# Patient Record
Sex: Female | Born: 1940
Health system: Southern US, Community
[De-identification: ages and names within clinical notes are randomized; demographics above are authoritative.]

## PROBLEM LIST (undated history)

## (undated) DIAGNOSIS — E039 Hypothyroidism, unspecified: Secondary | ICD-10-CM

## (undated) DIAGNOSIS — E785 Hyperlipidemia, unspecified: Secondary | ICD-10-CM

## (undated) DIAGNOSIS — K7581 Nonalcoholic steatohepatitis (NASH): Secondary | ICD-10-CM

## (undated) DIAGNOSIS — C50919 Malignant neoplasm of unspecified site of unspecified female breast: Secondary | ICD-10-CM

## (undated) DIAGNOSIS — I10 Essential (primary) hypertension: Secondary | ICD-10-CM

## (undated) DIAGNOSIS — Z973 Presence of spectacles and contact lenses: Secondary | ICD-10-CM

## (undated) DIAGNOSIS — Z862 Personal history of diseases of the blood and blood-forming organs and certain disorders involving the immune mechanism: Secondary | ICD-10-CM

## (undated) DIAGNOSIS — M8580 Other specified disorders of bone density and structure, unspecified site: Secondary | ICD-10-CM

## (undated) DIAGNOSIS — K219 Gastro-esophageal reflux disease without esophagitis: Secondary | ICD-10-CM

## (undated) DIAGNOSIS — Z9221 Personal history of antineoplastic chemotherapy: Secondary | ICD-10-CM

## (undated) DIAGNOSIS — M797 Fibromyalgia: Secondary | ICD-10-CM

## (undated) DIAGNOSIS — Z923 Personal history of irradiation: Secondary | ICD-10-CM

## (undated) DIAGNOSIS — Z8619 Personal history of other infectious and parasitic diseases: Secondary | ICD-10-CM

## (undated) DIAGNOSIS — K635 Polyp of colon: Secondary | ICD-10-CM

## (undated) DIAGNOSIS — K589 Irritable bowel syndrome without diarrhea: Secondary | ICD-10-CM

## (undated) DIAGNOSIS — M1990 Unspecified osteoarthritis, unspecified site: Secondary | ICD-10-CM

## (undated) DIAGNOSIS — F419 Anxiety disorder, unspecified: Secondary | ICD-10-CM

## (undated) HISTORY — DX: Unspecified osteoarthritis, unspecified site: M19.90

## (undated) HISTORY — DX: Polyp of colon: K63.5

## (undated) HISTORY — DX: Other specified disorders of bone density and structure, unspecified site: M85.80

## (undated) HISTORY — DX: Hyperlipidemia, unspecified: E78.5

## (undated) HISTORY — DX: Irritable bowel syndrome, unspecified: K58.9

## (undated) HISTORY — DX: Anxiety disorder, unspecified: F41.9

## (undated) HISTORY — DX: Malignant neoplasm of unspecified site of unspecified female breast: C50.919

## (undated) HISTORY — DX: Fibromyalgia: M79.7

## (undated) HISTORY — DX: Gastro-esophageal reflux disease without esophagitis: K21.9

## (undated) HISTORY — DX: Personal history of diseases of the blood and blood-forming organs and certain disorders involving the immune mechanism: Z86.2

## (undated) HISTORY — PX: APPENDECTOMY: SHX54

## (undated) HISTORY — PX: ABDOMINAL HYSTERECTOMY: SHX81

## (undated) HISTORY — DX: Personal history of other infectious and parasitic diseases: Z86.19

## (undated) HISTORY — DX: Nonalcoholic steatohepatitis (NASH): K75.81

## (undated) HISTORY — DX: Hypothyroidism, unspecified: E03.9

## (undated) HISTORY — PX: LAPAROSCOPIC CHOLECYSTECTOMY: SUR755

## (undated) HISTORY — PX: ROTATOR CUFF REPAIR: SHX139

## (undated) HISTORY — DX: Essential (primary) hypertension: I10

---

## 1976-09-29 HISTORY — PX: BILATERAL SALPINGOOPHORECTOMY: SHX1223

## 1998-03-30 ENCOUNTER — Ambulatory Visit (HOSPITAL_COMMUNITY): Admission: RE | Admit: 1998-03-30 | Discharge: 1998-03-30 | Payer: Self-pay | Admitting: Gastroenterology

## 1999-04-23 ENCOUNTER — Ambulatory Visit (HOSPITAL_COMMUNITY): Admission: RE | Admit: 1999-04-23 | Discharge: 1999-04-23 | Payer: Self-pay | Admitting: Gastroenterology

## 1999-05-01 ENCOUNTER — Encounter: Payer: Self-pay | Admitting: Gastroenterology

## 1999-05-01 ENCOUNTER — Ambulatory Visit (HOSPITAL_COMMUNITY): Admission: RE | Admit: 1999-05-01 | Discharge: 1999-05-01 | Payer: Self-pay | Admitting: Gastroenterology

## 1999-07-08 ENCOUNTER — Encounter: Admission: RE | Admit: 1999-07-08 | Discharge: 1999-10-06 | Payer: Self-pay | Admitting: Specialist

## 1999-08-16 ENCOUNTER — Encounter: Admission: RE | Admit: 1999-08-16 | Discharge: 1999-08-16 | Payer: Self-pay | Admitting: Obstetrics and Gynecology

## 1999-12-11 ENCOUNTER — Other Ambulatory Visit: Admission: RE | Admit: 1999-12-11 | Discharge: 1999-12-11 | Payer: Self-pay | Admitting: Obstetrics and Gynecology

## 2000-11-27 ENCOUNTER — Encounter: Admission: RE | Admit: 2000-11-27 | Discharge: 2000-11-27 | Payer: Self-pay | Admitting: Obstetrics and Gynecology

## 2000-11-27 ENCOUNTER — Encounter: Payer: Self-pay | Admitting: Obstetrics and Gynecology

## 2001-02-11 ENCOUNTER — Encounter: Payer: Self-pay | Admitting: Gastroenterology

## 2001-02-11 ENCOUNTER — Ambulatory Visit (HOSPITAL_COMMUNITY): Admission: RE | Admit: 2001-02-11 | Discharge: 2001-02-11 | Payer: Self-pay | Admitting: Gastroenterology

## 2001-02-24 ENCOUNTER — Encounter (INDEPENDENT_AMBULATORY_CARE_PROVIDER_SITE_OTHER): Payer: Self-pay | Admitting: Specialist

## 2001-02-24 ENCOUNTER — Encounter: Payer: Self-pay | Admitting: Internal Medicine

## 2001-02-24 ENCOUNTER — Ambulatory Visit (HOSPITAL_COMMUNITY): Admission: RE | Admit: 2001-02-24 | Discharge: 2001-02-24 | Payer: Self-pay | Admitting: Gastroenterology

## 2001-03-26 ENCOUNTER — Other Ambulatory Visit: Admission: RE | Admit: 2001-03-26 | Discharge: 2001-03-26 | Payer: Self-pay | Admitting: Obstetrics and Gynecology

## 2001-12-21 ENCOUNTER — Encounter: Payer: Self-pay | Admitting: Specialist

## 2001-12-24 ENCOUNTER — Observation Stay (HOSPITAL_COMMUNITY): Admission: RE | Admit: 2001-12-24 | Discharge: 2001-12-25 | Payer: Self-pay | Admitting: Specialist

## 2002-03-11 ENCOUNTER — Encounter: Payer: Self-pay | Admitting: Obstetrics and Gynecology

## 2002-03-11 ENCOUNTER — Encounter: Admission: RE | Admit: 2002-03-11 | Discharge: 2002-03-11 | Payer: Self-pay | Admitting: Obstetrics and Gynecology

## 2003-02-15 ENCOUNTER — Encounter: Payer: Self-pay | Admitting: Pulmonary Disease

## 2003-02-15 ENCOUNTER — Ambulatory Visit (HOSPITAL_COMMUNITY): Admission: RE | Admit: 2003-02-15 | Discharge: 2003-02-15 | Payer: Self-pay | Admitting: Pulmonary Disease

## 2003-02-17 ENCOUNTER — Ambulatory Visit (HOSPITAL_COMMUNITY): Admission: RE | Admit: 2003-02-17 | Discharge: 2003-02-17 | Payer: Self-pay | Admitting: Pulmonary Disease

## 2003-02-17 ENCOUNTER — Encounter: Payer: Self-pay | Admitting: Pulmonary Disease

## 2003-03-03 ENCOUNTER — Encounter: Admission: RE | Admit: 2003-03-03 | Discharge: 2003-03-03 | Payer: Self-pay | Admitting: Obstetrics and Gynecology

## 2003-03-03 ENCOUNTER — Encounter: Payer: Self-pay | Admitting: Obstetrics and Gynecology

## 2003-03-10 ENCOUNTER — Observation Stay (HOSPITAL_COMMUNITY): Admission: RE | Admit: 2003-03-10 | Discharge: 2003-03-11 | Payer: Self-pay | Admitting: General Surgery

## 2003-03-10 ENCOUNTER — Encounter: Payer: Self-pay | Admitting: General Surgery

## 2003-03-10 ENCOUNTER — Encounter (INDEPENDENT_AMBULATORY_CARE_PROVIDER_SITE_OTHER): Payer: Self-pay | Admitting: *Deleted

## 2003-09-30 DIAGNOSIS — Z9221 Personal history of antineoplastic chemotherapy: Secondary | ICD-10-CM

## 2003-09-30 DIAGNOSIS — Z923 Personal history of irradiation: Secondary | ICD-10-CM

## 2003-09-30 HISTORY — DX: Personal history of irradiation: Z92.3

## 2003-09-30 HISTORY — PX: BREAST LUMPECTOMY: SHX2

## 2003-09-30 HISTORY — DX: Personal history of antineoplastic chemotherapy: Z92.21

## 2003-11-30 ENCOUNTER — Encounter: Payer: Self-pay | Admitting: Internal Medicine

## 2004-03-15 ENCOUNTER — Encounter: Admission: RE | Admit: 2004-03-15 | Discharge: 2004-03-15 | Payer: Self-pay | Admitting: Obstetrics and Gynecology

## 2004-03-19 ENCOUNTER — Encounter (INDEPENDENT_AMBULATORY_CARE_PROVIDER_SITE_OTHER): Payer: Self-pay | Admitting: *Deleted

## 2004-03-19 ENCOUNTER — Encounter: Admission: RE | Admit: 2004-03-19 | Discharge: 2004-03-19 | Payer: Self-pay | Admitting: Obstetrics and Gynecology

## 2004-03-26 ENCOUNTER — Encounter (HOSPITAL_COMMUNITY): Admission: RE | Admit: 2004-03-26 | Discharge: 2004-06-24 | Payer: Self-pay | Admitting: General Surgery

## 2004-04-03 ENCOUNTER — Encounter: Admission: RE | Admit: 2004-04-03 | Discharge: 2004-04-03 | Payer: Self-pay | Admitting: General Surgery

## 2004-04-05 ENCOUNTER — Encounter (INDEPENDENT_AMBULATORY_CARE_PROVIDER_SITE_OTHER): Payer: Self-pay | Admitting: Specialist

## 2004-04-05 ENCOUNTER — Ambulatory Visit (HOSPITAL_BASED_OUTPATIENT_CLINIC_OR_DEPARTMENT_OTHER): Admission: RE | Admit: 2004-04-05 | Discharge: 2004-04-05 | Payer: Self-pay | Admitting: General Surgery

## 2004-04-05 ENCOUNTER — Ambulatory Visit (HOSPITAL_COMMUNITY): Admission: RE | Admit: 2004-04-05 | Discharge: 2004-04-05 | Payer: Self-pay | Admitting: General Surgery

## 2004-04-05 ENCOUNTER — Encounter: Admission: RE | Admit: 2004-04-05 | Discharge: 2004-04-05 | Payer: Self-pay | Admitting: General Surgery

## 2004-04-22 ENCOUNTER — Ambulatory Visit (HOSPITAL_COMMUNITY): Admission: RE | Admit: 2004-04-22 | Discharge: 2004-04-22 | Payer: Self-pay | Admitting: Oncology

## 2004-04-23 ENCOUNTER — Ambulatory Visit: Admission: RE | Admit: 2004-04-23 | Discharge: 2004-06-12 | Payer: Self-pay | Admitting: Radiation Oncology

## 2004-04-30 ENCOUNTER — Ambulatory Visit (HOSPITAL_COMMUNITY): Admission: RE | Admit: 2004-04-30 | Discharge: 2004-04-30 | Payer: Self-pay | Admitting: Cardiovascular Disease

## 2004-05-13 ENCOUNTER — Ambulatory Visit (HOSPITAL_BASED_OUTPATIENT_CLINIC_OR_DEPARTMENT_OTHER): Admission: RE | Admit: 2004-05-13 | Discharge: 2004-05-13 | Payer: Self-pay | Admitting: General Surgery

## 2004-05-13 ENCOUNTER — Ambulatory Visit (HOSPITAL_COMMUNITY): Admission: RE | Admit: 2004-05-13 | Discharge: 2004-05-13 | Payer: Self-pay | Admitting: General Surgery

## 2004-07-29 ENCOUNTER — Ambulatory Visit: Payer: Self-pay | Admitting: Oncology

## 2004-08-08 ENCOUNTER — Ambulatory Visit: Payer: Self-pay | Admitting: Cardiovascular Disease

## 2004-08-08 ENCOUNTER — Ambulatory Visit (HOSPITAL_COMMUNITY): Admission: RE | Admit: 2004-08-08 | Discharge: 2004-08-08 | Payer: Self-pay | Admitting: Cardiovascular Disease

## 2004-08-12 ENCOUNTER — Ambulatory Visit: Admission: RE | Admit: 2004-08-12 | Discharge: 2004-10-25 | Payer: Self-pay | Admitting: Radiation Oncology

## 2004-09-02 ENCOUNTER — Ambulatory Visit: Payer: Self-pay | Admitting: Pulmonary Disease

## 2004-09-09 ENCOUNTER — Ambulatory Visit: Payer: Self-pay | Admitting: Pulmonary Disease

## 2004-10-01 ENCOUNTER — Ambulatory Visit: Payer: Self-pay | Admitting: Oncology

## 2004-10-24 ENCOUNTER — Ambulatory Visit: Admission: RE | Admit: 2004-10-24 | Discharge: 2004-10-24 | Payer: Self-pay | Admitting: Oncology

## 2004-10-24 ENCOUNTER — Encounter (INDEPENDENT_AMBULATORY_CARE_PROVIDER_SITE_OTHER): Payer: Self-pay | Admitting: *Deleted

## 2004-11-01 ENCOUNTER — Ambulatory Visit: Payer: Self-pay | Admitting: Pulmonary Disease

## 2004-11-15 ENCOUNTER — Ambulatory Visit: Payer: Self-pay | Admitting: Internal Medicine

## 2004-11-18 ENCOUNTER — Ambulatory Visit: Payer: Self-pay | Admitting: Internal Medicine

## 2004-11-21 ENCOUNTER — Ambulatory Visit: Admission: RE | Admit: 2004-11-21 | Discharge: 2004-11-21 | Payer: Self-pay | Admitting: Radiation Oncology

## 2004-12-02 ENCOUNTER — Ambulatory Visit: Payer: Self-pay | Admitting: Oncology

## 2005-01-30 ENCOUNTER — Ambulatory Visit: Payer: Self-pay | Admitting: Oncology

## 2005-03-17 ENCOUNTER — Encounter: Admission: RE | Admit: 2005-03-17 | Discharge: 2005-03-17 | Payer: Self-pay | Admitting: General Surgery

## 2005-03-17 ENCOUNTER — Ambulatory Visit: Payer: Self-pay | Admitting: Oncology

## 2005-04-24 ENCOUNTER — Ambulatory Visit (HOSPITAL_BASED_OUTPATIENT_CLINIC_OR_DEPARTMENT_OTHER): Admission: RE | Admit: 2005-04-24 | Discharge: 2005-04-24 | Payer: Self-pay | Admitting: General Surgery

## 2005-05-26 ENCOUNTER — Ambulatory Visit: Payer: Self-pay | Admitting: Oncology

## 2005-05-28 ENCOUNTER — Ambulatory Visit: Admission: RE | Admit: 2005-05-28 | Discharge: 2005-05-28 | Payer: Self-pay | Admitting: Oncology

## 2005-05-28 ENCOUNTER — Ambulatory Visit: Payer: Self-pay | Admitting: Cardiovascular Disease

## 2005-05-28 ENCOUNTER — Encounter: Payer: Self-pay | Admitting: Cardiology

## 2005-07-11 ENCOUNTER — Ambulatory Visit: Payer: Self-pay | Admitting: Oncology

## 2005-10-08 ENCOUNTER — Ambulatory Visit: Payer: Self-pay | Admitting: Oncology

## 2005-11-24 ENCOUNTER — Ambulatory Visit: Payer: Self-pay | Admitting: Pulmonary Disease

## 2006-01-08 ENCOUNTER — Ambulatory Visit: Payer: Self-pay | Admitting: Oncology

## 2006-01-08 LAB — CBC WITH DIFFERENTIAL/PLATELET
Basophils Absolute: 0.1 10*3/uL (ref 0.0–0.1)
Eosinophils Absolute: 0.2 10*3/uL (ref 0.0–0.5)
HCT: 40 % (ref 34.8–46.6)
HGB: 13.6 g/dL (ref 11.6–15.9)
MCV: 84 fL (ref 81.0–101.0)
NEUT#: 7 10*3/uL — ABNORMAL HIGH (ref 1.5–6.5)
RDW: 14.3 % (ref 11.3–14.5)
lymph#: 2.6 10*3/uL (ref 0.9–3.3)

## 2006-03-19 ENCOUNTER — Encounter: Admission: RE | Admit: 2006-03-19 | Discharge: 2006-03-19 | Payer: Self-pay | Admitting: Oncology

## 2006-07-02 ENCOUNTER — Ambulatory Visit: Payer: Self-pay | Admitting: Oncology

## 2006-07-06 LAB — CBC WITH DIFFERENTIAL/PLATELET
Basophils Absolute: 0.1 10*3/uL (ref 0.0–0.1)
EOS%: 1.5 % (ref 0.0–7.0)
HCT: 37.5 % (ref 34.8–46.6)
HGB: 12.9 g/dL (ref 11.6–15.9)
LYMPH%: 21.3 % (ref 14.0–48.0)
MCH: 28.8 pg (ref 26.0–34.0)
MCHC: 34.3 g/dL (ref 32.0–36.0)
MCV: 84 fL (ref 81.0–101.0)
MONO%: 9 % (ref 0.0–13.0)
NEUT%: 67.3 % (ref 39.6–76.8)
Platelets: 321 10*3/uL (ref 145–400)
lymph#: 1.9 10*3/uL (ref 0.9–3.3)

## 2006-07-06 LAB — COMPREHENSIVE METABOLIC PANEL
AST: 130 U/L — ABNORMAL HIGH (ref 0–37)
BUN: 12 mg/dL (ref 6–23)
Calcium: 8.8 mg/dL (ref 8.4–10.5)
Chloride: 105 mEq/L (ref 96–112)
Creatinine, Ser: 0.73 mg/dL (ref 0.40–1.20)
Total Bilirubin: 0.5 mg/dL (ref 0.3–1.2)

## 2006-07-17 ENCOUNTER — Encounter: Admission: RE | Admit: 2006-07-17 | Discharge: 2006-07-17 | Payer: Self-pay | Admitting: Oncology

## 2006-10-13 ENCOUNTER — Ambulatory Visit: Payer: Self-pay | Admitting: Pulmonary Disease

## 2006-10-13 LAB — CONVERTED CEMR LAB
AST: 105 units/L — ABNORMAL HIGH (ref 0–37)
Albumin: 3.8 g/dL (ref 3.5–5.2)
Alkaline Phosphatase: 56 units/L (ref 39–117)
BUN: 14 mg/dL (ref 6–23)
Basophils Absolute: 0 10*3/uL (ref 0.0–0.1)
Basophils Relative: 0.4 % (ref 0.0–1.0)
CO2: 24 meq/L (ref 19–32)
Calcium: 9.5 mg/dL (ref 8.4–10.5)
Direct LDL: 141.5 mg/dL
Eosinophils Relative: 1 % (ref 0.0–5.0)
HCT: 39.2 % (ref 36.0–46.0)
Hemoglobin: 13.5 g/dL (ref 12.0–15.0)
Lymphocytes Relative: 18.1 % (ref 12.0–46.0)
Neutrophils Relative %: 75.9 % (ref 43.0–77.0)
Potassium: 3.5 meq/L (ref 3.5–5.1)
RDW: 13.2 % (ref 11.5–14.6)
TSH: 6.67 microintl units/mL — ABNORMAL HIGH (ref 0.35–5.50)
Total Bilirubin: 1.1 mg/dL (ref 0.3–1.2)
Total Protein: 7.6 g/dL (ref 6.0–8.3)
Triglycerides: 215 mg/dL (ref 0–149)
Vit D, 1,25-Dihydroxy: 47 (ref 20–57)
WBC: 10.7 10*3/uL — ABNORMAL HIGH (ref 4.5–10.5)

## 2006-10-23 ENCOUNTER — Ambulatory Visit: Payer: Self-pay | Admitting: Internal Medicine

## 2006-10-27 ENCOUNTER — Ambulatory Visit: Payer: Self-pay | Admitting: Gastroenterology

## 2006-11-11 ENCOUNTER — Encounter (INDEPENDENT_AMBULATORY_CARE_PROVIDER_SITE_OTHER): Payer: Self-pay | Admitting: Specialist

## 2006-11-11 ENCOUNTER — Ambulatory Visit (HOSPITAL_COMMUNITY): Admission: RE | Admit: 2006-11-11 | Discharge: 2006-11-11 | Payer: Self-pay | Admitting: Internal Medicine

## 2006-11-11 ENCOUNTER — Encounter: Payer: Self-pay | Admitting: Internal Medicine

## 2006-12-01 ENCOUNTER — Ambulatory Visit: Payer: Self-pay | Admitting: Internal Medicine

## 2006-12-01 LAB — CONVERTED CEMR LAB
Albumin: 3.5 g/dL (ref 3.5–5.2)
TSH: 3.93 microintl units/mL (ref 0.35–5.50)
Total Bilirubin: 0.5 mg/dL (ref 0.3–1.2)

## 2007-01-13 ENCOUNTER — Ambulatory Visit: Payer: Self-pay | Admitting: Internal Medicine

## 2007-01-13 LAB — CONVERTED CEMR LAB
AST: 105 units/L — ABNORMAL HIGH (ref 0–37)
Albumin: 3.4 g/dL — ABNORMAL LOW (ref 3.5–5.2)
Alkaline Phosphatase: 56 units/L (ref 39–117)

## 2007-02-25 ENCOUNTER — Ambulatory Visit: Payer: Self-pay | Admitting: Oncology

## 2007-03-01 LAB — COMPREHENSIVE METABOLIC PANEL
ALT: 99 U/L — ABNORMAL HIGH (ref 0–35)
AST: 119 U/L — ABNORMAL HIGH (ref 0–37)
Albumin: 4.2 g/dL (ref 3.5–5.2)
Alkaline Phosphatase: 55 U/L (ref 39–117)
Potassium: 4.2 mEq/L (ref 3.5–5.3)
Sodium: 140 mEq/L (ref 135–145)
Total Protein: 7.2 g/dL (ref 6.0–8.3)

## 2007-03-01 LAB — CBC WITH DIFFERENTIAL/PLATELET
Basophils Absolute: 0 10*3/uL (ref 0.0–0.1)
Eosinophils Absolute: 0.2 10*3/uL (ref 0.0–0.5)
HGB: 12.8 g/dL (ref 11.6–15.9)
LYMPH%: 24.1 % (ref 14.0–48.0)
MCV: 83 fL (ref 81.0–101.0)
MONO%: 9.4 % (ref 0.0–13.0)
NEUT#: 5.4 10*3/uL (ref 1.5–6.5)
NEUT%: 64 % (ref 39.6–76.8)
Platelets: 318 10*3/uL (ref 145–400)

## 2007-03-22 ENCOUNTER — Encounter: Admission: RE | Admit: 2007-03-22 | Discharge: 2007-03-22 | Payer: Self-pay | Admitting: Pulmonary Disease

## 2007-09-17 DIAGNOSIS — IMO0001 Reserved for inherently not codable concepts without codable children: Secondary | ICD-10-CM | POA: Insufficient documentation

## 2007-09-17 DIAGNOSIS — I10 Essential (primary) hypertension: Secondary | ICD-10-CM | POA: Insufficient documentation

## 2007-09-17 DIAGNOSIS — F411 Generalized anxiety disorder: Secondary | ICD-10-CM | POA: Insufficient documentation

## 2007-09-17 DIAGNOSIS — K219 Gastro-esophageal reflux disease without esophagitis: Secondary | ICD-10-CM | POA: Insufficient documentation

## 2007-09-17 DIAGNOSIS — E785 Hyperlipidemia, unspecified: Secondary | ICD-10-CM | POA: Insufficient documentation

## 2007-09-17 DIAGNOSIS — K589 Irritable bowel syndrome without diarrhea: Secondary | ICD-10-CM | POA: Insufficient documentation

## 2007-10-21 ENCOUNTER — Ambulatory Visit: Payer: Self-pay | Admitting: Pulmonary Disease

## 2007-10-21 DIAGNOSIS — M899 Disorder of bone, unspecified: Secondary | ICD-10-CM | POA: Insufficient documentation

## 2007-10-21 DIAGNOSIS — C50919 Malignant neoplasm of unspecified site of unspecified female breast: Secondary | ICD-10-CM | POA: Insufficient documentation

## 2007-10-21 DIAGNOSIS — M1990 Unspecified osteoarthritis, unspecified site: Secondary | ICD-10-CM | POA: Insufficient documentation

## 2007-10-21 DIAGNOSIS — D649 Anemia, unspecified: Secondary | ICD-10-CM | POA: Insufficient documentation

## 2007-10-21 DIAGNOSIS — Z853 Personal history of malignant neoplasm of breast: Secondary | ICD-10-CM | POA: Insufficient documentation

## 2007-10-21 DIAGNOSIS — M949 Disorder of cartilage, unspecified: Secondary | ICD-10-CM

## 2007-10-21 DIAGNOSIS — E039 Hypothyroidism, unspecified: Secondary | ICD-10-CM | POA: Insufficient documentation

## 2007-10-21 HISTORY — DX: Malignant neoplasm of unspecified site of unspecified female breast: C50.919

## 2007-10-29 LAB — CONVERTED CEMR LAB
ALT: 109 units/L — ABNORMAL HIGH (ref 0–35)
AST: 120 units/L — ABNORMAL HIGH (ref 0–37)
Alkaline Phosphatase: 57 units/L (ref 39–117)
BUN: 12 mg/dL (ref 6–23)
Basophils Relative: 0.2 % (ref 0.0–1.0)
CO2: 26 meq/L (ref 19–32)
Calcium: 9.7 mg/dL (ref 8.4–10.5)
Chloride: 104 meq/L (ref 96–112)
Cholesterol: 230 mg/dL (ref 0–200)
Creatinine, Ser: 0.7 mg/dL (ref 0.4–1.2)
Eosinophils Relative: 1.2 % (ref 0.0–5.0)
HDL: 46.1 mg/dL (ref >39.0)
Hemoglobin: 13.2 g/dL (ref 12.0–15.0)
Monocytes Relative: 7.7 % (ref 3.0–11.0)
Platelets: 308 10*3/uL (ref 150–400)
RDW: 13.3 % (ref 11.5–14.6)
Total Bilirubin: 0.8 mg/dL (ref 0.3–1.2)
Total CHOL/HDL Ratio: 5
Total Protein: 7.7 g/dL (ref 6.0–8.3)
VLDL: 41 mg/dL — ABNORMAL HIGH (ref 0–40)
WBC: 10.1 10*3/uL (ref 4.5–10.5)

## 2007-11-11 ENCOUNTER — Telehealth (INDEPENDENT_AMBULATORY_CARE_PROVIDER_SITE_OTHER): Payer: Self-pay | Admitting: *Deleted

## 2008-02-22 ENCOUNTER — Ambulatory Visit: Payer: Self-pay | Admitting: Internal Medicine

## 2008-02-22 ENCOUNTER — Telehealth (INDEPENDENT_AMBULATORY_CARE_PROVIDER_SITE_OTHER): Payer: Self-pay | Admitting: *Deleted

## 2008-03-22 ENCOUNTER — Encounter: Admission: RE | Admit: 2008-03-22 | Discharge: 2008-03-22 | Payer: Self-pay | Admitting: Oncology

## 2008-04-03 ENCOUNTER — Ambulatory Visit: Payer: Self-pay | Admitting: Oncology

## 2008-04-05 LAB — CBC WITH DIFFERENTIAL/PLATELET
Basophils Absolute: 0 10*3/uL (ref 0.0–0.1)
EOS%: 1.8 % (ref 0.0–7.0)
Eosinophils Absolute: 0.2 10*3/uL (ref 0.0–0.5)
HCT: 38.3 % (ref 34.8–46.6)
HGB: 13.2 g/dL (ref 11.6–15.9)
LYMPH%: 20.9 % (ref 14.0–48.0)
MCH: 28.4 pg (ref 26.0–34.0)
MCV: 82.3 fL (ref 81.0–101.0)
MONO%: 8.3 % (ref 0.0–13.0)
NEUT#: 5.8 10*3/uL (ref 1.5–6.5)
NEUT%: 68.6 % (ref 39.6–76.8)
Platelets: 285 10*3/uL (ref 145–400)

## 2008-04-06 LAB — COMPREHENSIVE METABOLIC PANEL
AST: 110 U/L — ABNORMAL HIGH (ref 0–37)
Albumin: 4.1 g/dL (ref 3.5–5.2)
Alkaline Phosphatase: 53 U/L (ref 39–117)
BUN: 10 mg/dL (ref 6–23)
Creatinine, Ser: 0.74 mg/dL (ref 0.40–1.20)
Glucose, Bld: 102 mg/dL — ABNORMAL HIGH (ref 70–99)

## 2008-04-12 ENCOUNTER — Encounter: Payer: Self-pay | Admitting: Pulmonary Disease

## 2008-07-18 ENCOUNTER — Encounter: Admission: RE | Admit: 2008-07-18 | Discharge: 2008-07-18 | Payer: Self-pay | Admitting: Oncology

## 2008-12-08 ENCOUNTER — Ambulatory Visit: Payer: Self-pay | Admitting: Pulmonary Disease

## 2008-12-10 DIAGNOSIS — B029 Zoster without complications: Secondary | ICD-10-CM | POA: Insufficient documentation

## 2008-12-11 ENCOUNTER — Telehealth (INDEPENDENT_AMBULATORY_CARE_PROVIDER_SITE_OTHER): Payer: Self-pay | Admitting: *Deleted

## 2008-12-11 LAB — CONVERTED CEMR LAB
ALT: 119 units/L — ABNORMAL HIGH (ref 0–35)
AST: 133 units/L — ABNORMAL HIGH (ref 0–37)
Basophils Absolute: 0.1 10*3/uL (ref 0.0–0.1)
Basophils Relative: 0.9 % (ref 0.0–3.0)
Bilirubin, Direct: 0.2 mg/dL (ref 0.0–0.3)
CO2: 25 meq/L (ref 19–32)
Chloride: 105 meq/L (ref 96–112)
Cholesterol: 188 mg/dL (ref 0–200)
Creatinine, Ser: 0.8 mg/dL (ref 0.4–1.2)
LDL Cholesterol: 101 mg/dL — ABNORMAL HIGH (ref 0–99)
Lymphocytes Relative: 20.6 % (ref 12.0–46.0)
MCHC: 34.3 g/dL (ref 30.0–36.0)
Neutrophils Relative %: 69.3 % (ref 43.0–77.0)
RBC: 4.46 M/uL (ref 3.87–5.11)
TSH: 3.48 microintl units/mL (ref 0.35–5.50)
Total Bilirubin: 0.8 mg/dL (ref 0.3–1.2)
Total CHOL/HDL Ratio: 3.7
VLDL: 36 mg/dL (ref 0–40)
WBC: 9 10*3/uL (ref 4.5–10.5)

## 2009-03-23 ENCOUNTER — Encounter: Admission: RE | Admit: 2009-03-23 | Discharge: 2009-03-23 | Payer: Self-pay | Admitting: Oncology

## 2009-04-10 ENCOUNTER — Ambulatory Visit: Payer: Self-pay | Admitting: Oncology

## 2009-04-12 LAB — CBC WITH DIFFERENTIAL/PLATELET
Eosinophils Absolute: 0.2 10*3/uL (ref 0.0–0.5)
HGB: 13.3 g/dL (ref 11.6–15.9)
LYMPH%: 23.7 % (ref 14.0–49.7)
MONO#: 0.9 10*3/uL (ref 0.1–0.9)
NEUT#: 5.8 10*3/uL (ref 1.5–6.5)
Platelets: 269 10*3/uL (ref 145–400)
RBC: 4.68 10*6/uL (ref 3.70–5.45)
RDW: 15.2 % — ABNORMAL HIGH (ref 11.2–14.5)
WBC: 9.1 10*3/uL (ref 3.9–10.3)

## 2009-04-12 LAB — COMPREHENSIVE METABOLIC PANEL
ALT: 129 U/L — ABNORMAL HIGH (ref 0–35)
AST: 138 U/L — ABNORMAL HIGH (ref 0–37)
Alkaline Phosphatase: 56 U/L (ref 39–117)
Calcium: 9.5 mg/dL (ref 8.4–10.5)
Chloride: 105 mEq/L (ref 96–112)
Creatinine, Ser: 0.78 mg/dL (ref 0.40–1.20)

## 2009-04-19 ENCOUNTER — Encounter: Payer: Self-pay | Admitting: Internal Medicine

## 2009-11-12 ENCOUNTER — Telehealth (INDEPENDENT_AMBULATORY_CARE_PROVIDER_SITE_OTHER): Payer: Self-pay

## 2009-11-27 ENCOUNTER — Ambulatory Visit: Payer: Self-pay | Admitting: Internal Medicine

## 2009-11-27 ENCOUNTER — Encounter (INDEPENDENT_AMBULATORY_CARE_PROVIDER_SITE_OTHER): Payer: Self-pay | Admitting: *Deleted

## 2009-11-27 DIAGNOSIS — Z860100 Personal history of colon polyps, unspecified: Secondary | ICD-10-CM | POA: Insufficient documentation

## 2009-11-27 DIAGNOSIS — K760 Fatty (change of) liver, not elsewhere classified: Secondary | ICD-10-CM | POA: Insufficient documentation

## 2009-11-30 ENCOUNTER — Encounter: Payer: Self-pay | Admitting: Internal Medicine

## 2009-11-30 LAB — CONVERTED CEMR LAB
AST: 144 units/L — ABNORMAL HIGH (ref 0–37)
Alkaline Phosphatase: 57 units/L (ref 39–117)
BUN: 9 mg/dL (ref 6–23)
Basophils Relative: 0.3 % (ref 0.0–3.0)
Creatinine, Ser: 0.7 mg/dL (ref 0.4–1.2)
Eosinophils Absolute: 0.2 10*3/uL (ref 0.0–0.7)
Hemoglobin: 12.9 g/dL (ref 12.0–15.0)
MCHC: 33.5 g/dL (ref 30.0–36.0)
MCV: 85.8 fL (ref 78.0–100.0)
Monocytes Absolute: 0.8 10*3/uL (ref 0.1–1.0)
Neutro Abs: 5.9 10*3/uL (ref 1.4–7.7)
RBC: 4.48 M/uL (ref 3.87–5.11)
Total Bilirubin: 0.7 mg/dL (ref 0.3–1.2)

## 2009-12-06 ENCOUNTER — Ambulatory Visit: Payer: Self-pay | Admitting: Internal Medicine

## 2010-01-10 ENCOUNTER — Ambulatory Visit: Payer: Self-pay | Admitting: Pulmonary Disease

## 2010-01-13 LAB — CONVERTED CEMR LAB
Basophils Absolute: 0.1 10*3/uL (ref 0.0–0.1)
Cholesterol: 201 mg/dL — ABNORMAL HIGH (ref 0–200)
HCT: 37.8 % (ref 36.0–46.0)
Lymphs Abs: 2.3 10*3/uL (ref 0.7–4.0)
Monocytes Relative: 5.7 % (ref 3.0–12.0)
Platelets: 265 10*3/uL (ref 150.0–400.0)
RDW: 15.1 % — ABNORMAL HIGH (ref 11.5–14.6)
TSH: 4.09 microintl units/mL (ref 0.35–5.50)
Total CHOL/HDL Ratio: 4
Triglycerides: 193 mg/dL — ABNORMAL HIGH (ref 0.0–149.0)
VLDL: 38.6 mg/dL (ref 0.0–40.0)

## 2010-03-25 ENCOUNTER — Encounter: Admission: RE | Admit: 2010-03-25 | Discharge: 2010-03-25 | Payer: Self-pay | Admitting: Oncology

## 2010-04-16 ENCOUNTER — Ambulatory Visit: Payer: Self-pay | Admitting: Oncology

## 2010-04-18 ENCOUNTER — Encounter: Payer: Self-pay | Admitting: Pulmonary Disease

## 2010-04-18 LAB — COMPREHENSIVE METABOLIC PANEL
ALT: 107 U/L — ABNORMAL HIGH (ref 0–35)
AST: 107 U/L — ABNORMAL HIGH (ref 0–37)
Albumin: 3.5 g/dL (ref 3.5–5.2)
Alkaline Phosphatase: 59 U/L (ref 39–117)
Potassium: 3.8 mEq/L (ref 3.5–5.3)
Sodium: 138 mEq/L (ref 135–145)
Total Protein: 7.1 g/dL (ref 6.0–8.3)

## 2010-04-18 LAB — CBC WITH DIFFERENTIAL/PLATELET
BASO%: 0.4 % (ref 0.0–2.0)
EOS%: 1.9 % (ref 0.0–7.0)
Eosinophils Absolute: 0.2 10*3/uL (ref 0.0–0.5)
MCH: 29.4 pg (ref 25.1–34.0)
MCHC: 34.6 g/dL (ref 31.5–36.0)
MCV: 85.2 fL (ref 79.5–101.0)
MONO%: 9.6 % (ref 0.0–14.0)
NEUT#: 5.8 10*3/uL (ref 1.5–6.5)
RBC: 4.49 10*6/uL (ref 3.70–5.45)
RDW: 14.7 % — ABNORMAL HIGH (ref 11.2–14.5)

## 2010-10-27 LAB — CONVERTED CEMR LAB
Alpha-2-Globulin: 11.3 % (ref 7.1–11.8)
Anti Nuclear Antibody(ANA): POSITIVE — AB
Beta Globulin: 8 % — ABNORMAL HIGH (ref 4.7–7.2)
Ferritin: 95.6 ng/mL (ref 10.0–291.0)
Gamma Globulin: 13.8 % (ref 11.1–18.8)
HCV Ab: NEGATIVE
Hepatitis B Surface Ag: NEGATIVE
Total Protein, Serum Electrophoresis: 7.8 g/dL (ref 6.0–8.3)

## 2010-10-29 NOTE — Letter (Signed)
[facility]   Imported By: Sherian Rein 05/13/2010 11:49:07  _____________________________________________________________________  External Attachment:    Type:   Image     Comment:   External Document

## 2010-10-29 NOTE — Miscellaneous (Signed)
Summary: endoscopies and path    EGD  Procedure date:  02/24/2001  Findings:      small hiatal hernia and normal duodenal bxs (no sprue)  Colonoscopy  Procedure date:  03/31/1996  Findings:      5 mm adenoma normal random biopsies and terminal ileum otherwise Dr. Marina Goodell  EGD  Procedure date:  03/31/1996  Findings:      normal duodenal bxs normal no sprue

## 2010-10-29 NOTE — Procedures (Signed)
Summary: Colonoscopy  Patient: [name] Note: All result statuses are Final unless otherwise noted.  Tests: (1) Colonoscopy (COL)   COL Colonoscopy           DONE     [facility]     [address]     [place], [place]  [address]           COLONOSCOPY PROCEDURE REPORT           PATIENT:  Kaitlyn, Keller  MR#:  [id number]     BIRTHDATE:  11/04/1940, 68 yrs. old  GENDER:  female           ENDOSCOPIST:  Iva Boop, MD, [facility]           PROCEDURE DATE:  12/06/2009     PROCEDURE:  Colonoscopy 65784     ASA CLASS:  Class II     INDICATIONS:  history of pre-cancerous (adenomatous) colon polyps,     surveillance and screening diminutive adenoma 1997     negative exams since           MEDICATIONS:   Fentanyl 75 mcg IV, Versed 9 mg           DESCRIPTION OF PROCEDURE:   After the risks benefits and     alternatives of the procedure were thoroughly explained, informed     consent was obtained.  Digital rectal exam was performed and     revealed no abnormalities.   The LB [id number] endoscope     was introduced through the anus and advanced to the cecum, which     was identified by both the appendix and ileocecal valve, without     limitations.  The quality of the prep was excellent, using     MoviPrep.  The instrument was then slowly withdrawn as the colon     was fully examined.     Insertion: 13:48 minutes Withdrawal: 12:28 minutes     <<PROCEDUREIMAGES>>           FINDINGS:  A normal appearing cecum, ileocecal valve, and     appendiceal orifice were identified. The ascending, hepatic     flexure, transverse, splenic flexure, descending, sigmoid colon,     and rectum appeared unremarkable as did right colon retroflexion     views.   Retroflexed views in the rectum revealed no     abnormalities.    The scope was then withdrawn from the patient     and the procedure completed.           COMPLICATIONS:  None           ENDOSCOPIC IMPRESSION:     1)  Normal colonoscopy, excwellent prep           2) history of diminutive adenoma removal 1997, no polyps since           RECOMMENDATIONS:     follow-up with Dr. Darnelle Catalan as planned and follow-up with me     after she stops Tamoxifen           REPEAT EXAM:  In 10 year(s) for routine screening colonocsopy.     can stop every 5 yrs as only had a diminutive adenoma in past           Iva Boop, MD, [facility]           CC:  Ruthann Cancer, MD     The Patient     Michele Mcalpine,  MD           n.     Rosalie DoctorIva Boop at 12/06/2009 10:01 AM           Welford Roche, [id number]  Note: An exclamation mark (!) indicates a result that was not dispersed into the flowsheet. Document Creation Date: 12/06/2009 10:01 AM _______________________________________________________________________  (1) Order result status: Final Collection or observation date-time: 12/06/2009 09:48 Requested date-time:  Receipt date-time:  Reported date-time:  Referring Physician:   Ordering Physician: Stan Head [phone]) Specimen Source:  Source: Launa Grill Order Number: [id number] Lab site:   Appended Document: Colonoscopy    Clinical Lists Changes  Observations: Added new observation of COLONNXTDUE: 11/2019 (12/06/2009 11:03)

## 2010-10-29 NOTE — Progress Notes (Signed)
Summary: schedule    ---- Converted from flag ---- ---- 11/11/2009 12:14 PM, Iva Boop MD, [facility] wrote: she has a colon recall due but would like to see her in office re: LFT's also please arrange and convert/create phone note and let Esmeraelda know ------------------------------  Phone Note Outgoing Call Call back at [facility] Phone [phone]   Call placed by: Darcey Nora RN, CGRN,  November 12, 2009 11:20 AM Call placed to: Patient Summary of Call: Left message for patient to call back to discuss scheduling an appointment   Follow-up for Phone Call        Left message for patient to call back Darcey Nora RN, [facility]  November 13, 2009 10:59 AM  patient scheduled for REV for 11-27-09 9:30 Darcey Nora RN, [facility]  November 14, 2009 9:22 AM

## 2010-10-29 NOTE — Assessment & Plan Note (Signed)
Summary: cpx//td--fasting   Primary Care Provider:  Alroy Dust, MD  CC:  Yearly ROV & review of mult medical problems....  History of Present Illness: 70 y/o WF here for a yearly follow up visit... she has numerous medical problems as listed below...    ~  2006-2008:  her LFT's were sigif elevated and we referred her to GI/ DrGessner... eval w/ liver biopsy favored Steatohepatitis and his notes are reviewed... she has Hyperlipidemia but was intol to Lipitor and Crestor in the past and has refused to try other meds, and has refused the lipid clinic- "I'm on diet alone"... her weight is stable in the 175-180# range, despite her diet + gym work outs.   ~  January 10, 2010:  Yearly ROV doing well- without new complaints or concerns... she notes BP controlled on Amlodipine;  she is content w/ level of control of Lipids on diet alone & refuses med Rx;  GI is stable w/ f/u colonoscopy 3/11 showing no polyps;  increased LFT's w/ steatohepatitis that DrGessner feels is from Tamoxifen- he has rec weight loss (pt unsuccessful), and due to finish 12yr tamoxifen later this yr (it will be interesting to see if LFT's improve off this med)... she refuses Flu vaccines.    Current Problems:   HYPERTENSION (ICD-401.9) - controlled on AMLODIPINE 5mg /d... BP=134/84 and similar at home... she denies HA, fatigue, visual changes, CP, palipit, dizziness, syncope, dyspnea, edema, etc...  ~  NuclearStressTest 1997 was normal- no ischemia, no infarct, EF= 73%...  HYPERLIPIDEMIA (ICD-272.4) - on diet + FISH OIL daily... hx intol to Lipitor & Crestor.  ~  FLP 1/08 showed TChol 216, TG 215, HDL 43, LDL 141... refused other statins or lipid clinic...  ~  FLP 1/09 (wt=180#) showed TChol 230, TG 207, HDL 46, LDL 147...  ~  FLP 3/10 (wt=177#) showed TChol 188, TG 181, HDL 51, LDL 101... improved on diet Rx.  ~  FLP 4/11 (wt=178#) showed TChol 201, TG 193, HDL 53, LDL 121  HYPOTHYROIDISM (ICD-244.9) - new Dx 1/08 w/  TSH=6.67... started on Synthroid 62mcg/d...  ~  labs 1/09 showed TSH= 2.97...  ~  labs 3/10 showed TSH= 3.48... continue same dose.  ~  labs 4/11 showed TSH= 4.09  GERD (ICD-530.81) - on PRILOSEC 20mg /d...  ~  last EGD 3/05 showed sm HH and tortuous esoph...  IRRITABLE BOWEL SYNDROME (ICD-564.1) COLONIC POLYPS (ICD-211.3) - first colonoscopy in 1997 w/ sm tubular adenoma removed...   ~  f/u colon done in 2000 and neg- no polyps seen...  ~  colonoscopy 2/06 by DrGessner showed hems only...  ~  colonoscopy 3/11by DrGessner was neg- no lesions seen... f/u planned 74yrs.  LIVER FUNCTION TESTS, ABNORMAL (ICD-794.8) - SEE ABOVE & eval by DrGessner... her LFT's were sigif elevated and eval w/ liver biopsy (2/08) favored Steatohepatitis and his notes are reviewed... she discussed it w/ DrMagrinat also... she is still on Tamoxifen...  ~  prev AbdSonar's back to 2000 showed echo-dense liver c/w fatty infiltration... CT Abd 2005 confirms.  ~  LFT's 1/09 showed SGOT= 120, SGPT= 109  ~  LFT's 3/10 showed SGOT= 133, SGPT= 119... referred back to GI & Tamoxifen targeted as poss culprit.  ~  LFTs 3/11 showed SGOT= 144, SGPT= 116  ADENOCARCINOMA, BREAST (ICD-174.9) - followed yearly by DrMagrinat... she had L breast lumpectomy 7/05 & LN biopsy, +XRT, & chemotherapy... now on tamoxifen & DrMagrinat's notes are reviewed...  DEGENERATIVE JOINT DISEASE (ICD-715.90)  FIBROMYALGIA (ICD-729.1)  OSTEOPENIA (ICD-733.90) -  BMD's per DrMagrinat and Hulda Humphrey... VitD level was 47 in 2008...  ANXIETY (ICD-300.00) - on ALPRAZOLAM 0.5mg Tid Prn...  Hx of SHINGLES (ICD-053.9) - she states she's had shingles twice- last 5/09 involving left T10 distrib... no signif post herpetic problems... we discussed the indication for the Shingles vaccine.  Hx of ANEMIA (ICD-285.9) - previous anemia resolved & Hg has been normal x yrs now.   Allergies: 1)  ! Codeine  Comments:  Nurse/Medical Assistant: The patient's  medications and allergies were reviewed with the patient and were updated in the Medication and Allergy Lists.  Past History:  Past Medical History:  HYPERTENSION (ICD-401.9) HYPERLIPIDEMIA (ICD-272.4) HYPOTHYROIDISM (ICD-244.9) GERD (ICD-530.81) IRRITABLE BOWEL SYNDROME (ICD-564.1) COLONIC POLYPS (ICD-211.3) 5 mm adenoma 1997 NASH (Steatohepatitis) ADENOCARCINOMA, BREAST (ICD-174.9) DEGENERATIVE JOINT DISEASE (ICD-715.90) FIBROMYALGIA (ICD-729.1) OSTEOPENIA (ICD-733.90) ANXIETY (ICD-300.00) Hx of SHINGLES (ICD-053.9) Hx of ANEMIA (ICD-285.9) Hemorrhoids  Past Surgical History: S/P hysterectomy & BSO 1978 S/P left rotator cuff surg 2003 S/P lap cholecystectomy 6/04 by Raynelle Bring S/P left breast lumpectomy & node dissection 7/05 by DrPYoung Appendectomy  Family History: Reviewed history from 11/27/2009 and no changes required. Father & brother had prostate cancer Mother had a stroke 1 Bro w/ HBP, 1 Bro w/ thor aneurysm... 1 Sis w/ emphysema, 1 Sis w/ Crohn's dis... Family History of Liver Disease/Cirrhosis: brother No FH of Colon Cancer:  Social History: Reviewed history from 11/27/2009 and no changes required. Married Children Ex-smoker Social alcohol retired Illicit Drug Use - no  Review of Systems      See HPI  The patient denies anorexia, fever, weight loss, weight gain, vision loss, decreased hearing, hoarseness, chest pain, syncope, dyspnea on exertion, peripheral edema, prolonged cough, headaches, hemoptysis, abdominal pain, melena, hematochezia, severe indigestion/heartburn, hematuria, incontinence, muscle weakness, suspicious skin lesions, transient blindness, difficulty walking, depression, unusual weight change, abnormal bleeding, enlarged lymph nodes, and angioedema.    Vital Signs:  Patient profile:   70 year old female Height:      65.75 inches Weight:      177.38 pounds BMI:     28.95 O2 Sat:      97 % on Room air Temp:     97.1 degrees F  oral Pulse rate:   94 / minute BP sitting:   134 / 84  (right arm) Cuff size:   regular  Vitals Entered By: Randell Loop CMA (January 10, 2010 9:28 AM)  O2 Sat at Rest %:  97 O2 Flow:  Room air CC: Yearly ROV & review of mult medical problems... Is Patient Diabetic? No Pain Assessment Patient in pain? no      Comments no changes in meds today   Physical Exam  Additional Exam:  WD, WN, 70 y/o WF in NAD... GENERAL:  Alert & oriented; pleasant & cooperative. HEENT:  [place]/AT, EOM-wnl, PERRLA, EACs-clear, TMs-wnl, NOSE-clear, THROAT-clear & wnl. NECK:  Supple w/ fairROM; no JVD; normal carotid impulses w/o bruits; no thyromegaly or nodules palpated; no lymphadenopathy. CHEST:  Clear to P & A; without wheezes/ rales/ or rhonchi. HEART:  Regular Rhythm; without murmurs/ rubs/ or gallops. ABDOMEN:  Soft & nontender; normal bowel sounds; no organomegaly or masses detected. EXT: without deformities , mild arthritic changes; no varicose veins/ venous insuffic/ or edema. NEURO:  CN's intact; motor testing normal; sensory testing normal; gait normal & balance OK. DERM:  No lesions noted; no rash etc...    MISC. Report  Procedure date:  01/10/2010  Findings:      DATA REVIEWED:    ~  DrMagrinat's last note 7/10...  ~  DrGessner's note 11/27/09 and Colonoscopy 12/06/09...  ~  Labs from 3/11 drGessner, and remainder of FASTING blood work done here 01/10/10...      CXR  Procedure date:  01/10/2010  Findings:      CHEST - 2 VIEW   Comparison: 12/08/2008   Findings: Trachea is midline.  Heart size normal.  Mild biapical pleural parenchymal scarring.  Lungs are clear.  No pleural fluid.   IMPRESSION: No acute findings.   Read By:  Reyes Ivan.,  M.D.   Impression & Recommendations:  Problem # 1:  HYPERTENSION (ICD-401.9) BP controlled-  same med. Her updated medication list for this problem includes:    Amlodipine Besylate 5 Mg Tabs (Amlodipine besylate) .Marland Kitchen... Take 1  tablet by mouth once a day  Orders: TLB-Lipid Panel (80061-LIPID) TLB-CBC Platelet - w/Differential (85025-CBCD) TLB-TSH (Thyroid Stimulating Hormone) (84443-TSH) T-2 View CXR (71020TC)  Problem # 2:  HYPERLIPIDEMIA (ICD-272.4) She is content w/ this level of lipid control from her diet alone-  refuses med Rx, and refuses Lipid Clinic referral...   Problem # 3:  HYPOTHYROIDISM (ICD-244.9) Stable-  same med. Her updated medication list for this problem includes:    Levothroid 75 Mcg Tabs (Levothyroxine sodium) .Marland Kitchen... Take 1 tablet by mouth once a day  Problem # 4:  GERD (ICD-530.81) Pt is encouraged to take the PPI Rx regularly... Her updated medication list for this problem includes:    Prilosec 20 Mg Cpdr (Omeprazole) .Marland Kitchen... Take 1 tablet by mouth once a day  Problem # 5:  FATTY LIVER DISEASE (ICD-571.8) Followed by Rodena Medin who favors Tamoxifen as culprit... due to stop 5 yr Tamoxifen Rx soon & will f/u LFT after that... in the meanwhile- get weight down!!!  Problem # 6:  ADENOCARCINOMA, BREAST (ICD-174.9) Follwed by DrMagrinat... Orders: T-2 View CXR (71020TC)  Problem # 7:  OSTEOPENIA (ICD-733.90) As noted- followed by DrMagrinat & Henly...  Problem # 8:  ANXIETY (ICD-300.00) Refill Alprazolam per request... Her updated medication list for this problem includes:    Alprazolam 0.5 Mg Tabs (Alprazolam) .Marland Kitchen... Take 1 tab by mouth up to three times a day as needed for nerves...  Complete Medication List: 1)  Adult Aspirin Ec Low Strength 81 Mg Tbec (Aspirin) .... Take 1 tablet by mouth once a day 2)  Amlodipine Besylate 5 Mg Tabs (Amlodipine besylate) .... Take 1 tablet by mouth once a day 3)  Fish Oil 1000 Mg Caps (Omega-3 fatty acids) .... Take two by mouth once daily 4)  Prilosec 20 Mg Cpdr (Omeprazole) .... Take 1 tablet by mouth once a day 5)  Levothroid 75 Mcg Tabs (Levothyroxine sodium) .... Take 1 tablet by mouth once a day 6)  Tamoxifen Citrate 20 Mg Tabs (Tamoxifen  citrate) .... Take 1 tablet by mouth once a day 7)  Alprazolam 0.5 Mg Tabs (Alprazolam) .... Take 1 tab by mouth up to three times a day as needed for nerves... 8)  Vitamin B-12 2500 Mcg Subl (Cyanocobalamin) .... Take one by mouth once daily 9)  One-a-day Womens Prenatal 28-0.8 & 440 Mg Misc (Prenatal vit-fe fum-fa-omega) .... Take one by mouth once daily 10)  Vitamin D3 5000 Unit Capsule (cholecalciferol)  .... Take one by mouth once daily  Other Orders: Prescription Created Electronically [id number])  Patient Instructions: 1)  Today we updated your med list- see below.... 2)  We refilled your meds for 2011... 3)  Today we did your other follow up CXR, &  blood tests to complete the full panel-  please call the "phone tree" in a few days for your lab results.Marland KitchenMarland Kitchen 4)  Continue diet efforts + exercise program... 5)  Call for any problems.Marland KitchenMarland Kitchen 6)  Please schedule a follow-up appointment in 1 year, sooner as needed. Prescriptions: ALPRAZOLAM 0.5 MG  TABS (ALPRAZOLAM) take 1 tab by mouth up to three times a day as needed for nerves...  #100 x 5   Entered and Authorized by:   Michele Mcalpine MD   Signed by:   Michele Mcalpine MD on 01/10/2010   Method used:   Print then Give to Patient   RxID:   [id number] LEVOTHROID 75 MCG  TABS (LEVOTHYROXINE SODIUM) Take 1 tablet by mouth once a day  #90 x prn   Entered and Authorized by:   Michele Mcalpine MD   Signed by:   Michele Mcalpine MD on 01/10/2010   Method used:   Print then Give to Patient   RxID:   [id number] PRILOSEC 20 MG  CPDR (OMEPRAZOLE) Take 1 tablet by mouth once a day  #90 x prn   Entered and Authorized by:   Michele Mcalpine MD   Signed by:   Michele Mcalpine MD on 01/10/2010   Method used:   Print then Give to Patient   RxID:   [id number] AMLODIPINE BESYLATE 5 MG  TABS (AMLODIPINE BESYLATE) Take 1 tablet by mouth once a day  #90 x prn   Entered and Authorized by:   Michele Mcalpine MD   Signed by:   Michele Mcalpine MD on 01/10/2010    Method used:   Print then Give to Patient   RxID:   [id number]

## 2010-10-29 NOTE — Procedures (Signed)
Summary: [facility]  [facility]   Imported By: Sherian Rein 11/30/2009 09:43:33  _____________________________________________________________________  External Attachment:    Type:   Image     Comment:   External Document

## 2010-10-29 NOTE — Assessment & Plan Note (Signed)
Summary: NASH, hx colon polyps    History of Present Illness Visit Type: Follow-up Visit Primary GI MD: Stan Head MD [facility] Primary Provider: Alroy Dust, MD Chief Complaint: fatty liver disease,  history of colon polyps History of Present Illness:   Patient here to discuss having a colonoscopy and her increased LFTS due to her Tamoxifen.  Still aches alot and cannot tolerate statsins. Myalgias are attributed to fibromyagia att his time and is not on a statin. She is due to finish with Tamoxifen in October of this year and knows her transaminases are elevated still.     GI Review of Systems      Denies abdominal pain, acid reflux, belching, bloating, chest pain, dysphagia with liquids, dysphagia with solids, heartburn, loss of appetite, nausea, vomiting, vomiting blood, weight loss, and  weight gain.        Denies anal fissure, black tarry stools, change in bowel habit, constipation, diarrhea, diverticulosis, fecal incontinence, heme positive stool, hemorrhoids, irritable bowel syndrome, jaundice, light color stool, liver problems, rectal bleeding, and  rectal pain. Preventive Screening-Counseling & Management      Drug Use:  no.      Current Medications (verified): 1)  Adult Aspirin Ec Low Strength 81 Mg  Tbec (Aspirin) .... Take 1 Tablet By Mouth Once A Day 2)  Amlodipine Besylate 5 Mg  Tabs (Amlodipine Besylate) .... Take 1 Tablet By Mouth Once A Day 3)  Fish Oil 1000 Mg Caps (Omega-3 Fatty Acids) .... Take Two By Mouth Once Daily 4)  Prilosec 20 Mg  Cpdr (Omeprazole) .... Take 1 Tablet By Mouth Once A Day 5)  Levothroid 75 Mcg  Tabs (Levothyroxine Sodium) .... Take 1 Tablet By Mouth Once A Day 6)  Tamoxifen Citrate 20 Mg  Tabs (Tamoxifen Citrate) .... Take 1 Tablet By Mouth Once A Day 7)  Alprazolam 0.5 Mg  Tabs (Alprazolam) .... Take One By Mouth Once Daily 8)  Vitamin B-12 2500 Mcg Subl (Cyanocobalamin) .... Take One By Mouth Once Daily 9)  One-A-Day Womens Prenatal 28-0.8  & 440 Mg Misc (Prenatal Vit-Fe Fum-Fa-Omega) .... Take One By Mouth Once Daily 10)  Vitamin D3 5000 Unit Capsule (Cholecalciferol) .... Take One By Mouth Once Daily  Allergies (verified): 1)  ! Codeine  Past History:  Past Medical History: HYPERTENSION (ICD-401.9) HYPERLIPIDEMIA (ICD-272.4) HYPOTHYROIDISM (ICD-244.9) GERD (ICD-530.81) IRRITABLE BOWEL SYNDROME (ICD-564.1) COLONIC POLYPS (ICD-211.3) 5 mm adenoma 1997 NASH (Steatohepatitis) ADENOCARCINOMA, BREAST (ICD-174.9) DEGENERATIVE JOINT DISEASE (ICD-715.90) FIBROMYALGIA (ICD-729.1) OSTEOPENIA (ICD-733.90) ANXIETY (ICD-300.00) Hx of SHINGLES (ICD-053.9) Hx of ANEMIA (ICD-285.9) Hemorrhoids  Past Surgical History: S/P hysterectomy & BSO 1978 S/P left rotator cuff surg 2003 S/P lap cholecystectomy 6/04 by Raynelle Bring S/P left breast lumpectomy & node dissection 7/05 by DrPYoung Appendectomy  Family History: Father & brother had prostate cancer Mother had a stroke 1 Bro w/ HBP, 1 Bro w/ thor aneurysm... 1 Sis w/ emphysema, 1 Sis w/ Crohn's dis... Family History of Liver Disease/Cirrhosis: brother No FH of Colon Cancer:  Social History: Married Children Ex-smoker Social alcohol retired Illicit Drug Use - no Drug Use:  no  Vital Signs:  Patient profile:   70 year old female Height:      65.75 inches Weight:      176.8 pounds BMI:     28.86 Pulse rate:   92 / minute Pulse rhythm:   regular BP sitting:   164 / 70  (right arm) Cuff size:   regular  Vitals Entered By: Harlow Mares CMA Duncan Dull) (November 27, 2009 9:43 AM)  Physical Exam  General:  obese.  NAD Eyes:  anicteric Lungs:  Clear throughout to auscultation. Heart:  Regular rate and rhythm; no murmurs, rubs,  or bruits. Abdomen:  obese soft and nontender without SM or mass Extremities:  no edema Skin:  no stigmata of chronic liver disease  no palmar erythema, no true spider angiomata (suspect sun changes on upper trunk) no proinent abd wall  vv Psych:  Alert and cooperative. Normal mood and affect.   Impression & Recommendations:  Problem # 1:  FATTY LIVER DISEASE (ICD-571.8) Assessment Unchanged liver biopsy 2008: moderate to marked hepatocyte steatosis with occasional degenerated hepatocytes, foci with Mallory hyaline. Minimal chronic inflammation, patchy in portal areas and perivenular and pericellular fibrosis No increasediron.  Interpretation:consistent with steatohepatitis. transaminases worse in 03/2009 to continue Tamoxifen until 10/11....if longer would need a liver bx to determne if that makes sese she is at signiificant risk of cirrhosis and we discussed [facility] eval discussed but if stopping later this yr would not  do that or bx as not clear it would change clinical course PLTs normal she realizes weight loss is neededed and best treatment I did not endorse liver health supplements she asked about  Orders: TLB-CBC Platelet - w/Differential (85025-CBCD) TLB-CMP (Comprehensive Metabolic Pnl) (80053-COMP)  Problem # 2:  COLONIC POLYPS, ADENOMATOUS, HX OF (ICD-V12.72) due for surveillance/screeng discussed potentail longer interval since diminutive adenoma in 1997 and 2000 and 2006 colonoscopies without polyps but did have breast cancer, too (? if it increases risk), she is obese with ? metabolic syndrome which does increase risk of polyps at least so will undergo colonoscopy  Risks, benefits,and indications of endoscopic procedure(s) were reviewed with the patient and all questions answered.   Orders: Colonoscopy (Colon)  Problem # 3:  FIBROMYALGIA (ICD-729.1) Assessment: Unchanged check CK to see if any myopathy underling myalgias  Orders: TLB-CK Total Only(Creatine Kinase/CPK) (82550-CK)  Patient Instructions: 1)  We will see you at your procedure on 12/06/09 2)  Please pick up your medications at your pharmacy. MOVIPREP 3)  You need to lose weight to help your fatty liver.Start by limiting portions,  amounts. Avoid eating when not hungry. Limit desserts.Look for high fructose corn syrup on food labels and if in first 3 ingredients, avoid that food. Also try to eat whole grains, avoid "white foods" (e.g. white rice, white bread).   4)  Your physician has requested that you have the following labwork done today:CBC and CMET 5)  Please go to the basement to have your lab tests drawn today. 6)  Copy sent to : Drs. Alroy Dust and Dr. Marikay Alar Magrinat 7)  The medication list was reviewed and reconciled.  All changed / newly prescribed medications were explained.  A complete medication list was provided to the patient / caregiver. Prescriptions: MOVIPREP 100 GM  SOLR (PEG-KCL-NACL-NASULF-NA ASC-C) As per prep instructions.  #1 x 0   Entered by:   Francee Piccolo CMA (AAMA)   Authorized by:   Iva Boop MD, [facility]   Signed by:   Francee Piccolo CMA (AAMA) on 11/27/2009   Method used:   [facility] to        [facility]  [address] (retail)       [address]       [place], [place]  [address]       Ph: [phone] or [phone]       Fax: [phone]   RxID:   [id number]   Appended  Document: NASH, hx colon polyps atient: Airianna Markie Note: All result statuses are Final unless otherwise noted.  Tests: (1) CBC Platelet w/Diff (CBCD)   White Cell Count          9.0 K/uL                    4.5-10.5   Red Cell Count            4.48 Mil/uL                 3.87-5.11   Hemoglobin                12.9 g/dL                   16.1-09.6   Hematocrit                38.5 %                      36.0-46.0   MCV                       85.8 fl                     78.0-100.0   MCHC                      33.5 g/dL                   04.5-40.9   RDW                       13.8 %                      11.5-14.6   Platelet Count            249.0 K/uL                  150.0-400.0   Neutrophil %              64.4 %                      43.0-77.0   Lymphocyte %              23.8 %                       12.0-46.0   Monocyte %                9.3 %                       3.0-12.0   Eosinophils%              2.2 %                       0.0-5.0   Basophils %               0.3 %                       0.0-3.0   Neutrophill Absolute      5.9 K/uL                    1.4-7.7   Lymphocyte Absolute  2.1 K/uL                    0.7-4.0   Monocyte Absolute         0.8 K/uL                    0.1-1.0  Eosinophils, Absolute                             0.2 K/uL                    0.0-0.7   Basophils Absolute        0.0 K/uL                    0.0-0.1  Tests: (2) CMP (COMP)   Sodium                    139 mEq/L                   135-145   Potassium                 4.4 mEq/L                   3.5-5.1   Chloride                  107 mEq/L                   96-112   Carbon Dioxide            28 mEq/L                    19-32   Glucose              [H]  110 mg/dL                   36-64   BUN                       9 mg/dL                     4-03   Creatinine                0.7 mg/dL                   4.7-4.2   Total Bilirubin           0.7 mg/dL                   5.9-5.6   Alkaline Phosphatase      57 U/L                      39-117   AST                  [H]  144 U/L                     0-37   ALT                  [H]  116 U/L                     0-35   Total Protein  7.6 g/dL                    0.4-5.4   Albumin                   3.6 g/dL                    0.9-8.1   Calcium                   9.7 mg/dL                   1.9-14.7   GFR                       87.97 mL/min                >60  Tests: (3) Creatine Kinase (CK)   Creatine Kinase           65 U/L                      7-177  Note: An exclamation mark (!) indicates a result that was not dispersed into the flowsheet. Document Creation Date: 11/27/2009 12:04 PM  Appended Document: NASH, hx colon polyps

## 2010-10-29 NOTE — Letter (Signed)
Summary: [facility] Instructions  [facility]  [address] [place], [place] [address]   Phone: [phone]  Fax: [phone]       [name]    [birth year]    MRN: [id number]        Procedure Day Dorna Bloom: Lenor Coffin, 12/06/09     Arrival Time: 8:00 AM      Procedure Time: 9:00 AM     Location of Procedure:                    _X_  [facility] (4th Floor)                        PREPARATION FOR COLONOSCOPY WITH MOVIPREP   Starting 5 days prior to your procedure 12/01/09 do not eat nuts, seeds, popcorn, corn, beans, peas,  salads, or any raw vegetables.  Do not take any fiber supplements (e.g. Metamucil, Citrucel, and Benefiber).  THE DAY BEFORE YOUR PROCEDURE         DATE: 12/05/09     DAY: WEDNESDAY  1.  Drink clear liquids the entire day-NO SOLID FOOD  2.  Do not drink anything colored red or purple.  Avoid juices with pulp.  No orange juice.  3.  Drink at least 64 oz. (8 glasses) of fluid/clear liquids during the day to prevent dehydration and help the prep work efficiently.  CLEAR LIQUIDS INCLUDE: Water Jello Ice Popsicles Tea (sugar ok, no milk/cream) Powdered fruit flavored drinks Coffee (sugar ok, no milk/cream) Gatorade Juice: apple, white grape, white cranberry  Lemonade Clear bullion, consomm, broth Carbonated beverages (any kind) Strained chicken noodle soup Hard Candy                             4.  In the morning, mix first dose of MoviPrep solution:    Empty 1 Pouch A and 1 Pouch B into the disposable container    Add lukewarm drinking water to the top line of the container. Mix to dissolve    Refrigerate (mixed solution should be used within 24 hrs)  5.  Begin drinking the prep at 5:00 p.m. The MoviPrep container is divided by 4 marks.   Every 15 minutes drink the solution down to the next mark (approximately 8 oz) until the full liter is complete.   6.  Follow completed prep with 16 oz of clear liquid of your choice  (Nothing red or purple).  Continue to drink clear liquids until bedtime.  7.  Before going to bed, mix second dose of MoviPrep solution:    Empty 1 Pouch A and 1 Pouch B into the disposable container    Add lukewarm drinking water to the top line of the container. Mix to dissolve    Refrigerate  THE DAY OF YOUR PROCEDURE      DATE: 12/06/09    DAY: THURSDAY  Beginning at 4:00 a.m. (5 hours before procedure):         1. Every 15 minutes, drink the solution down to the next mark (approx 8 oz) until the full liter is complete.  2. Follow completed prep with 16 oz. of clear liquid of your choice.    3. You may drink clear liquids until 7:00 AM (2 HOURS BEFORE PROCEDURE).   MEDICATION INSTRUCTIONS  Unless otherwise instructed, you should take regular prescription medications with a small sip of water   as  early as possible the morning of your procedure.         OTHER INSTRUCTIONS  You will need a responsible adult at least 71 years of age to accompany you and drive you home.   This person must remain in the waiting room during your procedure.  Wear loose fitting clothing that is easily removed.  Leave jewelry and other valuables at home.  However, you may wish to bring a book to read or  an iPod/MP3 player to listen to music as you wait for your procedure to start.  Remove all body piercing jewelry and leave at home.  Total time from sign-in until discharge is approximately 2-3 hours.  You should go home directly after your procedure and rest.  You can resume normal activities the  day after your procedure.   The day of your procedure you should not:   Drive   Make legal decisions   Operate machinery   Drink alcohol   Return to work  You will receive specific instructions about eating, activities and medications before you leave.    The above instructions have been reviewed and explained to me by   [facility], CMA    I fully understand and can verbalize  these instructions _____________________________ Date _________

## 2011-01-14 ENCOUNTER — Encounter: Payer: Self-pay | Admitting: Pulmonary Disease

## 2011-01-15 ENCOUNTER — Ambulatory Visit (INDEPENDENT_AMBULATORY_CARE_PROVIDER_SITE_OTHER)
Admission: RE | Admit: 2011-01-15 | Discharge: 2011-01-15 | Disposition: A | Discharge status: Home / Self Care | Payer: Medicare Other | Source: Ambulatory Visit | Attending: Pulmonary Disease | Admitting: Pulmonary Disease

## 2011-01-15 ENCOUNTER — Other Ambulatory Visit (INDEPENDENT_AMBULATORY_CARE_PROVIDER_SITE_OTHER): Payer: Medicare Other

## 2011-01-15 ENCOUNTER — Other Ambulatory Visit (INDEPENDENT_AMBULATORY_CARE_PROVIDER_SITE_OTHER): Payer: Medicare Other | Admitting: Pulmonary Disease

## 2011-01-15 ENCOUNTER — Ambulatory Visit (INDEPENDENT_AMBULATORY_CARE_PROVIDER_SITE_OTHER): Payer: Medicare Other | Admitting: Pulmonary Disease

## 2011-01-15 ENCOUNTER — Encounter: Payer: Self-pay | Admitting: Pulmonary Disease

## 2011-01-15 DIAGNOSIS — K219 Gastro-esophageal reflux disease without esophagitis: Secondary | ICD-10-CM

## 2011-01-15 DIAGNOSIS — I10 Essential (primary) hypertension: Secondary | ICD-10-CM

## 2011-01-15 DIAGNOSIS — D649 Anemia, unspecified: Secondary | ICD-10-CM

## 2011-01-15 DIAGNOSIS — E039 Hypothyroidism, unspecified: Secondary | ICD-10-CM

## 2011-01-15 DIAGNOSIS — Z23 Encounter for immunization: Secondary | ICD-10-CM

## 2011-01-15 DIAGNOSIS — E785 Hyperlipidemia, unspecified: Secondary | ICD-10-CM

## 2011-01-15 DIAGNOSIS — IMO0001 Reserved for inherently not codable concepts without codable children: Secondary | ICD-10-CM

## 2011-01-15 DIAGNOSIS — C50919 Malignant neoplasm of unspecified site of unspecified female breast: Secondary | ICD-10-CM

## 2011-01-15 DIAGNOSIS — K7689 Other specified diseases of liver: Secondary | ICD-10-CM

## 2011-01-15 LAB — CBC WITH DIFFERENTIAL/PLATELET
Basophils Relative: 0.7 % (ref 0.0–3.0)
Eosinophils Absolute: 0.1 10*3/uL (ref 0.0–0.7)
Hemoglobin: 14.5 g/dL (ref 12.0–15.0)
MCHC: 34.5 g/dL (ref 30.0–36.0)
MCV: 85.2 fl (ref 78.0–100.0)
Monocytes Absolute: 0.6 10*3/uL (ref 0.1–1.0)
Neutro Abs: 5.4 10*3/uL (ref 1.4–7.7)
RBC: 4.96 Mil/uL (ref 3.87–5.11)
RDW: 14.7 % — ABNORMAL HIGH (ref 11.5–14.6)

## 2011-01-15 LAB — HEPATIC FUNCTION PANEL
ALT: 109 U/L — ABNORMAL HIGH (ref 0–35)
Alkaline Phosphatase: 112 U/L (ref 39–117)
Bilirubin, Direct: 0.1 mg/dL (ref 0.0–0.3)
Total Bilirubin: 1 mg/dL (ref 0.3–1.2)

## 2011-01-15 LAB — BASIC METABOLIC PANEL
Calcium: 10 mg/dL (ref 8.4–10.5)
Chloride: 105 mEq/L (ref 96–112)
Creatinine, Ser: 0.8 mg/dL (ref 0.4–1.2)
Sodium: 140 mEq/L (ref 135–145)

## 2011-01-15 LAB — LIPID PANEL
Cholesterol: 263 mg/dL — ABNORMAL HIGH (ref 0–200)
Total CHOL/HDL Ratio: 4
VLDL: 28 mg/dL (ref 0.0–40.0)

## 2011-01-15 MED ORDER — TETANUS-DIPHTH-ACELL PERTUSSIS 5-2.5-18.5 LF-MCG/0.5 IM SUSP
0.5000 mL | Freq: Once | INTRAMUSCULAR | Status: AC
  Administered 2011-01-15: 0.5 mL via INTRAMUSCULAR

## 2011-01-15 MED ORDER — OMEPRAZOLE 20 MG PO CPDR: 20.0000 mg | DELAYED_RELEASE_CAPSULE | Freq: Every day | ORAL | Status: DC

## 2011-01-15 MED ORDER — ALPRAZOLAM 0.5 MG PO TABS: 0.5000 mg | ORAL_TABLET | Freq: Three times a day (TID) | ORAL | Status: DC | PRN

## 2011-01-15 MED ORDER — AMLODIPINE BESYLATE 5 MG PO TABS: 5.0000 mg | ORAL_TABLET | Freq: Every day | ORAL | Status: DC

## 2011-01-15 MED ORDER — LEVOTHYROXINE SODIUM 75 MCG PO TABS: 75.0000 ug | ORAL_TABLET | Freq: Every day | ORAL | Status: DC

## 2011-01-15 NOTE — Patient Instructions (Signed)
Today we updated your med list in our EPIC system...    Continue your current meds the same...    You may try "black cohosh" herbal Rx for the hot flashes...  Today we did your follow up CXR & fasting blood work...    Please call the PHONE TREE in a few days for your results...    Dial [id number] & when prompted enter your patient number followed by the # symbol...    Your patient number is:  [id number]#  If your liver enzymes are still elevated off the Tamoxifen, we will ask DrGessner to recheck you... You will be due for a Bone Density test in the fall of 2012 (be sure they send a copy to my attention)... Keep up the good work w/ your diet & exercise program... We gave your the combination Tetanus vaccine today called the TDAP- it should be good for 68yrs... Call for any questions... Let's plan a follow up visit in 1 yr, sooner if needed.Marland KitchenMarland Kitchen

## 2011-01-15 NOTE — Progress Notes (Signed)
Subjective:    Patient ID: [name], female    DOB: 05-25-1941, 70 y.o.   MRN: [id number]  HPI 70 y/o WF here for a yearly follow up visit... she has numerous medical problems as listed below...   ~  2006-2008:  her LFT's were sigif elevated and we referred her to GI/ DrGessner... eval w/ liver biopsy favored Steatohepatitis and his notes are reviewed... she has Hyperlipidemia but was intol to Lipitor and Crestor in the past and has refused to try other meds, and has refused the lipid clinic- "I'm on diet alone"... her weight is stable in the 175-180# range, despite her diet + gym work outs.  ~  January 10, 2010:  Yearly ROV doing well- without new complaints or concerns... she notes BP controlled on Amlodipine;  she is content w/ level of control of Lipids on diet alone & refuses med Rx;  GI is stable w/ f/u colonoscopy 3/11 showing no polyps;  increased LFT's w/ steatohepatitis that DrGessner feels is from Tamoxifen- he has rec weight loss (pt unsuccessful), and due to finish 19yr Tamoxifen Rx later this yr (it will be interesting to see if LFT's improve off this med)... she refuses Flu vaccines.  ~  January 15, 2011:  Yearly ROV & review- she is now off Tamoxifen per DrMagrinat after 59yr Rx- f/u LFTs now reveal ==> similar elev in SGOT/ SGPT (no improvement off the Tamoxifen), she needs f/u GI eval w/ DrGessner to underscore the importance of diet/ weight loss to decr her risk of cirrhosis due to her steatohepatitis (does she need another liver bx?)...  She notes that DrMagrinat signed off 7/11- his note is reviewed...  She is c/o hot flashes, fibromyalgia, etc> she will try black cohosh, heating pad, OTC analgesics, & her Xanax... She notes "sinusitis- all my life, just chronic stuff, allergies"...     BP controlled on meds; Chol is poorly controlled on her diet + FishOil & she refuses statin Rx> rec to the Lipid Clinic for help;  Thyroid OK on the Synth75;  Other labs- all OK...    Problem List:  HYPERTENSION (ICD-401.9) - controlled on AMLODIPINE 5mg /d... ~  NuclearStressTest 1997 was normal- no ischemia, no infarct, EF= 73%... ~  4/12:  BP=140/80 and similar at home... she denies HA, fatigue, visual changes, CP, palipit, dizziness, syncope, dyspnea, edema, etc...  HYPERLIPIDEMIA (ICD-272.4) - on diet + FISH OIL daily... hx intol to Lipitor & Crestor. ~  FLP 1/08 showed TChol 216, TG 215, HDL 43, LDL 141... refused other statins or lipid clinic... ~  FLP 1/09 (wt=180#) showed TChol 230, TG 207, HDL 46, LDL 147... ~  FLP 3/10 (wt=177#) showed TChol 188, TG 181, HDL 51, LDL 101... improved on diet Rx. ~  FLP 4/11 (wt=178#) showed TChol 201, TG 193, HDL 53, LDL 121 ~  FLP 4/12 (wt=182#) showed TChol 263, TG 140, HDL 62, LDL 182... On diet alone, need LipidClinic help.  HYPOTHYROIDISM (ICD-244.9) - new Dx 1/08 w/ TSH=6.67... started on Synthroid 78mcg/d... ~  labs 1/09 showed TSH= 2.97... ~  labs 3/10 showed TSH= 3.48... continue same dose. ~  labs 4/11 showed TSH= 4.09 ~  Labs 4/12 showed TSH= 2.61  GERD (ICD-530.81) - on PRILOSEC 20mg /d... ~  last EGD 3/05 showed sm HH and tortuous esoph...  IRRITABLE BOWEL SYNDROME (ICD-564.1) COLONIC POLYPS (ICD-211.3) - first colonoscopy in 1997 w/ sm tubular adenoma removed...  ~  f/u colon done in 2000 and neg- no polyps seen... ~  colonoscopy 2/06 by DrGessner showed hems only... ~  colonoscopy 3/11 by DrGessner was neg- no lesions seen... f/u planned 51yrs.  LIVER FUNCTION TESTS, ABNORMAL (ICD-794.8) - SEE ABOVE & eval by DrGessner... her LFT's were sigif elevated and eval w/ liver biopsy (2/08) favored Steatohepatitis and his notes are reviewed... ~  prev AbdSonar's back to 2000 showed echo-dense liver c/w fatty infiltration... CT Abd 2005 confirms. ~  LFT's 1/09 showed SGOT= 120, SGPT= 109 ~  LFT's 3/10 showed SGOT= 133, SGPT= 119... referred back to GI & Tamoxifen targeted as poss culprit. ~  LFTs 3/11 showed SGOT= 144,  SGPT= 116 ~  LFTs 4/12 showed SGOT=102, SGPT= 109... Off Tamoxifen >65mo now, she will f/u w/ DrGessner/ GI.  ADENOCARCINOMA, BREAST (ICD-174.9) - she had L breast lumpectomy 7/05 & LN biopsy, +XRT, & chemotherapy...  ~  On Tamoxifen for 82yrs & DrMagrinat's notes are reviewed> he stooped the Tamoxifen in the summer of 2011...  DEGENERATIVE JOINT DISEASE (ICD-715.90)  FIBROMYALGIA (ICD-729.1)  OSTEOPENIA (ICD-733.90) - BMD's per DrMagrinat and DrHenley... VitD level was 47 in 2008...  ANXIETY (ICD-300.00) - on ALPRAZOLAM 0.5mg Tid Prn...  Hx of SHINGLES (ICD-053.9) - she states she's had shingles twice- last 5/09 involving left T10 distrib... no signif post herpetic problems... we discussed the indication for the Shingles vaccine.  Hx of ANEMIA (ICD-285.9) - previous anemia resolved & Hg has been normal x yrs now.   Past Surgical History  Procedure Date  . Abdominal hysterectomy   . Bilateral salpingoophorectomy 1978  . Rotator cuff repair     left  . Laparoscopic cholecystectomy   . Breast lumpectomy     left--node dissection 7/05 by Dr. Luan Moore  . Appendectomy     Outpatient Encounter Prescriptions as of 01/15/2011  Medication Sig Dispense Refill  . ALPRAZolam (XANAX) 0.5 MG tablet 1 tablet 3 times a day as needed for nerves       . amLODipine (NORVASC) 5 MG tablet Take 5 mg by mouth daily.        Marland Kitchen aspirin (ADULT ASPIRIN EC LOW STRENGTH) 81 MG EC tablet Take 81 mg by mouth daily.        . Cholecalciferol (VITAMIN D3) 5000 UNITS CAPS Take by mouth. Once a day       . Cyanocobalamin (VITAMIN B-12) 2500 MCG SUBL Place under the tongue. Once a day       . levothyroxine (LEVOTHROID) 75 MCG tablet Once a day       . Omega-3 Fatty Acids (FISH OIL) 1000 MG CAPS Take by mouth. Take 2 tablets once a day       . omeprazole (PRILOSEC) 20 MG capsule Take 20 mg by mouth daily.        . Prenatal Vit-Fe Fum-FA-Omega (ONE-A-DAY WOMENS PRENATAL) 28-0.8 & 440 MG MISC Take by mouth. Once a day        . tamoxifen (NOLVADEX) 20 MG tablet One tablet once a day         Allergies  Allergen Reactions  . Codeine     REACTION: nausea and vomiting    Review of Systems         See HPI - all other systems neg except as noted... The patient denies anorexia, fever, weight loss, weight gain, vision loss, decreased hearing, hoarseness, chest pain, syncope, dyspnea on exertion, peripheral edema, prolonged cough, headaches, hemoptysis, abdominal pain, melena, hematochezia, severe indigestion/heartburn, hematuria, incontinence, muscle weakness, suspicious skin lesions, transient blindness, difficulty walking, depression, unusual weight  change, abnormal bleeding, enlarged lymph nodes, and angioedema.     Objective:   Physical Exam     WD, WN, 69 y/o WF in NAD... GENERAL:  Alert & oriented; pleasant & cooperative. HEENT:  [place]/AT, EOM-wnl, PERRLA, EACs-clear, TMs-wnl, NOSE-clear, THROAT-clear & wnl. NECK:  Supple w/ fairROM; no JVD; normal carotid impulses w/o bruits; no thyromegaly or nodules palpated; no lymphadenopathy. CHEST:  Clear to P & A; without wheezes/ rales/ or rhonchi. HEART:  Regular Rhythm; without murmurs/ rubs/ or gallops. ABDOMEN:  Soft & nontender; normal bowel sounds; no organomegaly or masses detected. EXT: without deformities , mild arthritic changes; no varicose veins/ venous insuffic/ or edema. NEURO:  CN's intact; motor testing normal; sensory testing normal; gait normal & balance OK. DERM:  No lesions noted; no rash etc...   Assessment & Plan:   HBP>  Controlled, continue same Rx...  Hyperlipidemia>  On diet alone w/ poor control; she refuses statin Rx & I've once again encouraged her to go to the Lipid Clinic for eval...  Hypothy>  Stable on the Synthroid75...  LFTs>  Likely steatohepatitis/ fatty liver dis & she desperately need to lose wt & get her lipid under control; concern for evolution to cirrhosis & we will ask DrGessner to re-eval & point out the need for  wt reduction...  Breast Cancer> she has been released by DrMagrinat...  FM>  She has mod aching, soreness, and day to day functional problems;  Uses OTC analgesics prn.Marland KitchenMarland Kitchen

## 2011-01-27 ENCOUNTER — Encounter: Payer: Self-pay | Admitting: Pulmonary Disease

## 2011-02-03 ENCOUNTER — Telehealth: Payer: Self-pay | Admitting: *Deleted

## 2011-02-03 DIAGNOSIS — K7689 Other specified diseases of liver: Secondary | ICD-10-CM

## 2011-02-03 DIAGNOSIS — K219 Gastro-esophageal reflux disease without esophagitis: Secondary | ICD-10-CM

## 2011-02-03 DIAGNOSIS — E785 Hyperlipidemia, unspecified: Secondary | ICD-10-CM

## 2011-02-03 DIAGNOSIS — K589 Irritable bowel syndrome without diarrhea: Secondary | ICD-10-CM

## 2011-02-03 NOTE — Telephone Encounter (Signed)
Called and lmom for pt to return my call x 3 to discuss lab results per SN---will place order for pt to see GI and lipid clinic and will try to contact pt later.,

## 2011-02-04 NOTE — Telephone Encounter (Signed)
Called and spoke with pt and she is aware of lab results--pt is aware that SN recs she follow up with lipid clinic and GI and pt stated that she is working hard on her diet right now and she will call back at a later date to schedule these appts.

## 2011-02-06 ENCOUNTER — Ambulatory Visit: Payer: Medicare Other

## 2011-02-11 ENCOUNTER — Ambulatory Visit: Payer: Medicare Other | Admitting: Internal Medicine

## 2011-02-14 NOTE — Assessment & Plan Note (Signed)
[facility]                         GASTROENTEROLOGY OFFICE NOTE   NAME:ELLIXSONShonya, [name]                   MRN:          [id number]  DATE:10/23/2006                            DOB:          Apr 26, 1941    REFERRING PHYSICIAN:  Lonzo Cloud. Kriste Basque, MD   CHIEF COMPLAINT:  Abnormal LFTs.   ASSESSMENT:  Abnormal transaminases at AST 105, ALT 85. These are  approximately 3 and 2 times abnormal. This is probably due to fatty  liver. Tamoxifen may be aggravating this.   PLAN:  1. Recheck ultrasound, it has been years since she has had one.      Ultrasound of the abdomen and liver.  2. Check ANA, AMA, antismooth muscle antibody, hepatitis B surface      antigen, hepatitis C antibody, serum protein electrophoresis and a      ferritin as a serology workup for other causes of liver disease.  3. Will contact the patient when these return and determine a plan of      followup LFTs versus other imaging or biopsy. I suspect we can      follow this but we will see what turns up.   Note, her BMI is 28 which puts her in the overweight category. She is  177 pounds today, top normal weight for her height would be 157 pounds.  I think she would be helped by losing 10 pounds at least and she will  work on that.   HISTORY:  A 70 year old white woman that I know from previous  colonoscopy evaluation (last one negative 2006) and has a known history  of abnormal transaminases. Dr. Kriste Basque saw her October 13, 2006 where an  AST was 105, ALT 85 with normal bilirubin, normal albumin and normal  alkaline phosphatase. She has a long history of abnormal transaminases  and an ultrasound in 2004 demonstrated an echo-dense liver consistent  with fatty liver. She had ended up having a cholecystectomy which  relieved biliary colic symptoms. She is treated for breast cancer with  Tamoxifen by Dr. Darnelle Catalan. She denies any obvious symptoms. As far as  family history, she does have a  history of fatty liver in her siblings.   REVIEW OF SYSTEMS:  Constitutional, eyes, heart, respiratory negative.  She has had some sinus congestion.   MEDICATIONS:  1. Tamoxifen 200 mg daily.  2. Amlodipine 5 mg daily.  3. Levothyroxine 75 mg daily.  4. Nexium 40 mg daily.  5. Aspirin 81 mg daily.  6. Calcium citrate 630 mg b.i.d.  7. Multivitamin daily.  8. Fish oil 1000 mg daily.  9. CranSure 500 mg weekly.  10.Alprazolam 0.5 mg p.r.n.   MEDICATION ALLERGIES:  CODEINE cause nausea and vomiting.   FAMILY HISTORY:  As mentioned above.   PAST MEDICAL HISTORY:  1. Breast cancer with grade 3 infiltrating ductal carcinoma, ER      positive, PR negative, HER-2/neu 3+ treated with Taxol and      Herceptin. She was unable to tolerate Arimidex and Tamoxifen was      started in October 2006.  2. Status post left lumpectomy.  3.  External hemorrhoids on colonoscopy November 18, 2004.  4. History of iron deficiency anemia.  5. Small sliding hiatal hernia. Previous biopsy of the duodenum      negative for sprue (Dr. Kinnie Scales).  6. Prior history of adenomatous colon polyps.  7. History of irritable bowel syndrome.  8. History of fibromyalgia.  9. Prior hysterectomy and oophorectomy.  10.History of endometriosis.  11.History of dyslipidemia.  12.Rotator cuff surgery 2003.  13.Prior laparoscopic cholecystectomy.  14.History of hypertension.  15.Gastroesophageal reflux disease.   SOCIAL HISTORY:  No alcohol.   PHYSICAL EXAMINATION:  GENERAL:  Well-developed white woman in no acute  distress.  VITAL SIGNS:  Weight 177 pounds, pulse 96, blood pressure 146/74.  HEENT:  Eyes anicteric.  LUNGS:  Clear.  HEART:  S1, S2, no murmurs, rubs or gallops.  ABDOMEN:  Soft, nontender without organomegaly or mass.  SKIN:  There a couple of spider angiomata-like changes in the upper  chest that could be liver disease versus sun damage. There is no other  stigmata of chronic liver disease.   I  appreciate the opportunity to care for this patient.     Iva Boop, MD,FACG  Electronically Signed    CEG/MedQ  DD: 10/23/2006  DT: 10/23/2006  Job #: [id number]   cc:   Lonzo Cloud. Kriste Basque, MD  Valentino Hue Magrinat, M.D.

## 2011-02-14 NOTE — Op Note (Signed)
NAMEGUSTA, MARKSBERRY            ACCOUNT NO.:  [id number]   MEDICAL RECORD NO.:  [id number]          PATIENT TYPE:  AMB   LOCATION:  DSC                          FACILITY:  [facility]   PHYSICIAN:  Rose Phi. Maple Hudson, M.D.   DATE OF BIRTH:  1940/12/22   DATE OF PROCEDURE:  04/24/2005  DATE OF DISCHARGE:                                 OPERATIVE REPORT   PREOPERATIVE DIAGNOSIS:  Carcinoma of the left breast.   POSTOPERATIVE DIAGNOSIS:  Carcinoma of the left breast.   OPERATION:  Removal of Port-A-Cath.   SURGEON:  Rose Phi. Maple Hudson, M.D.   ANESTHESIA:  Local.   OPERATIVE PROCEDURE:  Patient placed on the operating table and the area of  the port anesthetized with 1% Xylocaine with adrenalin.  A transverse  incision was made and the port exposed.  The two sutures holding it in place  were divided and with traction on the port and the catheter, the whole  system was removed.  There was no bleeding.   It was then closed with a subcuticular 4-0 Monocryl and Steri-Strips.  Dressing applied.  The patient was then allowed to go home.       PRY/MEDQ  D:  04/24/2005  T:  04/24/2005  Job:  [id number]

## 2011-02-14 NOTE — H&P (Signed)
[facility]  Patient:    [name], [name] Visit Number: [id number] MRN: [id number]          Service Type: Attending:  Javier Docker, M.D. Dictated by:   Dorie Rank, P.A. Adm. Date:  12/24/01                           History and Physical  DATE OF BIRTH:  June 07, 1941  CHIEF COMPLAINT:  Left shoulder pain.  HISTORY OF PRESENT ILLNESS:  The patient is a pleasant 70 year old female who has had a greater than three-year history of left shoulder pain.  It is at a point now where it is interfering with her activities of daily living, and the pain is almost constant in nature.  She was seen in the office.  On physical exam it was noted she had exquisite tenderness over the [facility] joint.  She was tender in the anterior subacromial region with a positive impingement sign. She had decreased internal rotation and abduction, only to about 110 degrees. She had a positive crossover maneuver.  She had an MRI which demonstrated AC arthrosis, type 2 acromion, and rotator cuff arthropathy.  No evidence was seen of a complete tear.  It was felt due to her symptoms being refractory to conservative treatment and MRI results that she would benefit from undergoing a shoulder scope, distal clavicular resection, subacromial decompression, and possible rotator cuff repair on the left.  The risks and benefits as well as the procedure were discussed with the patient, who elected to proceed.  She obtained medical clearance from her family medical doctor, Dr. Kriste Basque.  ALLERGIES:  CODEINE causes nausea.  MEDICATIONS: 1. Premarin 0.625 mg, 1 p.o. q.d. 2. Norvasc 5 mg, 1 p.o. q.d. 3. Alprazolam 0.5 mg, 1 p.o. q.d. 4. Nexium 40 mg, 1 p.o. q.d.  PAST MEDICAL HISTORY: 1. Hypertension. 2. GERD, which is controlled with Nexium. 3. Osteoarthritis to bilateral hands and neck. 4. History of sinusitis and seasonal allergies. 5. History of irritable bowel syndrome which manifests itself  mostly as    diarrhea.  SOCIAL HISTORY:  The patient is married.  She is right-hand dominant.  She is a retired Production manager.  She denies any tobacco use.  She drinks socially only.  FAMILY HISTORY:  Father deceased, age 68, history of prostate cancer.  Mother deceased, age 47, history of cerebrovascular accident.  PAST SURGICAL HISTORY: 1. Hysterectomy in 1977. 2. Tonsillectomy in 1974.  REVIEW OF SYSTEMS:  GENERAL:  No fevers, chills, night sweats, or bleeding tendencies.  PULMONARY:  No shortness of breath, productive cough, or hemoptysis.  CARDIOVASCULAR:  No chest pain, angina, or orthopnea.  ENDOCRINE: No history of hypo- or hyperthyroidism.  No history of diabetes mellitus. NEUROLOGIC:  No seizures, headaches, or paralysis.  GASTROINTESTINAL:  No nausea, vomiting.  Occasional diarrhea.  No constipation.  GENITOURINARY:  No hematuria, dysuria, or discharge.  MUSCULOSKELETAL:  Positive for bilateral osteoarthritis of the hands.  PHYSICAL EXAMINATION:  GENERAL:  Alert and oriented, well-developed, well-nourished.  In no acute distress.  VITAL SIGNS:  Pulse 78, respirations 12, blood pressure 130/90.  HEENT:  Head atraumatic, normocephalic.  Oropharynx is clear.  NECK:  Supple.  Negative for carotid bruits bilaterally.  LUNGS:  Clear to auscultation bilaterally.  No wheezes, rhonchi, or rales.  ADENOPATHY:  Negative for cervical lymphadenopathy palpated on exam.  BREASTS:  Not pertinent to present illness.  HEART:  S1, S2.  Negative for murmur,  rub, or gallop.  Regular rate and rhythm.  ABDOMEN:  Soft and nontender.  Positive bowel sounds.  GENITOURINARY:  Not pertinent to present illness.  EXTREMITIES:  Tender over AC joint.  Please see history of present illness for physical exam of the left shoulder.  Skin is intact.  No rashes or lesions appreciated on exam.  LABORATORY DATA:  Preoperative laboratories and x-rays are pending.  IMPRESSION: 1. Acromioclavicular  arthrosis, impingement syndrome with adhesive capsulitis. 2. Hypertension. 3. Gastroesophageal reflux disease. 4. Seasonal allergies. 5. Irritable bowel syndrome.  PLAN:  EUA, MUA, distal clavicle resection, and subacromial decompression, with possible rotator cuff repair on the left. Dictated by:   Dorie Rank, P.A. Attending:  Javier Docker, M.D. DD:  12/14/01 TD:  12/15/01 Job: [id number]

## 2011-02-14 NOTE — Op Note (Signed)
[name], [name]                      ACCOUNT NO.:  [id number]   MEDICAL RECORD NO.:  [id number]                   PATIENT TYPE:  AMB   LOCATION:  DSC                                  FACILITY:  [facility]   PHYSICIAN:  Rose Phi. Maple Hudson, M.D.                DATE OF BIRTH:  1940/10/06   DATE OF PROCEDURE:  05/13/2004  DATE OF DISCHARGE:                                 OPERATIVE REPORT   PREOPERATIVE DIAGNOSIS:  Stage one carcinoma of the left breast.   POSTOPERATIVE DIAGNOSIS:  Stage one carcinoma of the left breast.   OPERATION:  Insertion of Port-a-Cath.   SURGEON:  Rose Phi. Maple Hudson, M.D.   ANESTHESIA:  MAC.   OPERATIVE PROCEDURE:  The patient was placed on the operating room table and  with a roll between the shoulders, and right upper chest and neck prepped  and draped in the usual fashion.   Under local anesthesia, a right subclavian puncture was carried out without  difficulty.  A guide wire was inserted and proper positioning confirmed by  fluoroscopy.  We then made a pocket in the anterior chest wall at upper end  of the breast, and then tunneled between the subclavian puncture site and  its newly developed pocket.  Passed the catheter, which we then attached to  the port and anchored it in place with two 2-0 Prolene sutures.  The  catheter tip was then trimmed to go to the fourth interspace, which was  between the 40 and 42 cm mark on the catheter.  The dilator and  peel-away sheath were then passed over the wire, and then we removed the  wire followed by the dilator and then passed the catheter through the peel-  away sheath and then removed it.  Fluoroscopy again confirmed there was no  kinking in the system and the catheter tip was in the superior vena cava.   The incisions were closed with 3-0 Vicryl and subcuticular 4-0 Monocryl and  Steri-Strips.  We then accessed it with a right-angled Huber point needle  and fully heparinized it and left it attached for her  chemotherapy tomorrow.  Dressings were then applied and the patient transferred to the recovery room  in satisfactory condition, having tolerated the procedure well.                                               Rose Phi. Maple Hudson, M.D.    PRY/MEDQ  D:  05/13/2004  T:  05/13/2004  Job:  [id number]

## 2011-02-14 NOTE — Op Note (Signed)
[name], [name]                      ACCOUNT NO.:  [id number]   MEDICAL RECORD NO.:  [id number]                   PATIENT TYPE:  OUT   LOCATION:  MRI                                  FACILITY:  [facility]   PHYSICIAN:  Rose Phi. Maple Hudson, M.D.                DATE OF BIRTH:  05/01/1941   DATE OF PROCEDURE:  04/03/2004  DATE OF DISCHARGE:                                 OPERATIVE REPORT   PREOPERATIVE DIAGNOSIS:  Stage I carcinoma of the left breast.   POSTOPERATIVE DIAGNOSIS:  Stage I carcinoma of the left breast.   OPERATION:  1. Blue dye injection.  2. Left partial mastectomy with needle localization and specimen mammogram.  3. Left sentinel lymph node biopsy.   SURGEON:  Rose Phi. Maple Hudson, M.D.   ANESTHESIA:  General.   OPERATIVE PROCEDURE:  Prior to coming to the operating room, 1 millicurie of  Technetium and sulfur colloid was injected intradermally and a localizing  wire had been placed in the breast.  After suitable general anesthesia was  induced, the patient was placed in a supine position with the arms extended  on the arm board.  Then 5 cc of a mixture of 2 cc of methylene blue and 3 cc  of saline were then injected in the subareolar tissue, and the breast gently  massaged.   After prepping and draping, a curved incision centered at about the 4  o'clock position of the left breast was made.  We delivered the wire into  the incision and did a wide excision of the wire and surrounding tissue.  Specimen mammography confirmed the removal of the lesion.  We had oriented  it for the pathologist, and touch preps on the margins were negative.   While that was being done, a short transverse left axillary incision was  made with dissection through the subcutaneous tissue to the clavipectoral  fascia.  Deep to the fascia was a hot lymph node, which we removed.  It had  counts in excess of 300.  There were no other palpable, hot, and there were  no blue lymph nodes.  We had good  hemostasis.  The incision was closed with  a 3-0 Vicryl and subcuticular 4-0 Monocryl after injecting it with 0.25%  Marcaine.  We also injected Marcaine in the lumpectomy site and closed that  incision in a similar fashion.  The sentinel node was also negative for  metastatic disease.   Dressings were then applied, and the patient was transferred to the recovery  room in satisfactory condition, having tolerated the procedure well.                                               Rose Phi. Maple Hudson, M.D.    PRY/MEDQ  D:  04/05/2004  T:  04/06/2004  Job:  [id number]

## 2011-02-14 NOTE — Op Note (Signed)
NAMEJODY, [name]                      ACCOUNT NO.:  [id number]   MEDICAL RECORD NO.:  [id number]                   PATIENT TYPE:  OBV   LOCATION:  0440                                 FACILITY:  [facility]   PHYSICIAN:  Angelia Mould. Derrell Lolling, M.D.             DATE OF BIRTH:  May 28, 1941   DATE OF PROCEDURE:  03/10/2003  DATE OF DISCHARGE:                                 OPERATIVE REPORT   PREOPERATIVE DIAGNOSIS:  Biliary dyskinesia.   POSTOPERATIVE DIAGNOSIS:  Biliary dyskinesia.   OPERATION:  Laparoscopic cholecystectomy with intraoperative cholangiogram.   SURGEON:  Angelia Mould. Derrell Lolling, M.D.   FIRST ASSISTANT:  Anselm Pancoast. Zachery Dakins, M.D.   INDICATIONS FOR PROCEDURE:  This is a 70 year old white female who has a  four year history of frequent episodes of nausea and bloating and right  upper quadrant pain. This is now occurring on an almost daily basis. She  also has chronic diarrhea and has been told she has irritable bowel  syndrome. She has had gallbladder ultrasounds in the past which have been  normal. Recent HIDA scan shows significantly reduced ejection fraction of  5.7% at 60 minutes with recurrence of nausea and pain during the CCK  injection. Liver function tests are mildly elevated. It is felt that she  most likely has biliary dyskinesia. She was offered cholecystectomy and is  brought to the operating room electively.   FINDINGS:  The gallbladder was thick walled and discolored with chronic  adhesions to it. The liver appeared normal. The anatomy of the cystic duct,  cystic artery and common bile duct were conventional. The cholangiogram  showed small intrahepatic and extrahepatic bile ducts, but the bile duct  anatomy was normal. There was no filling defect, there was prompt flow of  contrast into the duodenum. Small intestine, large intestine, stomach and  duodenum looked grossly normal.   TECHNIQUE:  Following the induction of general endotracheal anesthesia,  the  patient's abdomen was prepped and draped in a sterile fashion. 0.5% Marcaine  with epinephrine was used as a local infiltration anesthetic. A vertically  oriented incision was made at the lower rim of the umbilicus, through a  previous scar. The fascia was incised in the midline and the abdominal  cavity entered under direct vision. A 10 mm Hasson trocar was inserted and  secured with a pursestring suture of #0 Vicryl. Pneumoperitoneum was  created. The video camera was inserted with visualization and findings as  described above. A 10 mm trocar was placed in the subxiphoid region and two  5 mm trocars placed in the right upper quadrant. The gallbladder fundus was  elevated. We carefully took down the chronic adhesions which were fairly  significant. We dissected out the cystic duct and the cystic artery. We  isolated the cystic artery close to the gallbladder and secured the anterior  branch of the cystic artery with metal clips and divided it. We then  isolated  the posterior branch of the cystic artery, secured it with metal  clips and divided it. We then had a large window behind the cystic duct. We  opened the cystic duct with scissors and we had clear bile coming out of  that. We inserted the cholangiogram catheter. Cholangiogram was obtained  using the C-arm. The cholangiogram showed normal intrahepatic and  extrahepatic bile duct anatomy but small caliber bile ducts. There was no  filling defect. There was no obstruction. There was good evacuation of  contrast into the duodenum. The cholangiogram catheter was removed. The  cystic duct was secured with multiple metal clips and divided. The  gallbladder was dissected from its bed with electrocautery and removed  through the umbilical port. The operative field was irrigated with saline.  There was no bleeding whatsoever and no bile leak whatsoever and no bile  leak whatsoever at the completion of the case. The irrigation fluid  was  clear. Trocars were removed under direct vision and there was no bleeding  from the trocar sites. The pneumoperitoneum was released. The fascia at the  umbilicus was closed with #0 Vicryl suture. The skin incisions were closed  with subcuticular sutures of 4-0 Vicryl and Steri-Strips. Clean bandages  were placed and the patient taken to the recovery room in stable condition.  Estimated blood loss was about 10 mL. Complications none. Sponge, needle and  instrument counts were correct.                                               Angelia Mould. Derrell Lolling, M.D.    HMI/MEDQ  D:  03/10/2003  T:  03/10/2003  Job:  [id number]   cc:   Lonzo Cloud. Kriste Basque, M.D. [facility]

## 2011-02-14 NOTE — Op Note (Signed)
[facility]  Patient:    [name], [name] Visit Number: [id number] MRN: [id number]          Service Type: Attending:  Javier Docker, M.D. Dictated by:   Javier Docker, M.D. Proc. Date: 12/24/01                             Operative Report  PREOPERATIVE DIAGNOSES: 1. Acromioclavicular arthrosis. 2. Impingement syndrome. 3. Possible rotator cuff tear.  POSTOPERATIVE DIAGNOSES: 1. Acromioclavicular arthrosis. 2. Impingement syndrome. 3. No rotator cuff tear.  PROCEDURE PERFORMED: 1. Left clavicle resection acromioplasty. 2. Bursectomy.  ANESTHESIA:  General.  SURGEON:  Javier Docker, M.D.  ASSISTANTJill Side P. Mahar, P.A.-C.  BRIEF HISTORY AND INDICATION:  A 70 year old with refractory shoulder pain, MRI indicating no definitive rotator cuff tear but AC arthrosis and impingement and despite conservative treatment including corticosteroid injection, operative intervention was indicated for decompression by distal clavicle resection and acromioplasty, evaluation of the rotator cuff, and repair if necessary.  Risks and benefits discussed including bleeding, infection, damage to vascular structures, no changes in her symptoms, fibrosis, etc., also had evidence of range of motion.  Discussed intraoperative lysis of adhesions.  TECHNIQUE:  Patient in supine beach chair position.  After an adequate level of general anesthesia, 1 g Kefzol, the left shoulder and upper extremity was prepped and draped in the usual sterile fashion.  Surgical marker was utilized to delineate the acromion and AC joint.  Incision was made over the anterior aspect of the acromion towards the medial half through the skin and Langers lines.  The subcutaneous tissue was dissected.  Electrocautery was utilized to achieve hemostasis.  Marcaine 25% with epinephrine was infiltrated in the subcutaneous tissue.  Raphe between the anterolateral head of the deltoid  was divided.  Subperiosteum elevated from the anterior aspect of the acromion medially to laterally as well as to skeletonize and expose the distal clavicle.  There was a large spur off the acromion and the clavicle, projecting inferiorly, impinging the rotator cuff.  Bennetts were utilized to protect the distal clavicle from the underlying rotator cuff, and an oscillating saw utilized to perform a distal cut from the distal assembly of the clavicle.  Residual spur was removed with 3 mm Kerrisons anteromedially. This decompressed the rotator cuff at the [place] joint.  Acromion, its more anterior portion was removed 2 mm, oscillating saw utilized to perform the acromioplasty converting it to a type 1, and it was further contoured with a high-speed bur.  Next the wound was copiously irrigated.  Hypertrophic bursa was noted, and this was excised.  Digital manipulation was performed to reveal lysis of adhesions and range of motion that was full.  Full inspection of the rotator cuff and palpitation showed no evidence of an obvious rotator cuff tear.  There were areas of erythema anterolaterally under the region of the acromion.  This was sloped anterolaterally.  Anterolateral edge was removed with a rongeur as well.  The wound copiously irrigated again.  Raphe was repaired with #1 Vicryl interrupted figure-of-eight sutures as was the subperiosteal attachment of the deltoid to the acromion toward the [facility] joint. The [facility] joint was not unstable.  Next, the trapeziodeltoid fascia was repaired as well a subcutaneous tissue reapproximated with 2-0 Vicryl simple sutures. The skin was reapproximated with 4-0 subcuticular Prolene and reinforced with Steri-Strips.  The wound was dressed sterilely.  The patient was placed in a  sling, extubated without difficulty and transported to the recovery room in satisfactory condition.  The patient tolerated the procedure well without complications. Dictated by:   Javier Docker, M.D. Attending:  Javier Docker, M.D. DD:  12/24/01 TD:  12/25/01 Job: [id number]

## 2011-02-14 NOTE — Assessment & Plan Note (Signed)
[facility]                         GASTROENTEROLOGY OFFICE NOTE   NAME:[name], [name]                   MRN:          [id number]  DATE:12/01/2006                            DOB:          1941-08-01    Mrs. Konkel presents to discuss her liver biopsy results.   On the basis of abnormal transaminases greater than three times normal  and a positive ANA, I had her undergo a liver biopsy.  The liver biopsy  demonstrated moderate to marked hepatocyte steatosis with occasional  degenerated hepatocytes and foci with Mallory hyaline.  There was  minimal chronic inflammation that was patchy in the portal areas and  there was perivenular and pericellular fibrosis.  There was no increased  iron.  Interpretation was that this was consistent with steatohepatitis.   I have reviewed this with her today.  Her synthetic function is fine.  It is our thought that this is most likely due to her tamoxifen  plus/minus perhaps some underlying metabolic syndrome.  Her lipids have  been up a little bit lately but her TSH was slightly up too, so maybe  that was part of it.  She had had her Synthroid dose increased recently.  There is no obvious stigmata of cirrhosis or significant liver  dysfunction.   Weight 178 pounds, height 5 feet 6 inches, pulse 84, blood pressure  138/72.  Body mass index is 28.7 which puts her in the overweight  category.   ASSESSMENT:  Steatohepatitis, most likely due to tamoxifen.  Over 15  minutes of time spent discussing this with the patient and her husband  today.  There is some fibrosis.  I told her that this could progress  toward cirrhosis.  She has about 2-and-a-half years to go on tamoxifen.  She obviously needs that to prevent breast cancer recurrence.  She could  lose 10 pounds that I think would help; her ideal weight would be around  154 so a 24-pound weight loss would put her in the ideal weight range.  There is no good  therapeutic option at this point as far as medication.  This is explained to her.   I did offer her an appointment at [facility] at their liver center where they  have expertise in steatohepatitis but she wishes to hold off on that and  I think that is fine.   Our current plan is to monitor her LFTs.  I am going to recheck them and  her TSH today.  If there is any significant change in the LFTs going up,  that may prompt the referral or another liver biopsy.  I would wait at  least a year before another liver biopsy to monitor things, and perhaps  longer.  We will keep close attention to any changes in therapy for  steatohepatitis, but at this point I am assuming this is mainly a drug-  related problem.  Hopefully when she comes off her tamoxifen things will  quiet down and there will be no significant problems in the future,  though she knows it is difficult to know what will happen one way or the  other.  Given her age  and the likelihood of improvement when she comes off of tamoxifen, there  is an overall favorable prognosis in my opinion, those other things not  withstanding.     Iva Boop, MD,FACG  Electronically Signed    CEG/MedQ  DD: 12/01/2006  DT: 12/01/2006  Job #: [id number]   cc:   Valentino Hue. Magrinat, M.D.  Lonzo Cloud. Kriste Basque, MD  Rose Phi. Maple Hudson, M.D.

## 2011-02-20 ENCOUNTER — Other Ambulatory Visit: Payer: Self-pay | Admitting: Pulmonary Disease

## 2011-02-20 DIAGNOSIS — R922 Inconclusive mammogram: Secondary | ICD-10-CM

## 2011-03-31 ENCOUNTER — Ambulatory Visit
Admission: RE | Admit: 2011-03-31 | Discharge: 2011-03-31 | Disposition: A | Discharge status: Home / Self Care | Payer: Medicare Other | Source: Ambulatory Visit | Attending: Pulmonary Disease | Admitting: Pulmonary Disease

## 2011-03-31 DIAGNOSIS — Z9889 Other specified postprocedural states: Secondary | ICD-10-CM

## 2011-03-31 DIAGNOSIS — R922 Inconclusive mammogram: Secondary | ICD-10-CM

## 2011-10-27 ENCOUNTER — Telehealth: Payer: Self-pay | Admitting: Pulmonary Disease

## 2011-10-27 MED ORDER — ALPRAZOLAM 0.5 MG PO TABS: 0.5000 mg | ORAL_TABLET | Freq: Three times a day (TID) | ORAL | Status: DC | PRN

## 2011-10-27 NOTE — Telephone Encounter (Signed)
Called and spoke with pt and appt has been made for her to see SN in April 2013.  She stated that her last rx for the alprazolam ran out without her being able to use the refills.  Pt stated that she does not take this med very much.  She is out of refills and needs this med called to the pharmacy for refill.  Pt is aware that this has been completed and called to her pharmacy.  Pt voiced her understanding of this.

## 2011-10-30 ENCOUNTER — Telehealth: Payer: Self-pay | Admitting: Allergy

## 2012-01-19 ENCOUNTER — Other Ambulatory Visit (INDEPENDENT_AMBULATORY_CARE_PROVIDER_SITE_OTHER): Payer: Medicare Other

## 2012-01-19 ENCOUNTER — Encounter: Payer: Self-pay | Admitting: Pulmonary Disease

## 2012-01-19 ENCOUNTER — Ambulatory Visit (INDEPENDENT_AMBULATORY_CARE_PROVIDER_SITE_OTHER)
Admission: RE | Admit: 2012-01-19 | Discharge: 2012-01-19 | Disposition: A | Discharge status: Home / Self Care | Payer: Medicare Other | Source: Ambulatory Visit

## 2012-01-19 ENCOUNTER — Ambulatory Visit (INDEPENDENT_AMBULATORY_CARE_PROVIDER_SITE_OTHER)
Admission: RE | Admit: 2012-01-19 | Discharge: 2012-01-19 | Disposition: A | Discharge status: Home / Self Care | Payer: Medicare Other | Source: Ambulatory Visit | Attending: Pulmonary Disease | Admitting: Pulmonary Disease

## 2012-01-19 ENCOUNTER — Ambulatory Visit (INDEPENDENT_AMBULATORY_CARE_PROVIDER_SITE_OTHER): Payer: Medicare Other | Admitting: Pulmonary Disease

## 2012-01-19 VITALS — BP 124/76 | HR 76 | Temp 97.6°F | Ht 65.75 in | Wt 181.8 lb

## 2012-01-19 DIAGNOSIS — K589 Irritable bowel syndrome without diarrhea: Secondary | ICD-10-CM

## 2012-01-19 DIAGNOSIS — I10 Essential (primary) hypertension: Secondary | ICD-10-CM

## 2012-01-19 DIAGNOSIS — C50919 Malignant neoplasm of unspecified site of unspecified female breast: Secondary | ICD-10-CM

## 2012-01-19 DIAGNOSIS — M949 Disorder of cartilage, unspecified: Secondary | ICD-10-CM

## 2012-01-19 DIAGNOSIS — M899 Disorder of bone, unspecified: Secondary | ICD-10-CM

## 2012-01-19 DIAGNOSIS — M1990 Unspecified osteoarthritis, unspecified site: Secondary | ICD-10-CM

## 2012-01-19 DIAGNOSIS — K219 Gastro-esophageal reflux disease without esophagitis: Secondary | ICD-10-CM

## 2012-01-19 DIAGNOSIS — E039 Hypothyroidism, unspecified: Secondary | ICD-10-CM

## 2012-01-19 DIAGNOSIS — E785 Hyperlipidemia, unspecified: Secondary | ICD-10-CM

## 2012-01-19 DIAGNOSIS — F411 Generalized anxiety disorder: Secondary | ICD-10-CM

## 2012-01-19 DIAGNOSIS — K7689 Other specified diseases of liver: Secondary | ICD-10-CM

## 2012-01-19 LAB — CBC WITH DIFFERENTIAL/PLATELET
Basophils Relative: 0.4 % (ref 0.0–3.0)
Eosinophils Relative: 2 % (ref 0.0–5.0)
HCT: 42.9 % (ref 36.0–46.0)
MCV: 85.9 fl (ref 78.0–100.0)
Monocytes Absolute: 0.5 10*3/uL (ref 0.1–1.0)
Neutrophils Relative %: 68.1 % (ref 43.0–77.0)
RBC: 4.99 Mil/uL (ref 3.87–5.11)
WBC: 8 10*3/uL (ref 4.5–10.5)

## 2012-01-19 LAB — LIPID PANEL
Cholesterol: 244 mg/dL — ABNORMAL HIGH (ref 0–200)
HDL: 55.8 mg/dL (ref >39.00)
VLDL: 31.6 mg/dL (ref 0.0–40.0)

## 2012-01-19 LAB — BASIC METABOLIC PANEL
BUN: 19 mg/dL (ref 6–23)
Calcium: 10 mg/dL (ref 8.4–10.5)
Creatinine, Ser: 0.7 mg/dL (ref 0.4–1.2)
GFR: 84.64 mL/min (ref >60.00)

## 2012-01-19 LAB — HEPATIC FUNCTION PANEL: Total Bilirubin: 0.9 mg/dL (ref 0.3–1.2)

## 2012-01-19 LAB — LDL CHOLESTEROL, DIRECT: Direct LDL: 167 mg/dL

## 2012-01-19 LAB — TSH: TSH: 2.54 u[IU]/mL (ref 0.35–5.50)

## 2012-01-19 MED ORDER — OMEPRAZOLE 20 MG PO CPDR: 20.0000 mg | DELAYED_RELEASE_CAPSULE | Freq: Every day | ORAL | Status: DC

## 2012-01-19 MED ORDER — AMLODIPINE BESYLATE 5 MG PO TABS: 5.0000 mg | ORAL_TABLET | Freq: Every day | ORAL | Status: DC

## 2012-01-19 MED ORDER — LEVOTHYROXINE SODIUM 75 MCG PO TABS: 75.0000 ug | ORAL_TABLET | Freq: Every day | ORAL | Status: DC

## 2012-01-19 MED ORDER — ALPRAZOLAM 0.5 MG PO TABS: 0.5000 mg | ORAL_TABLET | Freq: Three times a day (TID) | ORAL | Status: DC | PRN

## 2012-01-19 NOTE — Patient Instructions (Addendum)
Today we updated your med list in our EPIC system...    Continue your current medications the same...  Today we did your follow up CXR & FASTING blood work...    We will call you w/ the results when avail...  In addition, you are due for a follow up Bone Density test...    We will sched this for you & call w/ the report after they notify us of the results...  It is imperative that you lose 20+lbs to help your fatty liver diease 7 prevent cirrhosis of the liver...    Consider weight watchers, or any program that will get you to this goal...  Call for any questions...  Let's plan a follow up physical in 1 yr & call anytime for problems in the interim.Marland KitchenMarland Kitchen

## 2012-01-19 NOTE — Progress Notes (Addendum)
Subjective:    Patient ID: [name], female    DOB: [birth year], 70 y.o.   MRN: [id number]  HPI 71 y/o WF here for a yearly follow up visit... she has numerous medical problems as listed below...   ~  2006-2008:  her LFT's were sigif elevated and we referred her to GI/ DrGessner... eval w/ liver biopsy favored Steatohepatitis and his notes are reviewed... she has Hyperlipidemia but was intol to Lipitor and Crestor in the past and has refused to try other meds, and has refused the lipid clinic- "I'm on diet alone"... her weight is stable in the 175-180# range, despite her diet + gym work outs.  ~  January 10, 2010:  Yearly ROV doing well- without new complaints or concerns... she notes BP controlled on Amlodipine;  she is content w/ level of control of Lipids on diet alone & refuses med Rx;  GI is stable w/ f/u colonoscopy 3/11 showing no polyps;  increased LFT's w/ steatohepatitis that DrGessner feels is from Tamoxifen- he has rec weight loss (pt unsuccessful), and due to finish 92yr Tamoxifen Rx later this yr (it will be interesting to see if LFT's improve off this med)... she refuses Flu vaccines.  ~  January 15, 2011:  Yearly ROV & review- she is now off Tamoxifen per DrMagrinat after 59yr Rx- f/u LFTs now reveal ==> similar elev in SGOT/ SGPT (no improvement off the Tamoxifen), she needs f/u GI eval w/ DrGessner to underscore the importance of diet/ weight loss to decr her risk of cirrhosis due to her steatohepatitis (does she need another liver bx?)...  She notes that DrMagrinat signed off 7/11- his note is reviewed...  She is c/o hot flashes, fibromyalgia, etc> she will try black cohosh, heating pad, OTC analgesics, & her Xanax... She notes "sinusitis- all my life, just chronic stuff, allergies"...     BP controlled on meds; Chol is poorly controlled on her diet + FishOil & she refuses statin Rx> rec to the Lipid Clinic for help;  Thyroid OK on the Synth75;  Other labs- all OK...  ~  January 19, 2012:  Yearly ROV & [name] states she is doing well, no new complaints or concerns;  She is now off the Tamoxifen;  She refuses the Flu vaccines;  She declines to take meds for Chol & refuses f/u w/ GI rgarding her persistent elev LFTs...    We reviewed prob list, meds, xrays and labs> see below for updates >> CXR 4/13 showed normal heart size, clear lungs, NAD... LABS 4/13:  FLP- not at goals on FishOil & intol all statins;  Chems- ok x elev LFTs as before;  CBC- wnl;  TSH=2.54;  VitD=56;  Ca27.29=16 (norm<39) BMD 4/13 showed -2.0 in Spine, and -2.0 in right FemNeck...         Problem List:  HYPERTENSION (ICD-401.9) - controlled on AMLODIPINE 5mg /d... ~  NuclearStressTest 1997 was normal- no ischemia, no infarct, EF= 73%... ~  4/12:  BP=140/80 and similar at home... she denies HA, fatigue, visual changes, CP, palipit, dizziness, syncope, dyspnea, edema, etc... ~  CXR 4/12 showed normal heart size, sl tort Ao, clear lungs w/ mild apic pleural thickening, osteopenia... ~  4/13:  BP= 124/76 & she remains asymptomatic w/o CP, palpit, SOB, edema, etc... ~  CXR 4/13 showed normal heart size, clear lungs, NAD...  HYPERLIPIDEMIA (ICD-272.4) - on diet + FISH OIL daily... hx intol to Lipitor & Crestor. ~  FLP 1/08 showed TChol 216, TG  215, HDL 43, LDL 141... refused other statins or lipid clinic... ~  FLP 1/09 (wt=180#) showed TChol 230, TG 207, HDL 46, LDL 147... ~  FLP 3/10 (wt=177#) showed TChol 188, TG 181, HDL 51, LDL 101... improved on diet Rx. ~  FLP 4/11 (wt=178#) showed TChol 201, TG 193, HDL 53, LDL 121 ~  FLP 4/12 (wt=182#) showed TChol 263, TG 140, HDL 62, LDL 182... On diet alone, need LipidClinic help- she declines. ~  FLP 4/13 on diet alone (wt=182#) showed TChol 244, TG 158, HDL 56, LDL 167... She refuses interventions.  HYPOTHYROIDISM (ICD-244.9) - new Dx 1/08 w/ TSH=6.67... started on SYNTHROID 20mcg/d... ~  labs 1/09 showed TSH= 2.97... ~  labs 3/10 showed TSH= 3.48... continue  same dose. ~  labs 4/11 showed TSH= 4.09 ~  Labs 4/12 showed TSH= 2.61 ~  Labs 4/13 on Synth75 showed TSH= 2.54  GERD (ICD-530.81) - on PRILOSEC 20mg /d... ~  last EGD 3/05 showed sm HH and tortuous esoph...  IRRITABLE BOWEL SYNDROME (ICD-564.1) COLONIC POLYPS (ICD-211.3) - first colonoscopy in 1997 w/ sm tubular adenoma removed...  ~  f/u colon done in 2000 and neg- no polyps seen... ~  colonoscopy 2/06 by DrGessner showed hems only... ~  colonoscopy 3/11 by DrGessner was neg- no lesions seen... f/u planned 28yrs.  LIVER FUNCTION TESTS, ABNORMAL (ICD-794.8) - SEE ABOVE & eval by DrGessner... her LFT's were sigif elevated and eval w/ liver biopsy (2/08) favored Steatohepatitis and his notes are reviewed... ~  prev AbdSonar's back to 2000 showed echo-dense liver c/w fatty infiltration... CT Abd 2005 confirms. ~  LFT's 1/09 showed SGOT= 120, SGPT= 109 ~  LFT's 3/10 showed SGOT= 133, SGPT= 119... referred back to GI & Tamoxifen targeted as poss culprit. ~  LFTs 3/11 showed SGOT= 144, SGPT= 116 ~  LFTs 4/12 showed SGOT=102, SGPT= 109... Off Tamoxifen >43mo now, she will f/u w/ DrGessner/ GI (she refused). ~  LFTs 4/13 showed SGOT= 50, SGPT= 59  ADENOCARCINOMA, BREAST (ICD-174.9) - she had L breast lumpectomy 7/05 & LN biopsy, +XRT, & chemotherapy...  ~  On Tamoxifen for 69yrs & DrMagrinat's notes are reviewed> he stopped the Tamoxifen in the summer of 2011...  DEGENERATIVE JOINT DISEASE (ICD-715.90)  FIBROMYALGIA (ICD-729.1)  OSTEOPENIA (ICD-733.90) - BMD's per DrMagrinat and DrHenley... VitD level was 47 in 2008... ~  BMD 4/13 showed -2.0 in Spine, and -2.0 in right FemNeck... She declines Bisphos Rx & rec to use Calcium, MVI, VitD & wt bearing exerc. ~  Labs 4/13 showed Vit D level = 56  ANXIETY (ICD-300.00) - on ALPRAZOLAM 0.5mg Tid Prn...  Hx of SHINGLES (ICD-053.9) - she states she's had shingles twice- last 5/09 involving left T10 distrib... no signif post herpetic problems... we  discussed the indication for the Shingles vaccine.  Hx of ANEMIA (ICD-285.9) - previous anemia resolved & Hg has been normal x yrs now.   Past Surgical History  Procedure Date  . Abdominal hysterectomy   . Bilateral salpingoophorectomy 1978  . Rotator cuff repair     left  . Laparoscopic cholecystectomy   . Breast lumpectomy     left--node dissection 7/05 by Dr. Luan Moore  . Appendectomy     Outpatient Encounter Prescriptions as of 01/19/2012  Medication Sig Dispense Refill  . ALPRAZolam (XANAX) 0.5 MG tablet Take 1 tablet (0.5 mg total) by mouth 3 (three) times daily as needed for anxiety.  270 tablet  0  . amLODipine (NORVASC) 5 MG tablet Take 1 tablet (5  mg total) by mouth daily.  90 tablet  3  . aspirin (ADULT ASPIRIN EC LOW STRENGTH) 81 MG EC tablet Take 81 mg by mouth daily.        . Cholecalciferol (VITAMIN D3) 5000 UNITS CAPS Take by mouth. Once a day       . Cyanocobalamin (VITAMIN B-12) 2500 MCG SUBL Place under the tongue. Once a day       . levothyroxine (LEVOTHROID) 75 MCG tablet Take 1 tablet (75 mcg total) by mouth daily. Once a day  90 tablet  3  . Omega-3 Fatty Acids (FISH OIL) 1000 MG CAPS Take by mouth. Take 2 tablets once a day       . omeprazole (PRILOSEC) 20 MG capsule Take 1 capsule (20 mg total) by mouth daily.  90 capsule  3  . Prenatal Vit-Fe Fum-FA-Omega (ONE-A-DAY WOMENS PRENATAL) 28-0.8 & 440 MG MISC Take by mouth. Once a day       . tamoxifen (NOLVADEX) 20 MG tablet One tablet once a day         Allergies  Allergen Reactions  . Codeine     REACTION: nausea and vomiting    Review of Systems         See HPI - all other systems neg except as noted... The patient denies anorexia, fever, weight loss, weight gain, vision loss, decreased hearing, hoarseness, chest pain, syncope, dyspnea on exertion, peripheral edema, prolonged cough, headaches, hemoptysis, abdominal pain, melena, hematochezia, severe indigestion/heartburn, hematuria, incontinence, muscle  weakness, suspicious skin lesions, transient blindness, difficulty walking, depression, unusual weight change, abnormal bleeding, enlarged lymph nodes, and angioedema.     Objective:   Physical Exam     WD, WN, 71 y/o WF in NAD... GENERAL:  Alert & oriented; pleasant & cooperative. HEENT:  [place]/AT, EOM-wnl, PERRLA, EACs-clear, TMs-wnl, NOSE-clear, THROAT-clear & wnl. NECK:  Supple w/ fairROM; no JVD; normal carotid impulses w/o bruits; no thyromegaly or nodules palpated; no lymphadenopathy. CHEST:  Clear to P & A; without wheezes/ rales/ or rhonchi. HEART:  Regular Rhythm; without murmurs/ rubs/ or gallops. ABDOMEN:  Soft & nontender; normal bowel sounds; no organomegaly or masses detected. EXT: without deformities , mild arthritic changes; no varicose veins/ venous insuffic/ or edema. NEURO:  CN's intact; motor testing normal; sensory testing normal; gait normal & balance OK. DERM:  No lesions noted; no rash etc...  RADIOLOGY DATA:  Reviewed in the EPIC EMR & discussed w/ the patient...  LABORATORY DATA:  Reviewed in the EPIC EMR & discussed w/ the patient...   Assessment & Plan:   HBP>  Controlled, continue same Rx...  Hyperlipidemia>  On diet alone w/ poor control; she refuses statin Rx & I've once again encouraged her to go to the Lipid Clinic for eval...  Hypothy>  Stable on the Synthroid75...  LFTs>  Likely steatohepatitis/ fatty liver dis & she desperately need to lose wt & get her lipid under control; concern for evolution to cirrhosis & we will ask DrGessner to re-eval & point out the need for wt reduction...  Breast Cancer> she has been released by DrMagrinat...  FM>  She has mod aching, soreness, and day to day functional problems;  Uses OTC analgesics prn.Marland KitchenMarland Kitchen

## 2012-01-20 LAB — VITAMIN D 25 HYDROXY (VIT D DEFICIENCY, FRACTURES): Vit D, 25-Hydroxy: 56 ng/mL (ref 30–89)

## 2012-01-26 ENCOUNTER — Encounter: Payer: Self-pay | Admitting: Pulmonary Disease

## 2012-03-08 ENCOUNTER — Other Ambulatory Visit: Payer: Self-pay | Admitting: Pulmonary Disease

## 2012-03-08 DIAGNOSIS — Z1231 Encounter for screening mammogram for malignant neoplasm of breast: Secondary | ICD-10-CM

## 2012-03-08 DIAGNOSIS — Z853 Personal history of malignant neoplasm of breast: Secondary | ICD-10-CM

## 2012-04-02 ENCOUNTER — Ambulatory Visit
Admission: RE | Admit: 2012-04-02 | Discharge: 2012-04-02 | Disposition: A | Discharge status: Home / Self Care | Payer: Medicare Other | Source: Ambulatory Visit | Attending: Pulmonary Disease | Admitting: Pulmonary Disease

## 2012-04-02 DIAGNOSIS — Z853 Personal history of malignant neoplasm of breast: Secondary | ICD-10-CM

## 2012-04-02 DIAGNOSIS — Z1231 Encounter for screening mammogram for malignant neoplasm of breast: Secondary | ICD-10-CM

## 2013-01-20 ENCOUNTER — Encounter: Payer: Self-pay | Admitting: Pulmonary Disease

## 2013-01-20 ENCOUNTER — Ambulatory Visit (INDEPENDENT_AMBULATORY_CARE_PROVIDER_SITE_OTHER): Payer: Medicare Other | Admitting: Pulmonary Disease

## 2013-01-20 ENCOUNTER — Other Ambulatory Visit (INDEPENDENT_AMBULATORY_CARE_PROVIDER_SITE_OTHER): Payer: Medicare Other

## 2013-01-20 VITALS — BP 118/68 | HR 70 | Temp 97.4°F | Ht 65.5 in | Wt 183.4 lb

## 2013-01-20 DIAGNOSIS — E039 Hypothyroidism, unspecified: Secondary | ICD-10-CM

## 2013-01-20 DIAGNOSIS — E785 Hyperlipidemia, unspecified: Secondary | ICD-10-CM

## 2013-01-20 DIAGNOSIS — K589 Irritable bowel syndrome without diarrhea: Secondary | ICD-10-CM

## 2013-01-20 DIAGNOSIS — F411 Generalized anxiety disorder: Secondary | ICD-10-CM

## 2013-01-20 DIAGNOSIS — C50919 Malignant neoplasm of unspecified site of unspecified female breast: Secondary | ICD-10-CM

## 2013-01-20 DIAGNOSIS — I10 Essential (primary) hypertension: Secondary | ICD-10-CM

## 2013-01-20 DIAGNOSIS — M899 Disorder of bone, unspecified: Secondary | ICD-10-CM

## 2013-01-20 DIAGNOSIS — M1990 Unspecified osteoarthritis, unspecified site: Secondary | ICD-10-CM

## 2013-01-20 DIAGNOSIS — K7689 Other specified diseases of liver: Secondary | ICD-10-CM

## 2013-01-20 DIAGNOSIS — K219 Gastro-esophageal reflux disease without esophagitis: Secondary | ICD-10-CM

## 2013-01-20 DIAGNOSIS — IMO0001 Reserved for inherently not codable concepts without codable children: Secondary | ICD-10-CM

## 2013-01-20 LAB — HEPATIC FUNCTION PANEL
Albumin: 4.3 g/dL (ref 3.5–5.2)
Total Protein: 8 g/dL (ref 6.0–8.3)

## 2013-01-20 LAB — CBC WITH DIFFERENTIAL/PLATELET
Basophils Relative: 0.7 % (ref 0.0–3.0)
Hemoglobin: 14.5 g/dL (ref 12.0–15.0)
Lymphocytes Relative: 23.6 % (ref 12.0–46.0)
Monocytes Relative: 7.7 % (ref 3.0–12.0)
Neutro Abs: 5.4 10*3/uL (ref 1.4–7.7)
RBC: 4.95 Mil/uL (ref 3.87–5.11)

## 2013-01-20 LAB — BASIC METABOLIC PANEL
BUN: 17 mg/dL (ref 6–23)
Chloride: 106 mEq/L (ref 96–112)
Creatinine, Ser: 0.8 mg/dL (ref 0.4–1.2)
GFR: 73.67 mL/min (ref >60.00)
Potassium: 4 mEq/L (ref 3.5–5.1)

## 2013-01-20 LAB — LIPID PANEL
HDL: 49.9 mg/dL (ref >39.00)
Total CHOL/HDL Ratio: 5
VLDL: 27.6 mg/dL (ref 0.0–40.0)

## 2013-01-20 MED ORDER — LEVOTHYROXINE SODIUM 75 MCG PO TABS: 75.0000 ug | ORAL_TABLET | Freq: Every day | ORAL | Status: DC

## 2013-01-20 MED ORDER — ALPRAZOLAM 0.5 MG PO TABS: 0.5000 mg | ORAL_TABLET | Freq: Three times a day (TID) | ORAL | Status: DC | PRN

## 2013-01-20 MED ORDER — AMLODIPINE BESYLATE 5 MG PO TABS: 5.0000 mg | ORAL_TABLET | Freq: Every day | ORAL | Status: DC

## 2013-01-20 MED ORDER — OMEPRAZOLE 20 MG PO CPDR: 20.0000 mg | DELAYED_RELEASE_CAPSULE | Freq: Every day | ORAL | Status: DC

## 2013-01-20 NOTE — Progress Notes (Signed)
Subjective:    Patient ID: [name], female    DOB: June 12, 1941, 72 y.o.   MRN: [id number]  HPI 72 y/o WF here for a yearly follow up visit... she has numerous medical problems as listed below...   ~  2006-2008:  her LFT's were signif elevated and we referred her to GI/ DrGessner... eval w/ liver biopsy favored Steatohepatitis and his notes are reviewed... she has Hyperlipidemia but was intol to Lipitor and Crestor in the past and has refused to try other meds, and has refused the lipid clinic- "I'm on diet alone"... her weight is stable in the 175-180# range, despite her diet + gym work outs.  ~  January 10, 2010:  Yearly ROV doing well- without new complaints or concerns... she notes BP controlled on Amlodipine;  she is content w/ level of control of Lipids on diet alone & refuses med Rx;  GI is stable w/ f/u colonoscopy 3/11 showing no polyps;  increased LFT's w/ steatohepatitis that DrGessner feels is from Tamoxifen- he has rec weight loss (pt unsuccessful), and due to finish 37yr Tamoxifen Rx later this yr (it will be interesting to see if LFT's improve off this med)... she refuses Flu vaccines.  ~  January 15, 2011:  Yearly ROV & review- she is now off Tamoxifen per DrMagrinat after 60yr Rx- f/u LFTs now reveal ==> similar elev in SGOT/ SGPT (no improvement off the Tamoxifen), she needs f/u GI eval w/ DrGessner to underscore the importance of diet/ weight loss to decr her risk of cirrhosis due to her steatohepatitis (does she need another liver bx?)...  She notes that DrMagrinat signed off 7/11- his note is reviewed...  She is c/o hot flashes, fibromyalgia, etc> she will try black cohosh, heating pad, OTC analgesics, & her Xanax... She notes "sinusitis- all my life, just chronic stuff, allergies"...     BP controlled on meds; Chol is poorly controlled on her diet + FishOil & she refuses statin Rx> rec to the Lipid Clinic for help;  Thyroid OK on the Synth75;  Other labs- all OK...  ~  January 19, 2012:  Yearly ROV & [name] states she is doing well, no new complaints or concerns;  She is now off the Tamoxifen;  She refuses the Flu vaccines;  She declines to take meds for Chol & refuses f/u w/ GI regarding her persistent elev LFTs...    We reviewed prob list, meds, xrays and labs> see below for updates >> CXR 4/13 showed normal heart size, clear lungs, NAD... LABS 4/13:  FLP- not at goals on FishOil & intol all statins;  Chems- ok x elev LFTs as before;  CBC- wnl;  TSH=2.54;  VitD=56;  Ca27.29=16 (norm<39) BMD 4/13 showed -2.0 in Spine, and -2.0 in right FemNeck...  ~  January 20, 2013:  Yearly ROV & [name] notes that she is doing well- no new complaints or concerns; Daugh [place]ton Revere a 23mo ago son & her eldest daugh has 3 older children- one married & just had a child Brandt' 1st great grand);  We reviewed the following medical problems during today's office visit >>      HBP> on Amlodipine5; BP= 118/68, similar at home, denies CP/ palpit/ SOB/ edema...    Hyperlipid> on diet alone- she refuses meds for her lipids & intol Lip/ Cres; refused Lipid Clinic; FLP 4/14 shows TChol 240, TG 138, HDL 50, LDL 167    Hypothyroid> on Synthroid75; clinically euthyroid; labs 4/14 shows TSH= 1.89; continue  same...    GI- GERD, IBS, Polyps> on Prilosec20; she denies GI problems w/o abd pain, dysphagia, n/v, c/d, blood seen; last colon by DrGessner 3/11 was neg & f/u planned 31yrs.    Elev LFTs, prob hepatic steatosis> long hx elev LFTs felt to be hep steatosis her DrGessner; she has not lost wt & LFTs remain mildly elev; labs 4/14 show SGOT=50, SGPT=61    Hx Breast cancer> left breast cancer dx 2005 w/ lumpectomy & node bx, then XRT & chemoRx, then Tamoxifen for 21yrs=> stopped in mid2011 & he signed off; pt does self exams & sees Lucina Mellow yearly w/ Mammograms at the breast center & overdue now...    DJD, FM> c/o arthritis pain in her hands (uses Aleve), rests fair & Xanax helps, energy fair w/ mult trigger  points...    Osteopenia> on calcium, MVI; Tscores in 2013 were -2.0 but she declines Bisphos Rx & asked to take Calcium, MVI, VitD, & wt bearing exercise...    Anxiety> on Xanax0.5Tid prn...  We reviewed prob list, meds, xrays and labs> see below for updates >> she refuses the Flu vaccines... LABS 4/14:  FLP- not at goals on diet alone;  Chems- ok x incr LFTs;  CBC- wnl;  TSH= 1.89...          Problem List:  HYPERTENSION (ICD-401.9) - controlled on AMLODIPINE 5mg /d... ~  NuclearStressTest 1997 was normal- no ischemia, no infarct, EF= 73%... ~  4/12:  BP=140/80 and similar at home... she denies HA, fatigue, visual changes, CP, palipit, dizziness, syncope, dyspnea, edema, etc... ~  CXR 4/12 showed normal heart size, sl tort Ao, clear lungs w/ mild apic pleural thickening, osteopenia... ~  4/13:  BP= 124/76 & she remains asymptomatic w/o CP, palpit, SOB, edema, etc... ~  CXR 4/13 showed normal heart size, clear lungs, NAD... ~  4/14:  on Amlodipine5; BP= 118/68, similar at home, denies CP/ palpit/ SOB/ edema.  HYPERLIPIDEMIA (ICD-272.4) - on diet + FISH OIL daily... hx intol to Lipitor & Crestor. ~  FLP 1/08 showed TChol 216, TG 215, HDL 43, LDL 141... refused other statins or lipid clinic... ~  FLP 1/09 (wt=180#) showed TChol 230, TG 207, HDL 46, LDL 147... ~  FLP 3/10 (wt=177#) showed TChol 188, TG 181, HDL 51, LDL 101... improved on diet Rx. ~  FLP 4/11 (wt=178#) showed TChol 201, TG 193, HDL 53, LDL 121 ~  FLP 4/12 (wt=182#) showed TChol 263, TG 140, HDL 62, LDL 182... On diet alone, need LipidClinic help- she declines. ~  FLP 4/13 on diet alone (wt=182#) showed TChol 244, TG 158, HDL 56, LDL 167... She refuses interventions. ~  FLP 4/14 on diet alone (wt=183#) showed TChol 240, TG 138, HDL 50, LDL 167   HYPOTHYROIDISM (ZOX-096.0) - new Dx 1/08 w/ TSH=6.67... started on SYNTHROID 79mcg/d... ~  labs 1/09 showed TSH= 2.97... ~  labs 3/10 showed TSH= 3.48... continue same dose. ~  labs  4/11 showed TSH= 4.09 ~  Labs 4/12 showed TSH= 2.61 ~  Labs 4/13 on Synth75 showed TSH= 2.54 ~  Labs 4/14 on Synth75 showed TSH= 1.89  GERD (ICD-530.81) - on PRILOSEC 20mg /d... ~  last EGD 3/05 showed sm HH and tortuous esoph...  IRRITABLE BOWEL SYNDROME (ICD-564.1) COLONIC POLYPS (ICD-211.3) - first colonoscopy in 1997 w/ sm tubular adenoma removed...  ~  f/u colon done in 2000 and neg- no polyps seen... ~  colonoscopy 2/06 by DrGessner showed hems only... ~  colonoscopy 3/11 by DrGessner  was neg- no lesions seen... f/u planned 69yrs.  LIVER FUNCTION TESTS, ABNORMAL (ICD-794.8) - SEE ABOVE & eval by DrGessner... her LFT's were sigif elevated and eval w/ liver biopsy (2/08) favored Steatohepatitis and his notes are reviewed... ~  prev AbdSonar's back to 2000 showed echo-dense liver c/w fatty infiltration... CT Abd 2005 confirms. ~  LFT's 1/09 showed SGOT= 120, SGPT= 109 ~  LFT's 3/10 showed SGOT= 133, SGPT= 119... referred back to GI & Tamoxifen targeted as poss culprit. ~  LFTs 3/11 showed SGOT= 144, SGPT= 116 ~  LFTs 4/12 showed SGOT=102, SGPT= 109... Off Tamoxifen >54mo now, she will f/u w/ DrGessner/ GI (she refused). ~  LFTs 4/13 showed SGOT= 50, SGPT= 59 ~  LFTs 4/14 (wt=183#) showed SGOT=50, SGPT=61  ADENOCARCINOMA, BREAST (ICD-174.9) - she had L breast lumpectomy 7/05 & LN biopsy, +XRT, & chemotherapy...  ~  On Tamoxifen for 6yrs & DrMagrinat's notes are reviewed> he stopped the Tamoxifen in the summer of 2011... ~  4/14:   left breast cancer dx 2005 w/ lumpectomy & node bx, then XRT & chemoRx, then Tamoxifen for 64yrs=> stopped in mid2011 & he signed off; pt does self exams & sees Lucina Mellow yearly w/ Mammograms at the breast center & overdue now.  DEGENERATIVE JOINT DISEASE (ICD-715.90)  FIBROMYALGIA (ICD-729.1)  OSTEOPENIA (ICD-733.90) - BMD's per DrMagrinat and DrHenley... VitD level was 47 in 2008... ~  BMD 4/13 showed -2.0 in Spine, and -2.0 in right FemNeck... She  declines Bisphos Rx & rec to use Calcium, MVI, VitD & wt bearing exerc. ~  Labs 4/13 showed Vit D level = 56  ANXIETY (ICD-300.00) - on ALPRAZOLAM 0.5mg Tid Prn...  Hx of SHINGLES (ICD-053.9) - she states she's had shingles twice- last 5/09 involving left T10 distrib... no signif post herpetic problems... we discussed the indication for the Shingles vaccine.  Hx of ANEMIA (ICD-285.9) - previous anemia resolved & Hg has been normal x yrs now.   Past Surgical History  Procedure Laterality Date  . Abdominal hysterectomy    . Bilateral salpingoophorectomy  1978  . Rotator cuff repair      left  . Laparoscopic cholecystectomy    . Breast lumpectomy      left--node dissection 7/05 by Dr. Luan Moore  . Appendectomy      Outpatient Encounter Prescriptions as of 01/20/2013  Medication Sig Dispense Refill  . ALPRAZolam (XANAX) 0.5 MG tablet Take 1 tablet (0.5 mg total) by mouth 3 (three) times daily as needed for anxiety.  270 tablet  3  . amLODipine (NORVASC) 5 MG tablet Take 1 tablet (5 mg total) by mouth daily.  90 tablet  3  . aspirin (ADULT ASPIRIN EC LOW STRENGTH) 81 MG EC tablet Take 81 mg by mouth daily.        Marland Kitchen levothyroxine (LEVOTHROID) 75 MCG tablet Take 1 tablet (75 mcg total) by mouth daily.  90 tablet  3  . omeprazole (PRILOSEC) 20 MG capsule Take 1 capsule (20 mg total) by mouth daily.  90 capsule  3  . Prenatal Vit-Fe Fum-FA-Omega (ONE-A-DAY WOMENS PRENATAL) 28-0.8 & 440 MG MISC Take by mouth. Once a day       . [DISCONTINUED] Omega-3 Fatty Acids (FISH OIL) 1000 MG CAPS Take by mouth. Take 2 tablets once a day        No facility-administered encounter medications on file as of 01/20/2013.    Allergies  Allergen Reactions  . Codeine     REACTION: nausea and vomiting  Review of Systems         See HPI - all other systems neg except as noted... The patient denies anorexia, fever, weight loss, weight gain, vision loss, decreased hearing, hoarseness, chest pain, syncope,  dyspnea on exertion, peripheral edema, prolonged cough, headaches, hemoptysis, abdominal pain, melena, hematochezia, severe indigestion/heartburn, hematuria, incontinence, muscle weakness, suspicious skin lesions, transient blindness, difficulty walking, depression, unusual weight change, abnormal bleeding, enlarged lymph nodes, and angioedema.     Objective:   Physical Exam     WD, WN, 72 y/o WF in NAD... GENERAL:  Alert & oriented; pleasant & cooperative. HEENT:  [place]/AT, EOM-wnl, PERRLA, EACs-clear, TMs-wnl, NOSE-clear, THROAT-clear & wnl. NECK:  Supple w/ fairROM; no JVD; normal carotid impulses w/o bruits; no thyromegaly or nodules palpated; no lymphadenopathy. CHEST:  Clear to P & A; without wheezes/ rales/ or rhonchi. HEART:  Regular Rhythm; without murmurs/ rubs/ or gallops. ABDOMEN:  Soft & nontender; normal bowel sounds; no organomegaly or masses detected. EXT: without deformities , mild arthritic changes; no varicose veins/ venous insuffic/ or edema. NEURO:  CN's intact; motor testing normal; sensory testing normal; gait normal & balance OK. DERM:  No lesions noted; no rash etc...  RADIOLOGY DATA:  Reviewed in the EPIC EMR & discussed w/ the patient...  LABORATORY DATA:  Reviewed in the EPIC EMR & discussed w/ the patient...   Assessment & Plan:    HBP>  Controlled, continue same Rx...  Hyperlipidemia>  On diet alone w/ poor control; she refuses statin Rx & I've once again encouraged her to go to the Lipid Clinic for eval...  Hypothy>  Stable on the Synthroid75...  LFTs>  Likely steatohepatitis/ fatty liver dis & she desperately need to lose wt & get her lipid under control; concern for evolution to cirrhosis & we requested GI follow up w/ DrGessner but she refuses...  Breast Cancer> she has been released by DrMagrinat; she does self exam & sees [place] yearly, she is overdue for mammogram...  FM>  She has mod aching, soreness, and day to day functional problems;  Uses  OTC analgesics prn...   Patient's Medications  New Prescriptions   No medications on file  Previous Medications   ASPIRIN (ADULT ASPIRIN EC LOW STRENGTH) 81 MG EC TABLET    Take 81 mg by mouth daily.     PRENATAL VIT-FE FUM-FA-OMEGA (ONE-A-DAY WOMENS PRENATAL) 28-0.8 & 440 MG MISC    Take by mouth. Once a day   Modified Medications   Modified Medication Previous Medication   ALPRAZOLAM (XANAX) 0.5 MG TABLET ALPRAZolam (XANAX) 0.5 MG tablet      Take 1 tablet (0.5 mg total) by mouth 3 (three) times daily as needed for anxiety.    Take 1 tablet (0.5 mg total) by mouth 3 (three) times daily as needed for anxiety.   AMLODIPINE (NORVASC) 5 MG TABLET amLODipine (NORVASC) 5 MG tablet      Take 1 tablet (5 mg total) by mouth daily.    Take 1 tablet (5 mg total) by mouth daily.   LEVOTHYROXINE (LEVOTHROID) 75 MCG TABLET levothyroxine (LEVOTHROID) 75 MCG tablet      Take 1 tablet (75 mcg total) by mouth daily.    Take 1 tablet (75 mcg total) by mouth daily.   OMEPRAZOLE (PRILOSEC) 20 MG CAPSULE omeprazole (PRILOSEC) 20 MG capsule      Take 1 capsule (20 mg total) by mouth daily.    Take 1 capsule (20 mg total) by mouth daily.  Discontinued Medications  OMEGA-3 FATTY ACIDS (FISH OIL) 1000 MG CAPS    Take by mouth. Take 2 tablets once a day

## 2013-01-20 NOTE — Patient Instructions (Addendum)
Today we updated your med list in our EPIC system...    Continue your current medications the same...  congrats on the new grandbaby & great-grandbaby!!!  Today we did you follow up FASTING blood work...    We will contact you w/ the results when available...   Call for any questions...  Let's plan a follow up visit in 41yr, sooner if needed for problems.Marland KitchenMarland Kitchen

## 2013-01-24 ENCOUNTER — Other Ambulatory Visit: Payer: Self-pay | Admitting: Pulmonary Disease

## 2013-01-24 ENCOUNTER — Telehealth: Payer: Self-pay | Admitting: Pulmonary Disease

## 2013-01-24 DIAGNOSIS — C50919 Malignant neoplasm of unspecified site of unspecified female breast: Secondary | ICD-10-CM

## 2013-01-24 NOTE — Progress Notes (Signed)
Quick Note:  Pt aware ______ 

## 2013-01-24 NOTE — Telephone Encounter (Signed)
Notes Recorded by Michele Mcalpine, MD on 01/21/2013 at 4:24 PM Please notify patient>  FLP is not at goals on diet alone; very similar numbers to last yr & LDL=167 (goal<100); INTOL to statins and therefore needs much better diet & wt reduction... Chems are ok, sugar is normal, but LFTs still elev (&same as last yr) c/w fatty liver dis & only treatment is WEIGHT LOSS... CBC & Thyroid are WNL.Marland KitchenMarland Kitchen      Spoke with pt and notified of results per Dr. Kriste Basque . Pt verbalized understanding and denied any questions.

## 2013-02-07 ENCOUNTER — Telehealth: Payer: Self-pay | Admitting: Pulmonary Disease

## 2013-02-07 MED ORDER — DIPHENOXYLATE-ATROPINE 2.5-0.025 MG PO TABS: 1.0000 | ORAL_TABLET | Freq: Four times a day (QID) | ORAL | Status: DC | PRN

## 2013-02-07 NOTE — Telephone Encounter (Signed)
Per SN---  Suppository is usually for constipation or for nausea.  For the diarrhea try immodium otc , align once daily, activia yogurt daily, ok to call in lomotil   #12  1 po every 4 hours as needed for watery diarrhea.  thanks

## 2013-02-07 NOTE — Telephone Encounter (Signed)
I spoke with pt. She c/o watery diarrhea x 2 days. States it looks yellowish. She stated she was going to the bathroom all day yesterday and this morning she has had it 4 times already. She has been taking imodium. Denies any upset stomach. Pt is requesting suppository. Please advise SN thanks Last OV 01/20/13 Allergies  Allergen Reactions  . Codeine     REACTION: nausea and vomiting

## 2013-02-07 NOTE — Telephone Encounter (Signed)
Called spoke with patient Advised of SN's recs as stated below Pt verbalized her understanding Pt wrote down all OTC therapies Lomotil sent to verified pharmacy Pt to call back if her symptoms do not improve or worsen Nothing further needed at this time; will sign off

## 2013-04-14 ENCOUNTER — Ambulatory Visit
Admission: RE | Admit: 2013-04-14 | Discharge: 2013-04-14 | Disposition: A | Discharge status: Home / Self Care | Payer: Medicare Other | Source: Ambulatory Visit | Attending: Pulmonary Disease | Admitting: Pulmonary Disease

## 2013-04-14 DIAGNOSIS — C50919 Malignant neoplasm of unspecified site of unspecified female breast: Secondary | ICD-10-CM

## 2013-12-09 ENCOUNTER — Telehealth: Payer: Self-pay | Admitting: Pulmonary Disease

## 2013-12-13 MED ORDER — DIPHENOXYLATE-ATROPINE 2.5-0.025 MG PO TABS: 1.0000 | ORAL_TABLET | Freq: Two times a day (BID) | ORAL | Status: DC | PRN

## 2013-12-13 NOTE — Telephone Encounter (Signed)
Per SN---  Cannot use a suppository with diarrhea  Ok to call in lomotil #12  1 po every 12 hours for the watery diarrhea Clear liquids Align once daily

## 2013-12-13 NOTE — Telephone Encounter (Signed)
Pt is requesting something to be called in for her diarrhea that started yesterday.  Pt is requesting that a suppository be sent in to her pharmacy to help with this. Pt denies any abd pain, cramping, nausea or fever.  She stated that this is just a virus.  SN please advise. Thanks  Allergies  Allergen Reactions  . Codeine     REACTION: nausea and vomiting

## 2013-12-13 NOTE — Telephone Encounter (Signed)
Pt aware of recs. rx called in for lomitil

## 2014-02-01 ENCOUNTER — Other Ambulatory Visit: Payer: Self-pay | Admitting: *Deleted

## 2014-02-01 MED ORDER — LEVOTHYROXINE SODIUM 75 MCG PO TABS: 75.0000 ug | ORAL_TABLET | Freq: Every day | ORAL | Status: DC

## 2014-02-01 MED ORDER — AMLODIPINE BESYLATE 5 MG PO TABS: 5.0000 mg | ORAL_TABLET | Freq: Every day | ORAL | Status: DC

## 2014-02-08 ENCOUNTER — Ambulatory Visit: Payer: Medicare Other | Admitting: Pulmonary Disease

## 2014-02-08 ENCOUNTER — Ambulatory Visit: Payer: Medicare Other | Admitting: Family Medicine

## 2014-02-09 ENCOUNTER — Ambulatory Visit (INDEPENDENT_AMBULATORY_CARE_PROVIDER_SITE_OTHER): Payer: Medicare HMO | Admitting: Family Medicine

## 2014-02-09 ENCOUNTER — Encounter: Payer: Self-pay | Admitting: Family Medicine

## 2014-02-09 VITALS — BP 126/82 | HR 79 | Temp 98.0°F | Ht 65.25 in | Wt 184.5 lb

## 2014-02-09 DIAGNOSIS — E785 Hyperlipidemia, unspecified: Secondary | ICD-10-CM

## 2014-02-09 DIAGNOSIS — I10 Essential (primary) hypertension: Secondary | ICD-10-CM

## 2014-02-09 DIAGNOSIS — M949 Disorder of cartilage, unspecified: Principal | ICD-10-CM

## 2014-02-09 DIAGNOSIS — C50919 Malignant neoplasm of unspecified site of unspecified female breast: Secondary | ICD-10-CM

## 2014-02-09 DIAGNOSIS — M899 Disorder of bone, unspecified: Secondary | ICD-10-CM

## 2014-02-09 DIAGNOSIS — E039 Hypothyroidism, unspecified: Secondary | ICD-10-CM

## 2014-02-09 DIAGNOSIS — IMO0001 Reserved for inherently not codable concepts without codable children: Secondary | ICD-10-CM

## 2014-02-09 DIAGNOSIS — K7689 Other specified diseases of liver: Secondary | ICD-10-CM

## 2014-02-09 LAB — LIPID PANEL
CHOLESTEROL: 221 mg/dL — AB (ref 0–200)
HDL: 46.3 mg/dL (ref >39.00)
LDL Cholesterol: 134 mg/dL — ABNORMAL HIGH (ref 0–99)
TRIGLYCERIDES: 203 mg/dL — AB (ref 0.0–149.0)
Total CHOL/HDL Ratio: 5
VLDL: 40.6 mg/dL — ABNORMAL HIGH (ref 0.0–40.0)

## 2014-02-09 LAB — COMPREHENSIVE METABOLIC PANEL
ALBUMIN: 4.1 g/dL (ref 3.5–5.2)
ALK PHOS: 81 U/L (ref 39–117)
ALT: 79 U/L — ABNORMAL HIGH (ref 0–35)
AST: 71 U/L — ABNORMAL HIGH (ref 0–37)
BUN: 16 mg/dL (ref 6–23)
CALCIUM: 10.1 mg/dL (ref 8.4–10.5)
CHLORIDE: 106 meq/L (ref 96–112)
CO2: 22 mEq/L (ref 19–32)
Creatinine, Ser: 0.8 mg/dL (ref 0.4–1.2)
GFR: 72.42 mL/min (ref >60.00)
GLUCOSE: 87 mg/dL (ref 70–99)
Potassium: 4.3 mEq/L (ref 3.5–5.1)
Sodium: 136 mEq/L (ref 135–145)
Total Bilirubin: 0.9 mg/dL (ref 0.2–1.2)
Total Protein: 7.7 g/dL (ref 6.0–8.3)

## 2014-02-09 LAB — TSH: TSH: 1.76 u[IU]/mL (ref 0.35–4.50)

## 2014-02-09 LAB — T4, FREE: Free T4: 1.19 ng/dL (ref 0.60–1.60)

## 2014-02-09 MED ORDER — AMLODIPINE BESYLATE 5 MG PO TABS: 5.0000 mg | ORAL_TABLET | Freq: Every day | ORAL | Status: DC

## 2014-02-09 MED ORDER — OMEPRAZOLE 20 MG PO CPDR: 20.0000 mg | DELAYED_RELEASE_CAPSULE | Freq: Every day | ORAL | Status: DC

## 2014-02-09 MED ORDER — ALPRAZOLAM 0.5 MG PO TABS: 0.5000 mg | ORAL_TABLET | Freq: Three times a day (TID) | ORAL | Status: DC | PRN

## 2014-02-09 MED ORDER — LEVOTHYROXINE SODIUM 75 MCG PO TABS: 75.0000 ug | ORAL_TABLET | Freq: Every day | ORAL | Status: DC

## 2014-02-09 NOTE — Assessment & Plan Note (Signed)
Takes as needed NSAIDs, xanax.

## 2014-02-09 NOTE — Progress Notes (Signed)
Subjective:   Patient ID: [name], female    DOB: December 15, 1940, 73 y.o.   MRN: [id number]  [name] is a pleasant 73 y.o. year old female who presents to clinic today with [place]  on 02/09/2014  HPI: Establishing care from Dr. Lenna Gilford. Last saw him in 12/2012- chart reviewed extensively.  HTN- on Norvasc 5 mg daily. Denies CP, SOB or LE edema. Lab Results  Component Value Date   CREATININE 0.8 01/20/2013    Hypothyroidism- has been on synthroid 75 mcg daily. Denies any symptoms of hypo or hyperthyroidism. Lab Results  Component Value Date   TSH 1.89 01/20/2013     HLD- has refused statins.  Working on diet. Lab Results  Component Value Date   CHOL 240* 01/20/2013   HDL 49.90 01/20/2013   LDLCALC 101* 12/08/2008   LDLDIRECT 166.9 01/20/2013   TRIG 138.0 01/20/2013   CHOLHDL 5 01/20/2013   H/o elevated LFTs- prob hepatic steatosis.  Saw Dr. Carlean Purl, notes reviewed. Lab Results  Component Value Date   ALT 61* 01/20/2013   AST 50* 01/20/2013   ALKPHOS 89 01/20/2013   BILITOT 0.7 01/20/2013   H/o breast CA- diagnosed in 2005.  S/p lumpectomy and node vx. S/p chemo and 5 years of tamoxifen.  Sees Dr. Ulanda Edison (gyn).  Patient Active Problem List   Diagnosis Date Noted  . Need for diphtheria-tetanus-pertussis (Tdap) vaccine 01/15/2011  . FATTY LIVER DISEASE 11/27/2009  . COLONIC POLYPS, ADENOMATOUS, HX OF 11/27/2009  . SHINGLES 12/10/2008  . ADENOCARCINOMA, BREAST 10/21/2007  . HYPOTHYROIDISM 10/21/2007  . ANEMIA 10/21/2007  . DEGENERATIVE JOINT DISEASE 10/21/2007  . OSTEOPENIA 10/21/2007  . HYPERLIPIDEMIA 09/17/2007  . ANXIETY 09/17/2007  . HYPERTENSION 09/17/2007  . GERD 09/17/2007  . IRRITABLE BOWEL SYNDROME 09/17/2007  . FIBROMYALGIA 09/17/2007   Past Medical History  Diagnosis Date  . Hypertension   . Hyperlipidemia   . Hypothyroidism   . GERD (gastroesophageal reflux disease)   . IBS (irritable bowel syndrome)   . Colonic polyp     5 mm  adenoma 1977  . NASH (nonalcoholic steatohepatitis)   . Adenocarcinoma, breast   . Degenerative joint disease   . Fibromyalgia   . Osteopenia   . Anxiety   . History of shingles   . History of anemia   . Hemorrhoids    Past Surgical History  Procedure Laterality Date  . Abdominal hysterectomy    . Bilateral salpingoophorectomy  1978  . Rotator cuff repair      left  . Laparoscopic cholecystectomy    . Breast lumpectomy      left--node dissection 7/05 by Dr. Charlynne Pander  . Appendectomy     History  Substance Use Topics  . Smoking status: Former Smoker -- 0.20 packs/day for 7 years    Types: Cigarettes    Quit date: 09/29/1970  . Smokeless tobacco: Never Used     Comment: 1 pack per week  . Alcohol Use: Yes     Comment: socially   Family History  Problem Relation Age of Onset  . Prostate cancer Father   . Prostate cancer Brother   . Stroke Mother   . Aneurysm Brother   . Emphysema Sister   . Crohn's disease Sister   . Liver disease Brother   . Hypertension Brother    Allergies  Allergen Reactions  . Codeine     REACTION: nausea and vomiting   Current Outpatient Prescriptions on File Prior to Visit  Medication Sig Dispense Refill  . ALPRAZolam (XANAX) 0.5 MG tablet Take 1 tablet (0.5 mg total) by mouth 3 (three) times daily as needed for anxiety.  270 tablet  3  . amLODipine (NORVASC) 5 MG tablet Take 1 tablet (5 mg total) by mouth daily.  30 tablet  0  . aspirin (ADULT ASPIRIN EC LOW STRENGTH) 81 MG EC tablet Take 81 mg by mouth daily.        Marland Kitchen levothyroxine (LEVOTHROID) 75 MCG tablet Take 1 tablet (75 mcg total) by mouth daily.  30 tablet  0  . omeprazole (PRILOSEC) 20 MG capsule Take 1 capsule (20 mg total) by mouth daily.  90 capsule  3  . Prenatal Vit-Fe Fum-FA-Omega (ONE-A-DAY WOMENS PRENATAL) 28-0.8 & 440 MG MISC Take by mouth. Once a day        No current facility-administered medications on file prior to visit.   The PMH, PSH, Social History, Family  History, Medications, and allergies have been reviewed in [facility], and have been updated if relevant.   Review of Systems    See HPI  + bilateral hip and back pain- diagnosed with FM, OA in hands Objective:    BP 126/82  Pulse 79  Temp(Src) 98 F (36.7 C) (Oral)  Ht 5' 5.25" (1.657 m)  Wt 184 lb 8 oz (83.689 kg)  BMI 30.48 kg/m2  SpO2 97%   Physical Exam    General:  Well-developed,well-nourished,in no acute distress; alert,appropriate and cooperative throughout examination Head:  normocephalic and atraumatic.   Lungs:  Normal respiratory effort, chest expands symmetrically. Lungs are clear to auscultation, no crackles or wheezes. Heart:  Normal rate and regular rhythm. S1 and S2 normal without gallop, murmur, click, rub or other extra sounds. Abdomen:  Bowel sounds positive,abdomen soft and non-tender without masses, organomegaly or hernias noted. Msk:  No deformity or scoliosis noted of thoracic or lumbar spine.   Extremities:  No clubbing, cyanosis, edema, or deformity noted with normal full range of motion of all joints.   Neurologic:  alert & oriented X3 and gait normal.   Skin:  Intact without suspicious lesions or rashes Cervical Nodes:  No lymphadenopathy noted Axillary Nodes:  No palpable lymphadenopathy Psych:  Cognition and judgment appear intact. Alert and cooperative with normal attention span and concentration. No apparent delusions, illusions, hallucinations      Assessment & Plan:   No diagnosis found. No Follow-up on file.

## 2014-02-09 NOTE — Assessment & Plan Note (Signed)
Diet controlled. Check labs today. 

## 2014-02-09 NOTE — Progress Notes (Signed)
Pre visit review using our clinic review tool, if applicable. No additional management support is needed unless otherwise documented below in the visit note. 

## 2014-02-09 NOTE — Assessment & Plan Note (Signed)
Clinically euthyroid. Will check labs today.

## 2014-02-09 NOTE — Patient Instructions (Signed)
It was nice to meet you. We will call you with your lab results.

## 2014-02-09 NOTE — Assessment & Plan Note (Signed)
Coming up on 10 years cancer free! UTD on mammograms- due again in June. Dr. Ulanda Edison manages.

## 2014-02-09 NOTE — Assessment & Plan Note (Signed)
Well controlled on current rx. No changes. 

## 2014-02-09 NOTE — Addendum Note (Signed)
Addended by: Lucille Passy on: 02/09/2014 12:22 PM   Modules accepted: Orders

## 2014-02-10 ENCOUNTER — Encounter: Payer: Self-pay | Admitting: *Deleted

## 2014-02-10 ENCOUNTER — Telehealth: Payer: Self-pay | Admitting: Family Medicine

## 2014-02-10 NOTE — Telephone Encounter (Signed)
Relevant patient education assigned to patient using Emmi. ° °

## 2014-04-10 ENCOUNTER — Other Ambulatory Visit: Payer: Self-pay

## 2014-04-10 DIAGNOSIS — Z1231 Encounter for screening mammogram for malignant neoplasm of breast: Secondary | ICD-10-CM

## 2014-04-17 ENCOUNTER — Ambulatory Visit (INDEPENDENT_AMBULATORY_CARE_PROVIDER_SITE_OTHER)
Admission: RE | Admit: 2014-04-17 | Discharge: 2014-04-17 | Disposition: A | Discharge status: Home / Self Care | Payer: Medicare HMO | Source: Ambulatory Visit | Attending: Family Medicine | Admitting: Family Medicine

## 2014-04-17 ENCOUNTER — Encounter: Payer: Self-pay | Admitting: Family Medicine

## 2014-04-17 ENCOUNTER — Ambulatory Visit (INDEPENDENT_AMBULATORY_CARE_PROVIDER_SITE_OTHER): Payer: Medicare HMO | Admitting: Family Medicine

## 2014-04-17 VITALS — BP 110/70 | HR 75 | Temp 97.9°F | Ht 65.25 in | Wt 183.5 lb

## 2014-04-17 DIAGNOSIS — M2391 Unspecified internal derangement of right knee: Secondary | ICD-10-CM

## 2014-04-17 DIAGNOSIS — M2390 Unspecified internal derangement of unspecified knee: Secondary | ICD-10-CM

## 2014-04-17 DIAGNOSIS — M25569 Pain in unspecified knee: Secondary | ICD-10-CM

## 2014-04-17 DIAGNOSIS — M25561 Pain in right knee: Secondary | ICD-10-CM

## 2014-04-17 NOTE — Progress Notes (Signed)
[place] [address] Phone: [phone] Fax: [phone]  Patient ID: [name] MRN: [id number], DOB: 10-31-1940, 73 y.o. Date of Encounter: 04/17/2014  Primary Physician:  Arnette Norris, MD   Chief Complaint: Leg Pain   Subjective:   History of Present Illness:  [name] is a 73 y.o. very pleasant female patient who presents with the following:  R leg. Lost her balanace and lost her balance Whole leg twisted all the way down and sometimes all the way down.  1 months ago. She has had some significant difficulty walking on her leg, she has been limping, and she has had some occasional swelling around her knee. She has had some adjacent pain above her knee and below her knee. She is having difficulty twisting it. Prior to then she has no significant history of prior trauma or injury. No prior fracture.  Basic patellar brace and emu oil.   Baker's cyst.   Past Medical History, Surgical History, Social History, Family History, Problem List, Medications, and Allergies have been reviewed and updated if relevant.  Review of Systems:  GEN: No fevers, chills. Nontoxic. Primarily MSK c/o today. MSK: Detailed in the HPI GI: tolerating PO intake without difficulty Neuro: No numbness, parasthesias, or tingling associated. Otherwise the pertinent positives of the ROS are noted above.   Objective:   Physical Examination: BP 110/70  Pulse 75  Temp(Src) 97.9 F (36.6 C) (Oral)  Ht 5' 5.25" (1.657 m)  Wt 183 lb 8 oz (83.235 kg)  BMI 30.32 kg/m2   GEN: WDWN, NAD, Non-toxic, Alert & Oriented x 3 HEENT: Atraumatic, Normocephalic.  Ears and Nose: No external deformity. EXTR: No clubbing/cyanosis/edema PSYCH: Normally interactive. Conversant. Not depressed or anxious appearing.  Calm demeanor.   Knee:  0-100, R Gait: antalgia Effusion: mild Echymosis or edema: none Patellar tendon NT Painful PLICA: neg Patellar grind: negative Medial and  lateral patellar facet loading: negative medial and lateral joint lines: joint line tenderness Mcmurray's pos for pain Flexion-pinch pain Varus and valgus stress: stable Lachman: neg Ant and Post drawer: neg Hip abduction, IR, ER: WNL Hip flexion str: 5/5 Hip abd: 5/5 Quad: 5/5 VMO atrophy:No Hamstring concentric and eccentric: 5/5   Radiology: Dg Knee Ap/lat W/sunrise Right  04/17/2014   CLINICAL DATA:  Knee pain.  Twisting injury.  EXAM: DG KNEE - 3 VIEWS  COMPARISON:  None  FINDINGS: No evidence of fracture. No degenerative change. No joint effusion. No focal lesion.  IMPRESSION: Normal radiographs.   Electronically Signed   By: Nelson Chimes M.D.   On: 04/17/2014 14:42    Assessment & Plan:   Derangement, knee internal, right  Right knee pain - Plan: DG Knee AP/LAT W/Sunrise Right  The patient's LEFT knee moves great, her RIGHT knee x-rays look normal. This is most likely an acute injury, most likely meniscal tear. We are going to give her a conservative challenge. Inject her RIGHT knee, I certainly at the end of the day, continue with her brace, and I recommended that she use a cane in her opposite hand.  Knee Injection, RIGHT Patient verbally consented to procedure. Risks (including potential rare risk of infection), benefits, and alternatives explained. Sterilely prepped with Chloraprep. Ethyl cholride used for anesthesia. 8 cc Lidocaine 1% mixed with Depo-Medrol 80 mg injected using the anteromedial approach without difficulty. No complications with procedure and tolerated well. Patient had decreased pain post-injection.   New Prescriptions   No medications on file  Modified Medications   No medications on file   Orders Placed This Encounter  Procedures  . DG Knee AP/LAT W/Sunrise Right   Follow-up: Return in about 1 month (around 05/18/2014). Unless noted above, the patient is to follow-up if symptoms worsen. Red flags were reviewed with the  patient.  Signed,  Maud Deed. Yarexi Pawlicki, MD, CA[facility] Sports Medicine   Discontinued Medications   PRENATAL VIT-FE FUM-FA-OMEGA (ONE-A-DAY WOMENS PRENATAL) 28-0.8 & 440 MG MISC    Take by mouth. Once a day    Current Medications at Discharge:   Medication List       This list is accurate as of: 04/17/14 11:59 PM.  Always use your most recent med list.               ADULT ASPIRIN EC LOW STRENGTH 81 MG EC tablet  Generic drug:  aspirin  Take 81 mg by mouth daily.     ALPRAZolam 0.5 MG tablet  Commonly known as:  XANAX  Take 1 tablet (0.5 mg total) by mouth 3 (three) times daily as needed for anxiety.     amLODipine 5 MG tablet  Commonly known as:  NORVASC  Take 1 tablet (5 mg total) by mouth daily.     levothyroxine 75 MCG tablet  Commonly known as:  LEVOTHROID  Take 1 tablet (75 mcg total) by mouth daily.     omeprazole 20 MG capsule  Commonly known as:  PRILOSEC  Take 1 capsule (20 mg total) by mouth daily.     ONE-A-DAY WOMENS FORMULA PO  Take 1 tablet by mouth daily.     Vitamin D3 3000 UNITS Tabs  Take 1 tablet by mouth daily.

## 2014-04-17 NOTE — Progress Notes (Signed)
Pre visit review using our clinic review tool, if applicable. No additional management support is needed unless otherwise documented below in the visit note. 

## 2014-04-17 NOTE — Patient Instructions (Signed)
CANE  Ice in evening

## 2014-04-24 ENCOUNTER — Ambulatory Visit
Admission: RE | Admit: 2014-04-24 | Discharge: 2014-04-24 | Disposition: A | Discharge status: Home / Self Care | Payer: Medicare HMO | Source: Ambulatory Visit

## 2014-04-24 DIAGNOSIS — Z1231 Encounter for screening mammogram for malignant neoplasm of breast: Secondary | ICD-10-CM

## 2014-05-01 ENCOUNTER — Ambulatory Visit: Payer: Medicare HMO | Admitting: Family Medicine

## 2014-05-15 ENCOUNTER — Encounter: Payer: Medicare HMO | Admitting: Family Medicine

## 2014-05-18 ENCOUNTER — Ambulatory Visit (INDEPENDENT_AMBULATORY_CARE_PROVIDER_SITE_OTHER): Payer: Medicare HMO | Admitting: Family Medicine

## 2014-05-18 ENCOUNTER — Encounter: Payer: Self-pay | Admitting: Family Medicine

## 2014-05-18 ENCOUNTER — Ambulatory Visit: Payer: Medicare HMO | Admitting: Family Medicine

## 2014-05-18 ENCOUNTER — Encounter (INDEPENDENT_AMBULATORY_CARE_PROVIDER_SITE_OTHER): Payer: Self-pay

## 2014-05-18 VITALS — BP 130/72 | HR 76 | Temp 97.9°F | Ht 65.25 in | Wt 182.5 lb

## 2014-05-18 DIAGNOSIS — M25569 Pain in unspecified knee: Secondary | ICD-10-CM

## 2014-05-18 DIAGNOSIS — M25561 Pain in right knee: Secondary | ICD-10-CM

## 2014-05-18 NOTE — Progress Notes (Signed)
[place] [address] Phone: [phone] Fax: [phone]  Patient ID: [name] MRN: [id number], DOB: 02/29/1941, 73 y.o. Date of Encounter: 05/18/2014  Primary Physician:  Arnette Norris, MD   Chief Complaint: Follow-up   Subjective:   History of Present Illness:  [name] is a 73 y.o. very pleasant female patient who presents with the following:  She certainly has an excellent history for potential right meniscal tear, which is what she had previously. Status post injection, one month, the patient has done really quite well, she has minimal pain at this point. She is able to do anything she wants. She does have some pain with terminal flexion in motion, but otherwise is basically doing okay. She is dramatically improved compared to initial evaluation.  04/17/2014 Last OV with Owens Loffler, MD  R leg. Lost her balanace and lost her balance Whole leg twisted all the way down and sometimes all the way down.  1 months ago. She has had some significant difficulty walking on her leg, she has been limping, and she has had some occasional swelling around her knee. She has had some adjacent pain above her knee and below her knee. She is having difficulty twisting it. Prior to then she has no significant history of prior trauma or injury. No prior fracture.  Basic patellar brace and emu oil.   Baker's cyst.   Past Medical History, Surgical History, Social History, Family History, Problem List, Medications, and Allergies have been reviewed and updated if relevant.  Review of Systems:  GEN: No fevers, chills. Nontoxic. Primarily MSK c/o today. MSK: Detailed in the HPI GI: tolerating PO intake without difficulty Neuro: No numbness, parasthesias, or tingling associated. Otherwise the pertinent positives of the ROS are noted above.   Objective:   Physical Examination: BP 130/72  Pulse 76  Temp(Src) 97.9 F (36.6 C) (Oral)  Ht 5' 5.25" (1.657 m)   Wt 182 lb 8 oz (82.781 kg)  BMI 30.15 kg/m2   GEN: WDWN, NAD, Non-toxic, Alert & Oriented x 3 HEENT: Atraumatic, Normocephalic.  Ears and Nose: No external deformity. EXTR: No clubbing/cyanosis/edema PSYCH: Normally interactive. Conversant. Not depressed or anxious appearing.  Calm demeanor.   Knee:  0-120, R Gait: antalgia Effusion: mild Echymosis or edema: none Patellar tendon NT Painful PLICA: neg Patellar grind: negative Medial and lateral patellar facet loading: negative medial and lateral joint lines: joint line tenderness, mild Mcmurray's neg Flexion-pinch neg Varus and valgus stress: stable Lachman: neg Ant and Post drawer: neg Hip abduction, IR, ER: WNL Hip flexion str: 5/5 Hip abd: 5/5 Quad: 5/5 VMO atrophy:No Hamstring concentric and eccentric: 5/5   Radiology: Dg Knee Ap/lat W/sunrise Right  04/17/2014   CLINICAL DATA:  Knee pain.  Twisting injury.  EXAM: DG KNEE - 3 VIEWS  COMPARISON:  None  FINDINGS: No evidence of fracture. No degenerative change. No joint effusion. No focal lesion.  IMPRESSION: Normal radiographs.   Electronically Signed   By: Nelson Chimes M.D.   On: 04/17/2014 14:42    Assessment & Plan:   Right knee pain  Much improved. Followup when necessary.  04/17/2014 Last OV with Owens Loffler, MD  The patient's LEFT knee moves great, her RIGHT knee x-rays look normal. This is most likely an acute injury, most likely meniscal tear. We are going to give her a conservative challenge. Inject her RIGHT knee, I certainly at the end of the day, continue with her brace, and I recommended that  she use a cane in her opposite hand.  Signed,  Maud Deed. Kumiko Fishman, MD, [place]

## 2014-05-18 NOTE — Progress Notes (Signed)
Pre visit review using our clinic review tool, if applicable. No additional management support is needed unless otherwise documented below in the visit note. 

## 2014-06-24 ENCOUNTER — Encounter: Payer: Self-pay | Admitting: Internal Medicine

## 2014-10-05 ENCOUNTER — Ambulatory Visit (INDEPENDENT_AMBULATORY_CARE_PROVIDER_SITE_OTHER): Payer: Medicare HMO | Admitting: Family Medicine

## 2014-10-05 ENCOUNTER — Encounter: Payer: Self-pay | Admitting: Family Medicine

## 2014-10-05 VITALS — BP 122/68 | HR 84 | Temp 98.1°F | Wt 185.5 lb

## 2014-10-05 DIAGNOSIS — J069 Acute upper respiratory infection, unspecified: Secondary | ICD-10-CM

## 2014-10-05 MED ORDER — AZITHROMYCIN 250 MG PO TABS: ORAL_TABLET | ORAL | Status: DC

## 2014-10-05 NOTE — Patient Instructions (Signed)
Good to see you. Take antibiotic (zpack) as directed.  Drink lots of fluids.  You can try over the counter nasocort-start with 2 sprays per nostril per day...and then try to taper to 1 spray per nostril once symptoms improve.   You can use warm compresses.  Cough suppressant as needed.   Call if not improving as expected in 5-7 days.

## 2014-10-05 NOTE — Progress Notes (Signed)
Pre visit review using our clinic review tool, if applicable. No additional management support is needed unless otherwise documented below in the visit note. 

## 2014-10-05 NOTE — Progress Notes (Signed)
SUBJECTIVE:  [name] is a 74 y.o. female who complains of coryza, congestion, sneezing, sore throat, productive cough and chills for 8 days. She denies a history of chest pain, vomiting, weakness and weight loss and denies a history of asthma. Patient denies smoke cigarettes.  OTC Tussin is helping a little with the cough.    Current Outpatient Prescriptions on File Prior to Visit  Medication Sig Dispense Refill  . ALPRAZolam (XANAX) 0.5 MG tablet Take 1 tablet (0.5 mg total) by mouth 3 (three) times daily as needed for anxiety. 270 tablet 2  . amLODipine (NORVASC) 5 MG tablet Take 1 tablet (5 mg total) by mouth daily. 90 tablet 3  . aspirin (ADULT ASPIRIN EC LOW STRENGTH) 81 MG EC tablet Take 81 mg by mouth daily.      . Cholecalciferol (VITAMIN D3) 3000 UNITS TABS Take 1 tablet by mouth daily.    Marland Kitchen levothyroxine (LEVOTHROID) 75 MCG tablet Take 1 tablet (75 mcg total) by mouth daily. 90 tablet 3  . Multiple Vitamins-Calcium (ONE-A-DAY WOMENS FORMULA PO) Take 1 tablet by mouth daily.    Marland Kitchen omeprazole (PRILOSEC) 20 MG capsule Take 1 capsule (20 mg total) by mouth daily. 90 capsule 3   No current facility-administered medications on file prior to visit.    Allergies  Allergen Reactions  . Codeine     REACTION: nausea and vomiting    Past Medical History  Diagnosis Date  . Hypertension   . Hyperlipidemia   . Hypothyroidism   . GERD (gastroesophageal reflux disease)   . IBS (irritable bowel syndrome)   . Colonic polyp     5 mm adenoma 1977  . NASH (nonalcoholic steatohepatitis)   . Adenocarcinoma, breast   . Degenerative joint disease   . Fibromyalgia   . Osteopenia   . Anxiety   . History of shingles   . History of anemia   . Hemorrhoids     Past Surgical History  Procedure Laterality Date  . Abdominal hysterectomy    . Bilateral salpingoophorectomy  1978  . Rotator cuff repair      left  . Laparoscopic cholecystectomy    . Breast lumpectomy      left--node  dissection 7/05 by Dr. Charlynne Pander  . Appendectomy      Family History  Problem Relation Age of Onset  . Prostate cancer Father   . Prostate cancer Brother   . Stroke Mother   . Aneurysm Brother   . Emphysema Sister   . Crohn's disease Sister   . Liver disease Brother   . Hypertension Brother     History   Social History  . Marital Status: Married    Spouse Name: [name]    Number of Children: 2  . Years of Education: N/A   Occupational History  . retired    Social History Main Topics  . Smoking status: Former Smoker -- 0.20 packs/day for 7 years    Types: Cigarettes    Quit date: 09/29/1970  . Smokeless tobacco: Never Used     Comment: 1 pack per week  . Alcohol Use: Yes     Comment: socially  . Drug Use: No  . Sexual Activity: Yes   Other Topics Concern  . Not on file   Social History Narrative   The PMH, PSH, Social History, Family History, Medications, and allergies have been reviewed in [facility], and have been updated if relevant.  OBJECTIVE: BP 122/68 mmHg  Pulse 84  Temp(Src)  98.1 F (36.7 C) (Oral)  Wt 185 lb 8 oz (84.142 kg)  SpO2 97%  She appears well, vital signs are as noted. Ears normal.  Throat and pharynx normal.  Neck supple. No adenopathy in the neck. Nose is congested. Sinuses  tender. Scattered exp wheezes  ASSESSMENT:  sinusitis and bronchitis  PLAN: Given duration and progression of symptoms, will treat for bacterial sinusitis/bronchitis with Zpack.  Symptomatic therapy suggested: push fluids, rest and return office visit prn if symptoms persist or worsen.. Call or return to clinic prn if these symptoms worsen or fail to improve as anticipated.

## 2015-02-21 ENCOUNTER — Encounter: Payer: Self-pay | Admitting: Primary Care

## 2015-02-21 ENCOUNTER — Ambulatory Visit (INDEPENDENT_AMBULATORY_CARE_PROVIDER_SITE_OTHER): Payer: Medicare HMO | Admitting: Primary Care

## 2015-02-21 VITALS — BP 146/70 | HR 80 | Temp 98.6°F | Ht 65.25 in | Wt 190.1 lb

## 2015-02-21 DIAGNOSIS — R350 Frequency of micturition: Secondary | ICD-10-CM

## 2015-02-21 LAB — POCT URINALYSIS DIPSTICK
Bilirubin, UA: NEGATIVE
Blood, UA: NEGATIVE
Glucose, UA: NEGATIVE
Ketones, UA: NEGATIVE
Nitrite, UA: POSITIVE
Protein, UA: NEGATIVE
Spec Grav, UA: 1.01
Urobilinogen, UA: 4
pH, UA: 6

## 2015-02-21 MED ORDER — CEPHALEXIN 500 MG PO CAPS: 500.0000 mg | ORAL_CAPSULE | Freq: Two times a day (BID) | ORAL | Status: DC

## 2015-02-21 MED ORDER — PHENAZOPYRIDINE HCL 200 MG PO TABS: 200.0000 mg | ORAL_TABLET | Freq: Three times a day (TID) | ORAL | Status: DC | PRN

## 2015-02-21 NOTE — Patient Instructions (Addendum)
You have a urinary tract infection. Start Cephalexin antibiotics. Take 1 capsule by mouth twice daily for 7 days. You may also take Pyridium tablets three times daily as needed for pressure, burning, and frequency of urine. Call me if you are not feeling better in 3-4 days. It was nice meeting you!  Urinary Tract Infection Urinary tract infections (UTIs) can develop anywhere along your urinary tract. Your urinary tract is your body's drainage system for removing wastes and extra water. Your urinary tract includes two kidneys, two ureters, a bladder, and a urethra. Your kidneys are a pair of bean-shaped organs. Each kidney is about the size of your fist. They are located below your ribs, one on each side of your spine. CAUSES Infections are caused by microbes, which are microscopic organisms, including fungi, viruses, and bacteria. These organisms are so small that they can only be seen through a microscope. Bacteria are the microbes that most commonly cause UTIs. SYMPTOMS  Symptoms of UTIs may vary by age and gender of the patient and by the location of the infection. Symptoms in young women typically include a frequent and intense urge to urinate and a painful, burning feeling in the bladder or urethra during urination. Older women and men are more likely to be tired, shaky, and weak and have muscle aches and abdominal pain. A fever may mean the infection is in your kidneys. Other symptoms of a kidney infection include pain in your back or sides below the ribs, nausea, and vomiting. DIAGNOSIS To diagnose a UTI, your caregiver will ask you about your symptoms. Your caregiver also will ask to provide a urine sample. The urine sample will be tested for bacteria and white blood cells. White blood cells are made by your body to help fight infection. TREATMENT  Typically, UTIs can be treated with medication. Because most UTIs are caused by a bacterial infection, they usually can be treated with the use of  antibiotics. The choice of antibiotic and length of treatment depend on your symptoms and the type of bacteria causing your infection. HOME CARE INSTRUCTIONS  If you were prescribed antibiotics, take them exactly as your caregiver instructs you. Finish the medication even if you feel better after you have only taken some of the medication.  Drink enough water and fluids to keep your urine clear or pale yellow.  Avoid caffeine, tea, and carbonated beverages. They tend to irritate your bladder.  Empty your bladder often. Avoid holding urine for long periods of time.  Empty your bladder before and after sexual intercourse.  After a bowel movement, women should cleanse from front to back. Use each tissue only once. SEEK MEDICAL CARE IF:   You have back pain.  You develop a fever.  Your symptoms do not begin to resolve within 3 days. SEEK IMMEDIATE MEDICAL CARE IF:   You have severe back pain or lower abdominal pain.  You develop chills.  You have nausea or vomiting.  You have continued burning or discomfort with urination. MAKE SURE YOU:   Understand these instructions.  Will watch your condition.  Will get help right away if you are not doing well or get worse. Document Released: 06/25/2005 Document Revised: 03/16/2012 Document Reviewed: 10/24/2011 [facility] Patient Information 2015 [place], [place]. This information is not intended to replace advice given to you by your health care provider. Make sure you discuss any questions you have with your health care provider.

## 2015-02-21 NOTE — Progress Notes (Signed)
Pre visit review using our clinic review tool, if applicable. No additional management support is needed unless otherwise documented below in the visit note. 

## 2015-02-21 NOTE — Progress Notes (Signed)
Subjective:    Patient ID: MARTINIQUE PIZZIMENTI, female    DOB: Jul 21, 1941, 74 y.o.   MRN: [id number]  HPI  Ms. Ozaki is a 74 year old female who presents today with a chief complaint of urinary frequency that has been present for 10 days. She also reports pelvic pressure, slight dysuria, and cloudy appearance to her urine. Denies vaginal discharge, fevers, chills. She's increased her intake of water and pomegranate juice recently with some reduction in symptoms temporarily. She's not taken any medication OTC.  Review of Systems  Constitutional: Negative for fever and chills.  Respiratory: Negative for shortness of breath.   Cardiovascular: Negative for chest pain.  Genitourinary: Positive for dysuria, urgency, frequency and flank pain.  Musculoskeletal: Negative for myalgias.       Past Medical History  Diagnosis Date  . Hypertension   . Hyperlipidemia   . Hypothyroidism   . GERD (gastroesophageal reflux disease)   . IBS (irritable bowel syndrome)   . Colonic polyp     5 mm adenoma 1977  . NASH (nonalcoholic steatohepatitis)   . Adenocarcinoma, breast   . Degenerative joint disease   . Fibromyalgia   . Osteopenia   . Anxiety   . History of shingles   . History of anemia   . Hemorrhoids     History   Social History  . Marital Status: Married    Spouse Name: [name]  . Number of Children: 2  . Years of Education: N/A   Occupational History  . retired    Social History Main Topics  . Smoking status: Former Smoker -- 0.20 packs/day for 7 years    Types: Cigarettes    Quit date: 09/29/1970  . Smokeless tobacco: Never Used     Comment: 1 pack per week  . Alcohol Use: Yes     Comment: socially  . Drug Use: No  . Sexual Activity: Yes   Other Topics Concern  . Not on file   Social History Narrative    Past Surgical History  Procedure Laterality Date  . Abdominal hysterectomy    . Bilateral salpingoophorectomy  1978  . Rotator cuff repair      left  .  Laparoscopic cholecystectomy    . Breast lumpectomy      left--node dissection 7/05 by Dr. Charlynne Pander  . Appendectomy      Family History  Problem Relation Age of Onset  . Prostate cancer Father   . Prostate cancer Brother   . Stroke Mother   . Aneurysm Brother   . Emphysema Sister   . Crohn's disease Sister   . Liver disease Brother   . Hypertension Brother     Allergies  Allergen Reactions  . Codeine     REACTION: nausea and vomiting    Current Outpatient Prescriptions on File Prior to Visit  Medication Sig Dispense Refill  . ALPRAZolam (XANAX) 0.5 MG tablet Take 1 tablet (0.5 mg total) by mouth 3 (three) times daily as needed for anxiety. 270 tablet 2  . amLODipine (NORVASC) 5 MG tablet Take 1 tablet (5 mg total) by mouth daily. 90 tablet 3  . aspirin (ADULT ASPIRIN EC LOW STRENGTH) 81 MG EC tablet Take 81 mg by mouth daily.      Marland Kitchen levothyroxine (LEVOTHROID) 75 MCG tablet Take 1 tablet (75 mcg total) by mouth daily. 90 tablet 3  . Multiple Vitamins-Calcium (ONE-A-DAY WOMENS FORMULA PO) Take 1 tablet by mouth daily.    Marland Kitchen omeprazole (PRILOSEC)  20 MG capsule Take 1 capsule (20 mg total) by mouth daily. 90 capsule 3   No current facility-administered medications on file prior to visit.    BP 146/70 mmHg  Pulse 80  Temp(Src) 98.6 F (37 C) (Oral)  Ht 5' 5.25" (1.657 m)  Wt 190 lb 1.9 oz (86.238 kg)  BMI 31.41 kg/m2  SpO2 96%    Objective:   Physical Exam  Constitutional: She is oriented to person, place, and time. She appears well-nourished. No distress.  Cardiovascular: Normal rate and regular rhythm.   Pulmonary/Chest: Effort normal and breath sounds normal.  Abdominal: Soft. There is no tenderness.  Neurological: She is alert and oriented to person, place, and time.  Skin: Skin is warm and dry.          Assessment & Plan:  Urinary tract infection:  UA positive for leuks, nitrites. Negative for blood, protein. RX for Cephalexin BID x 7 days. Pyridiium TID  PRN. Culture sent. Follow up if no improvement in symptoms in 3-4 days.

## 2015-02-21 NOTE — Addendum Note (Signed)
Addended by: Jacqualin Combes on: 02/21/2015 03:04 PM   Modules accepted: Orders

## 2015-02-22 ENCOUNTER — Other Ambulatory Visit: Payer: Self-pay | Admitting: Family Medicine

## 2015-02-24 LAB — URINE CULTURE: Colony Count: >=100000

## 2015-02-27 ENCOUNTER — Other Ambulatory Visit: Payer: Self-pay | Admitting: *Deleted

## 2015-02-27 ENCOUNTER — Other Ambulatory Visit: Payer: Self-pay | Admitting: Family Medicine

## 2015-02-27 NOTE — Telephone Encounter (Signed)
Last f/u appt 03/2014 

## 2015-02-27 NOTE — Telephone Encounter (Signed)
Rx called in to requested pharmacy 

## 2015-02-27 NOTE — Telephone Encounter (Signed)
Lm on pts vm and informed her Rx was correct for #30. Pt has not had labs since 01/2014, and will be required to have OV with labs for any additional refills

## 2015-02-27 NOTE — Telephone Encounter (Signed)
Pt walked in after picking up her medications from [facility] and stated that the Levothyroxine was supposed to be for 90 days.  Only received a 30 day refill.  Patient requests a call back at [phone] / lt

## 2015-02-28 ENCOUNTER — Telehealth: Payer: Self-pay | Admitting: Family Medicine

## 2015-02-28 NOTE — Telephone Encounter (Signed)
Pt has appt with Dr Deborra Medina 03/01/15 at 10:45 AM.

## 2015-02-28 NOTE — Telephone Encounter (Signed)
Patient Name: GIANNE SHUGARS DOB: 18-Aug-1941 Initial Comment Caller states she was in last week and saw Nurse Practioner for UTI. Finished ceflex but is still having urination frequency and wondering if she should come back in or get more medicine. Nurse Assessment Nurse: Vallery Sa, RN, Cathy Date/Time (Eastern Time): 02/28/2015 2:23:16 PM Confirm and document reason for call. If symptomatic, describe symptoms. ---Caller states she completed antibiotics for UTI 2 nights ago. She continues to have urinary frequency and pain with urination. No fever. Has the patient traveled out of the country within the last 30 days? ---No Does the patient require triage? ---Yes Related visit to physician within the last 2 weeks? ---Yes Does the PT have any chronic conditions? (i.e. diabetes, asthma, etc.) ---Yes List chronic conditions. ---Recent UTI Guidelines Guideline Title Affirmed Question Affirmed Notes Urinary Tract Infection on Antibiotic Follow-up Call - Female [1] Taking antibiotic > 72 hours (3 days) for UTI AND [2] painful urination or frequency not improved Final Disposition User See Physician within [place], RN, [place] Comments Scheduled appointment 03/01/15 at 10:45am with Dr. Deborra Medina.

## 2015-03-01 ENCOUNTER — Ambulatory Visit (INDEPENDENT_AMBULATORY_CARE_PROVIDER_SITE_OTHER): Payer: Medicare HMO | Admitting: Family Medicine

## 2015-03-01 ENCOUNTER — Encounter: Payer: Self-pay | Admitting: Family Medicine

## 2015-03-01 VITALS — BP 124/82 | HR 76 | Temp 98.0°F | Wt 187.2 lb

## 2015-03-01 DIAGNOSIS — N390 Urinary tract infection, site not specified: Secondary | ICD-10-CM

## 2015-03-01 DIAGNOSIS — R350 Frequency of micturition: Secondary | ICD-10-CM

## 2015-03-01 DIAGNOSIS — E038 Other specified hypothyroidism: Secondary | ICD-10-CM

## 2015-03-01 HISTORY — DX: Urinary tract infection, site not specified: N39.0

## 2015-03-01 LAB — POCT URINALYSIS DIPSTICK
BILIRUBIN UA: NEGATIVE
Glucose, UA: NEGATIVE
KETONES UA: NEGATIVE
Leukocytes, UA: NEGATIVE
NITRITE UA: NEGATIVE
Protein, UA: NEGATIVE
RBC UA: NEGATIVE
SPEC GRAV UA: 1.015
Urobilinogen, UA: 0.2
pH, UA: 6

## 2015-03-01 NOTE — Progress Notes (Signed)
Pre visit review using our clinic review tool, if applicable. No additional management support is needed unless otherwise documented below in the visit note. 

## 2015-03-01 NOTE — Assessment & Plan Note (Signed)
  Reassurance provided. Urine cx sensitive to Keflex, UA neg today and symptoms resolved. No further rx. Follow up prn. The patient indicates understanding of these issues and agrees with the plan.

## 2015-03-01 NOTE — Progress Notes (Signed)
Subjective:   Patient ID: [name], female    DOB: 1940/10/06, 74 y.o.   MRN: [id number]  [name] is a pleasant 74 y.o. year old female who presents to clinic today with Urinary Frequency  on 03/01/2015  HPI:  Saw Kaitlyn Shark, NP, last week for UTI symptoms.  Note reviewed.  UA pos for LE and nitrites. Given 7 day course of Keflex 500 mg twice daily.  Urine cx grew pan sensitive e coli, >100,000.  Finished keflex yesterday but still having symptoms until last night.  Was having increased frequency and pressure but this morning those symptoms have resolved.   Current Outpatient Prescriptions on File Prior to Visit  Medication Sig Dispense Refill  . ALPRAZolam (XANAX) 0.5 MG tablet TAKE 1 TABLET BY MOUTH 3 TIMES DAILY AS NEEDED FOR ANXIETY 90 tablet 0  . amLODipine (NORVASC) 5 MG tablet TAKE 1 TABLET BY MOUTH DAILY 90 tablet 1  . aspirin (ADULT ASPIRIN EC LOW STRENGTH) 81 MG EC tablet Take 81 mg by mouth daily.      Marland Kitchen levothyroxine (SYNTHROID, LEVOTHROID) 75 MCG tablet TAKE 1 TABLET BY MOUTH DAILY 30 tablet 0  . Multiple Vitamins-Calcium (ONE-A-DAY WOMENS FORMULA PO) Take 1 tablet by mouth daily.    Marland Kitchen omeprazole (PRILOSEC) 20 MG capsule TAKE ONE (1) CAPSULE BY MOUTH EACH DAY 90 capsule 3   No current facility-administered medications on file prior to visit.    Allergies  Allergen Reactions  . Codeine     REACTION: nausea and vomiting    Past Medical History  Diagnosis Date  . Hypertension   . Hyperlipidemia   . Hypothyroidism   . GERD (gastroesophageal reflux disease)   . IBS (irritable bowel syndrome)   . Colonic polyp     5 mm adenoma 1977  . NASH (nonalcoholic steatohepatitis)   . Adenocarcinoma, breast   . Degenerative joint disease   . Fibromyalgia   . Osteopenia   . Anxiety   . History of shingles   . History of anemia   . Hemorrhoids     Past Surgical History  Procedure Laterality Date  . Abdominal hysterectomy    . Bilateral  salpingoophorectomy  1978  . Rotator cuff repair      left  . Laparoscopic cholecystectomy    . Breast lumpectomy      left--node dissection 7/05 by Dr. Charlynne Pander  . Appendectomy      Family History  Problem Relation Age of Onset  . Prostate cancer Father   . Prostate cancer Brother   . Stroke Mother   . Aneurysm Brother   . Emphysema Sister   . Crohn's disease Sister   . Liver disease Brother   . Hypertension Brother     History   Social History  . Marital Status: Married    Spouse Name: [name]  . Number of Children: 2  . Years of Education: N/A   Occupational History  . retired    Social History Main Topics  . Smoking status: Former Smoker -- 0.20 packs/day for 7 years    Types: Cigarettes    Quit date: 09/29/1970  . Smokeless tobacco: Never Used     Comment: 1 pack per week  . Alcohol Use: Yes     Comment: socially  . Drug Use: No  . Sexual Activity: Yes   Other Topics Concern  . Not on file   Social History Narrative   The PMH, [place], Social History, Family  History, Medications, and allergies have been reviewed in [facility], and have been updated if relevant.  Review of Systems  Constitutional: Negative.   Gastrointestinal: Negative.   Genitourinary: Negative.   Musculoskeletal: Negative.   Skin: Negative.   Neurological: Negative.   Hematological: Negative.   Psychiatric/Behavioral: Negative.   All other systems reviewed and are negative.      Objective:    BP 124/82 mmHg  Pulse 76  Temp(Src) 98 F (36.7 C) (Oral)  Wt 187 lb 4 oz (84.936 kg)  SpO2 97%   Physical Exam  Constitutional: She is oriented to person, place, and time. She appears well-developed and well-nourished. No distress.  HENT:  Head: Normocephalic.  Eyes: Conjunctivae are normal.  Cardiovascular: Normal rate.   Pulmonary/Chest: Effort normal.  Abdominal: Soft. Bowel sounds are normal. She exhibits no distension. There is no tenderness. There is no rebound and no guarding.    Musculoskeletal: Normal range of motion.  Neurological: She is alert and oriented to person, place, and time. No cranial nerve deficit.  Skin: Skin is warm and dry.  Psychiatric: She has a normal mood and affect. Her behavior is normal. Judgment and thought content normal.  Nursing note and vitals reviewed.         Assessment & Plan:   Urinary frequency - Plan: Urinalysis Dipstick  Complicated UTI (urinary tract infection) No Follow-up on file.

## 2015-03-28 ENCOUNTER — Ambulatory Visit (INDEPENDENT_AMBULATORY_CARE_PROVIDER_SITE_OTHER): Payer: Medicare HMO | Admitting: Family Medicine

## 2015-03-28 ENCOUNTER — Encounter: Payer: Self-pay | Admitting: Family Medicine

## 2015-03-28 ENCOUNTER — Other Ambulatory Visit: Payer: Self-pay | Admitting: Family Medicine

## 2015-03-28 ENCOUNTER — Encounter: Payer: Self-pay | Admitting: *Deleted

## 2015-03-28 VITALS — BP 110/62 | HR 75 | Temp 97.7°F | Wt 187.5 lb

## 2015-03-28 DIAGNOSIS — E038 Other specified hypothyroidism: Secondary | ICD-10-CM

## 2015-03-28 DIAGNOSIS — C50919 Malignant neoplasm of unspecified site of unspecified female breast: Secondary | ICD-10-CM

## 2015-03-28 DIAGNOSIS — I10 Essential (primary) hypertension: Secondary | ICD-10-CM | POA: Diagnosis not present

## 2015-03-28 DIAGNOSIS — K760 Fatty (change of) liver, not elsewhere classified: Secondary | ICD-10-CM

## 2015-03-28 DIAGNOSIS — E785 Hyperlipidemia, unspecified: Secondary | ICD-10-CM

## 2015-03-28 LAB — LIPID PANEL
CHOLESTEROL: 209 mg/dL — AB (ref 0–200)
HDL: 44.7 mg/dL (ref >39.00)
LDL Cholesterol: 133 mg/dL — ABNORMAL HIGH (ref 0–99)
NonHDL: 164.3
Total CHOL/HDL Ratio: 5
Triglycerides: 155 mg/dL — ABNORMAL HIGH (ref 0.0–149.0)
VLDL: 31 mg/dL (ref 0.0–40.0)

## 2015-03-28 LAB — COMPREHENSIVE METABOLIC PANEL
ALK PHOS: 91 U/L (ref 39–117)
ALT: 88 U/L — AB (ref 0–35)
AST: 86 U/L — ABNORMAL HIGH (ref 0–37)
Albumin: 4.2 g/dL (ref 3.5–5.2)
BUN: 20 mg/dL (ref 6–23)
CALCIUM: 9.7 mg/dL (ref 8.4–10.5)
CO2: 23 mEq/L (ref 19–32)
Chloride: 103 mEq/L (ref 96–112)
Creatinine, Ser: 0.89 mg/dL (ref 0.40–1.20)
GFR: 65.69 mL/min (ref >60.00)
GLUCOSE: 100 mg/dL — AB (ref 70–99)
Potassium: 4.1 mEq/L (ref 3.5–5.1)
SODIUM: 135 meq/L (ref 135–145)
Total Bilirubin: 0.7 mg/dL (ref 0.2–1.2)
Total Protein: 8 g/dL (ref 6.0–8.3)

## 2015-03-28 LAB — TSH: TSH: 3.42 u[IU]/mL (ref 0.35–4.50)

## 2015-03-28 LAB — T4, FREE: Free T4: 1.29 ng/dL (ref 0.60–1.60)

## 2015-03-28 NOTE — Assessment & Plan Note (Signed)
Symptomatic. Recheck LFTS.  Proven on liver biopsy in 2008.

## 2015-03-28 NOTE — Progress Notes (Addendum)
Subjective:   Patient ID: [name], female    DOB: December 16, 1940, 74 y.o.   MRN: [id number]  [name] is a pleasant 74 y.o. year old female who presents to clinic today with Follow-up  on 03/28/2015  HPI:   HTN- on Norvasc 5 mg daily. Denies CP, SOB or LE edema. Lab Results  Component Value Date   CREATININE 0.8 02/09/2014    Hypothyroidism- has been on synthroid 75 mcg daily. Denies any symptoms of hypo or hyperthyroidism. Lab Results  Component Value Date   TSH 1.76 02/09/2014     HLD- has refused statins.   Lab Results  Component Value Date   CHOL 221* 02/09/2014   HDL 46.30 02/09/2014   LDLCALC 134* 02/09/2014   LDLDIRECT 166.9 01/20/2013   TRIG 203.0* 02/09/2014   CHOLHDL 5 02/09/2014   H/o elevated LFTs- prob hepatic steatosis.   Lab Results  Component Value Date   ALT 79* 02/09/2014   AST 71* 02/09/2014   ALKPHOS 81 02/09/2014   BILITOT 0.9 02/09/2014   H/o breast CA- diagnosed in 2005.  S/p lumpectomy and node vx. S/p chemo and 5 years of tamoxifen.    Patient Active Problem List   Diagnosis Date Noted  . Complicated UTI (urinary tract infection) 03/01/2015  . Need for diphtheria-tetanus-pertussis (Tdap) vaccine 01/15/2011  . Hepatic steatosis 11/27/2009  . COLONIC POLYPS, ADENOMATOUS, HX OF 11/27/2009  . SHINGLES 12/10/2008  . Malignant neoplasm of female breast 10/21/2007  . Hypothyroidism 10/21/2007  . ANEMIA 10/21/2007  . DEGENERATIVE JOINT DISEASE 10/21/2007  . OSTEOPENIA 10/21/2007  . HLD (hyperlipidemia) 09/17/2007  . ANXIETY 09/17/2007  . Essential hypertension 09/17/2007  . GERD 09/17/2007  . IRRITABLE BOWEL SYNDROME 09/17/2007  . FIBROMYALGIA 09/17/2007   Past Medical History  Diagnosis Date  . Hypertension   . Hyperlipidemia   . Hypothyroidism   . GERD (gastroesophageal reflux disease)   . IBS (irritable bowel syndrome)   . Colonic polyp     5 mm adenoma 1977  . NASH (nonalcoholic steatohepatitis)   .  Adenocarcinoma, breast   . Degenerative joint disease   . Fibromyalgia   . Osteopenia   . Anxiety   . History of shingles   . History of anemia   . Hemorrhoids    Past Surgical History  Procedure Laterality Date  . Abdominal hysterectomy    . Bilateral salpingoophorectomy  1978  . Rotator cuff repair      left  . Laparoscopic cholecystectomy    . Breast lumpectomy      left--node dissection 7/05 by Dr. Charlynne Pander  . Appendectomy     History  Substance Use Topics  . Smoking status: Former Smoker -- 0.20 packs/day for 7 years    Types: Cigarettes    Quit date: 09/29/1970  . Smokeless tobacco: Never Used     Comment: 1 pack per week  . Alcohol Use: Yes     Comment: socially   Family History  Problem Relation Age of Onset  . Prostate cancer Father   . Prostate cancer Brother   . Stroke Mother   . Aneurysm Brother   . Emphysema Sister   . Crohn's disease Sister   . Liver disease Brother   . Hypertension Brother    Allergies  Allergen Reactions  . Codeine     REACTION: nausea and vomiting   Current Outpatient Prescriptions on File Prior to Visit  Medication Sig Dispense Refill  . ALPRAZolam (XANAX) 0.5 MG  tablet TAKE 1 TABLET BY MOUTH 3 TIMES DAILY AS NEEDED FOR ANXIETY 90 tablet 0  . amLODipine (NORVASC) 5 MG tablet TAKE 1 TABLET BY MOUTH DAILY 90 tablet 1  . aspirin (ADULT ASPIRIN EC LOW STRENGTH) 81 MG EC tablet Take 81 mg by mouth daily.      Marland Kitchen levothyroxine (SYNTHROID, LEVOTHROID) 75 MCG tablet TAKE 1 TABLET BY MOUTH DAILY 30 tablet 0  . Multiple Vitamins-Calcium (ONE-A-DAY WOMENS FORMULA PO) Take 1 tablet by mouth daily.    Marland Kitchen omeprazole (PRILOSEC) 20 MG capsule TAKE ONE (1) CAPSULE BY MOUTH EACH DAY 90 capsule 3   No current facility-administered medications on file prior to visit.   The PMH, PSH, Social History, Family History, Medications, and allergies have been reviewed in [facility], and have been updated if relevant.   Review of Systems  Constitutional:  Negative.   HENT: Negative.   Eyes: Negative.   Respiratory: Negative.   Cardiovascular: Negative.   Gastrointestinal: Negative.   Endocrine: Negative.   Genitourinary: Negative.   Musculoskeletal: Negative.   Skin: Negative.   Allergic/Immunologic: Negative.   Neurological: Negative.   Hematological: Negative.   Psychiatric/Behavioral: Negative.   All other systems reviewed and are negative.     Objective:    BP 110/62 mmHg  Pulse 75  Temp(Src) 97.7 F (36.5 C) (Oral)  Wt 187 lb 8 oz (85.049 kg)  SpO2 97%   Physical Exam  Constitutional: She is oriented to person, place, and time. She appears well-developed and well-nourished. No distress.  HENT:  Head: Normocephalic and atraumatic.  Neck: Normal range of motion.  Cardiovascular: Normal rate and regular rhythm.   Pulmonary/Chest: Effort normal and breath sounds normal.  Abdominal: Soft. Bowel sounds are normal. She exhibits no distension.  Musculoskeletal: Normal range of motion. She exhibits no edema.  Neurological: She is alert and oriented to person, place, and time. No cranial nerve deficit.  Skin: Skin is warm and dry.  Psychiatric: She has a normal mood and affect. Her behavior is normal. Judgment and thought content normal.  Nursing note and vitals reviewed.           Assessment & Plan:   HLD (hyperlipidemia) - Plan: Comprehensive metabolic panel, Lipid panel  Essential hypertension  Malignant neoplasm of female breast, unspecified laterality  Other specified hypothyroidism - Plan: TSH, T4, Free  Hepatic steatosis No Follow-up on file.

## 2015-03-28 NOTE — Assessment & Plan Note (Signed)
Continue current dose of synthroid. Recheck labs today.  Orders Placed This Encounter  Procedures  . TSH  . T4, Free  . Comprehensive metabolic panel  . Lipid panel

## 2015-03-28 NOTE — Progress Notes (Signed)
Pre visit review using our clinic review tool, if applicable. No additional management support is needed unless otherwise documented below in the visit note. 

## 2015-03-28 NOTE — Assessment & Plan Note (Signed)
Well controlled. No changes made to rxs. 

## 2015-03-28 NOTE — Assessment & Plan Note (Signed)
Recheck lipid panel today. Has refused rxs.

## 2015-03-30 ENCOUNTER — Other Ambulatory Visit: Payer: Self-pay | Admitting: Family Medicine

## 2015-06-11 ENCOUNTER — Other Ambulatory Visit: Payer: Self-pay

## 2015-06-11 ENCOUNTER — Encounter: Payer: Self-pay | Admitting: Internal Medicine

## 2015-06-11 ENCOUNTER — Ambulatory Visit (INDEPENDENT_AMBULATORY_CARE_PROVIDER_SITE_OTHER): Payer: Medicare HMO | Admitting: Internal Medicine

## 2015-06-11 VITALS — BP 130/70 | HR 85 | Temp 97.8°F | Wt 189.0 lb

## 2015-06-11 DIAGNOSIS — N390 Urinary tract infection, site not specified: Secondary | ICD-10-CM | POA: Diagnosis not present

## 2015-06-11 DIAGNOSIS — Z1231 Encounter for screening mammogram for malignant neoplasm of breast: Secondary | ICD-10-CM

## 2015-06-11 DIAGNOSIS — N3289 Other specified disorders of bladder: Secondary | ICD-10-CM | POA: Diagnosis not present

## 2015-06-11 DIAGNOSIS — R300 Dysuria: Secondary | ICD-10-CM | POA: Diagnosis not present

## 2015-06-11 DIAGNOSIS — R3989 Other symptoms and signs involving the genitourinary system: Secondary | ICD-10-CM

## 2015-06-11 DIAGNOSIS — IMO0001 Reserved for inherently not codable concepts without codable children: Secondary | ICD-10-CM

## 2015-06-11 DIAGNOSIS — R350 Frequency of micturition: Secondary | ICD-10-CM

## 2015-06-11 DIAGNOSIS — Z853 Personal history of malignant neoplasm of breast: Secondary | ICD-10-CM

## 2015-06-11 LAB — POCT URINALYSIS DIPSTICK
Bilirubin, UA: NEGATIVE
GLUCOSE UA: NEGATIVE
Ketones, UA: NEGATIVE
Nitrite, UA: NEGATIVE
PH UA: 6
PROTEIN UA: NEGATIVE
RBC UA: NEGATIVE
SPEC GRAV UA: 1.02
UROBILINOGEN UA: NEGATIVE

## 2015-06-11 MED ORDER — CIPROFLOXACIN HCL 250 MG PO TABS: 250.0000 mg | ORAL_TABLET | Freq: Two times a day (BID) | ORAL | Status: DC

## 2015-06-11 NOTE — Patient Instructions (Signed)

## 2015-06-11 NOTE — Progress Notes (Signed)
HPI  Pt presents to the clinic today with c/o urinary frequency, bladder pressure, mild dysuria and low back pain. This started 1 week ago. She denies fever, chills or nausea. She has had UTI's in the past and reports this feels the same (last one 02/2015 treated with Keflex). She has been pushing fluids and tried cranberry juice with minimal relief. She denies vaginal complaints.   Review of Systems  Past Medical History  Diagnosis Date  . Hypertension   . Hyperlipidemia   . Hypothyroidism   . GERD (gastroesophageal reflux disease)   . IBS (irritable bowel syndrome)   . Colonic polyp     5 mm adenoma 1977  . NASH (nonalcoholic steatohepatitis)   . Adenocarcinoma, breast   . Degenerative joint disease   . Fibromyalgia   . Osteopenia   . Anxiety   . History of shingles   . History of anemia   . Hemorrhoids     Family History  Problem Relation Age of Onset  . Prostate cancer Father   . Prostate cancer Brother   . Stroke Mother   . Aneurysm Brother   . Emphysema Sister   . Crohn's disease Sister   . Liver disease Brother   . Hypertension Brother     Social History   Social History  . Marital Status: Married    Spouse Name: [name]  . Number of Children: 2  . Years of Education: N/A   Occupational History  . retired    Social History Main Topics  . Smoking status: Former Smoker -- 0.20 packs/day for 7 years    Types: Cigarettes    Quit date: 09/29/1970  . Smokeless tobacco: Never Used     Comment: 1 pack per week  . Alcohol Use: Yes     Comment: socially  . Drug Use: No  . Sexual Activity: Yes   Other Topics Concern  . Not on file   Social History Narrative    Allergies  Allergen Reactions  . Codeine     REACTION: nausea and vomiting    Constitutional: Denies fever, malaise, fatigue, headache or abrupt weight changes.   GU: Pt reports urgency, frequency and pain with urination. Denies burning sensation, blood in urine, odor or discharge. Skin:  Denies redness, rashes, lesions or ulcercations.   No other specific complaints in a complete review of systems (except as listed in HPI above).    Objective:   Physical Exam  Pulse 85  Temp(Src) 97.8 F (36.6 C) (Oral)  Wt 189 lb (85.73 kg)  SpO2 98% Wt Readings from Last 3 Encounters:  06/11/15 189 lb (85.73 kg)  03/28/15 187 lb 8 oz (85.049 kg)  03/01/15 187 lb 4 oz (84.936 kg)    General: Appears her stated age, well developed, well nourished in NAD. Cardiovascular: Normal rate and rhythm. S1,S2 noted.   Pulmonary/Chest: Normal effort and positive vesicular breath sounds. No respiratory distress. No wheezes, rales or ronchi noted.  Abdomen: Soft. Normal bowel sounds, no bruits noted. No distention or masses noted. Tender to palpation over the bladder area. No CVA tenderness.      Assessment & Plan:   Urgency, Frequency, Dysuria secondary to UTI  Urinalysis: 3+ leuks Will send urine culture eRx sent if for Cipro 250 mg BID x 5 days OK to take AZO OTC Drink plenty of fluids  RTC as needed or if symptoms persist.

## 2015-06-11 NOTE — Progress Notes (Signed)
Pre visit review using our clinic review tool, if applicable. No additional management support is needed unless otherwise documented below in the visit note. 

## 2015-06-11 NOTE — Addendum Note (Signed)
Addended by: Lurlean Nanny on: 06/11/2015 03:44 PM   Modules accepted: Orders

## 2015-06-12 ENCOUNTER — Telehealth: Payer: Self-pay

## 2015-06-12 ENCOUNTER — Other Ambulatory Visit: Payer: Self-pay | Admitting: Family Medicine

## 2015-06-12 NOTE — Telephone Encounter (Signed)
Last f/u appt 02/2015 

## 2015-06-12 NOTE — Telephone Encounter (Signed)
Rx called in to requested pharmacy 

## 2015-06-12 NOTE — Telephone Encounter (Signed)
Pt was seen 06/11/15; pt has taken 3 Cipro since started med; pt having diarrhea and nausea, pt also feels jittery. No problem breathing. pt request different abx to [place]. Pt has taken Keflex before that worked well. Pt is not going to take any more of Cipro. Pt request cb on 06/13/15.

## 2015-06-13 ENCOUNTER — Other Ambulatory Visit: Payer: Self-pay | Admitting: Internal Medicine

## 2015-06-13 MED ORDER — CEPHALEXIN 250 MG PO CAPS: 250.0000 mg | ORAL_CAPSULE | Freq: Three times a day (TID) | ORAL | Status: DC

## 2015-06-13 NOTE — Telephone Encounter (Signed)
Keflex sent to pharmacy

## 2015-06-13 NOTE — Telephone Encounter (Signed)
Left message on voicemail.

## 2015-06-14 ENCOUNTER — Ambulatory Visit
Admission: RE | Admit: 2015-06-14 | Discharge: 2015-06-14 | Disposition: A | Discharge status: Home / Self Care | Payer: Medicare HMO | Source: Ambulatory Visit

## 2015-06-14 DIAGNOSIS — Z853 Personal history of malignant neoplasm of breast: Secondary | ICD-10-CM

## 2015-06-14 DIAGNOSIS — Z1231 Encounter for screening mammogram for malignant neoplasm of breast: Secondary | ICD-10-CM

## 2015-06-14 LAB — URINE CULTURE: Colony Count: 80000

## 2015-07-03 ENCOUNTER — Ambulatory Visit (INDEPENDENT_AMBULATORY_CARE_PROVIDER_SITE_OTHER): Payer: Medicare HMO | Admitting: Family Medicine

## 2015-07-03 ENCOUNTER — Ambulatory Visit (INDEPENDENT_AMBULATORY_CARE_PROVIDER_SITE_OTHER)
Admission: RE | Admit: 2015-07-03 | Discharge: 2015-07-03 | Disposition: A | Discharge status: Home / Self Care | Payer: Medicare HMO | Source: Ambulatory Visit | Attending: Family Medicine | Admitting: Family Medicine

## 2015-07-03 ENCOUNTER — Other Ambulatory Visit: Payer: Self-pay | Admitting: Family Medicine

## 2015-07-03 VITALS — BP 120/70 | HR 83 | Temp 98.3°F | Wt 184.2 lb

## 2015-07-03 DIAGNOSIS — M545 Low back pain, unspecified: Secondary | ICD-10-CM

## 2015-07-03 DIAGNOSIS — M5440 Lumbago with sciatica, unspecified side: Secondary | ICD-10-CM

## 2015-07-03 DIAGNOSIS — G8929 Other chronic pain: Secondary | ICD-10-CM | POA: Insufficient documentation

## 2015-07-03 DIAGNOSIS — M549 Dorsalgia, unspecified: Secondary | ICD-10-CM

## 2015-07-03 NOTE — Progress Notes (Signed)
SUBJECTIVE:  [name] is a 74 y.o. female who complains of low back pain for 6 week(s), positional with bending or lifting, with radiation down the legs. Precipitating factors: none recalled by the patient. Prior history of back problems: recurrent self limited episodes of low back pain in the past. There is no numbness in the legs but she feels that she does have more urgency to have BMs since pain has worsened. Alleve does dull the pain.   Current Outpatient Prescriptions on File Prior to Visit  Medication Sig Dispense Refill  . ALPRAZolam (XANAX) 0.5 MG tablet TAKE 1 TABLET BY MOUTH THREE TIMES A DAY(MUST MAKE APPT FOR FURTHER REFILLS) 90 tablet 0  . amLODipine (NORVASC) 5 MG tablet TAKE 1 TABLET BY MOUTH DAILY 90 tablet 1  . aspirin (ADULT ASPIRIN EC LOW STRENGTH) 81 MG EC tablet Take 81 mg by mouth daily.      Marland Kitchen levothyroxine (SYNTHROID, LEVOTHROID) 75 MCG tablet TAKE 1 TABLET BY MOUTH DAILY 30 tablet 11  . Multiple Vitamins-Calcium (ONE-A-DAY WOMENS FORMULA PO) Take 1 tablet by mouth daily.    Marland Kitchen omeprazole (PRILOSEC) 20 MG capsule TAKE ONE (1) CAPSULE BY MOUTH EACH DAY 90 capsule 3   No current facility-administered medications on file prior to visit.    Allergies  Allergen Reactions  . Ciprofloxacin Diarrhea  . Codeine     REACTION: nausea and vomiting    Past Medical History  Diagnosis Date  . Hypertension   . Hyperlipidemia   . Hypothyroidism   . GERD (gastroesophageal reflux disease)   . IBS (irritable bowel syndrome)   . Colonic polyp     5 mm adenoma 1977  . NASH (nonalcoholic steatohepatitis)   . Adenocarcinoma, breast   . Degenerative joint disease   . Fibromyalgia   . Osteopenia   . Anxiety   . History of shingles   . History of anemia   . Hemorrhoids     Past Surgical History  Procedure Laterality Date  . Abdominal hysterectomy    . Bilateral salpingoophorectomy  1978  . Rotator cuff repair      left  . Laparoscopic cholecystectomy    .  Breast lumpectomy      left--node dissection 7/05 by Dr. Charlynne Pander  . Appendectomy      Family History  Problem Relation Age of Onset  . Prostate cancer Father   . Prostate cancer Brother   . Stroke Mother   . Aneurysm Brother   . Emphysema Sister   . Crohn's disease Sister   . Liver disease Brother   . Hypertension Brother     Social History   Social History  . Marital Status: Married    Spouse Name: [name]  . Number of Children: 2  . Years of Education: N/A   Occupational History  . retired    Social History Main Topics  . Smoking status: Former Smoker -- 0.20 packs/day for 7 years    Types: Cigarettes    Quit date: 09/29/1970  . Smokeless tobacco: Never Used     Comment: 1 pack per week  . Alcohol Use: Yes     Comment: socially  . Drug Use: No  . Sexual Activity: Yes   Other Topics Concern  . Not on file   Social History Narrative   The PMH, PSH, Social History, Family History, Medications, and allergies have been reviewed in [facility], and have been updated if relevant.  OBJECTIVE: BP 120/70 mmHg  Pulse 83  Temp(Src) 98.3 F (36.8 C) (Oral)  Wt 184 lb 4 oz (83.575 kg)  SpO2 96%   Patient appears to be in mild to moderate pain, antalgic gait noted. Lumbosacral spine area reveals no local tenderness or mass.  Painful and reduced LS ROM noted. Straight leg raise is positive at 45 degrees on left. DTR's, motor strength and sensation normal, including heel and toe gait.  Peripheral pulses are palpable. X-Ray: ordered, but results not yet available.  ASSESSMENT:  herniated disc likely at L5-S1  PLAN: Xray today, likely proceed with MRI given ? Bowel involvement.  For acute pain, rest, intermittent application of heat (do not sleep on heating pad), analgesics and muscle relaxants are recommended. Discussed longer term treatment plan of prn NSAID's and discussed a home back care exercise program with flexion exercise routine. Proper lifting with avoidance of heavy  lifting discussed.    The patient indicates understanding of these issues and agrees with the plan.

## 2015-07-03 NOTE — Progress Notes (Signed)
Pre visit review using our clinic review tool, if applicable. No additional management support is needed unless otherwise documented below in the visit note. 

## 2015-07-12 ENCOUNTER — Telehealth: Payer: Self-pay | Admitting: Family Medicine

## 2015-07-12 NOTE — Telephone Encounter (Signed)
Pt dropped off MRI cd. Pt requests to get cd back when Dr. Deborra Medina is finished. The best number to reach pt is [phone]. Placing cd on cart to be brought to Dr. Deborra Medina.

## 2015-07-13 ENCOUNTER — Encounter: Payer: Self-pay | Admitting: Family Medicine

## 2015-07-25 ENCOUNTER — Telehealth: Payer: Self-pay | Admitting: Family Medicine

## 2015-07-25 NOTE — Telephone Encounter (Signed)
Pt dropped off a cd disk a week ago for Dr Deborra Medina to look at. She is wanting to pick it back up. Please call [phone] when the disc is ready to be picked up.  Thank you

## 2015-07-26 NOTE — Telephone Encounter (Signed)
Spoke to pt and advised per Dr Deborra Medina. Disk placed at the front desk for pickup

## 2015-07-26 NOTE — Telephone Encounter (Signed)
Attempted to view.  Incompatible with our software unfortunately.   On my desk to be picked up.

## 2015-07-26 NOTE — Telephone Encounter (Signed)
Pt came in to pick up her CD of her MRI.  She would like a call back regarding the results of her MRI of lumbar spine without

## 2015-07-26 NOTE — Telephone Encounter (Signed)
Contrast.  Please call the patient at [phone]

## 2015-07-26 NOTE — Telephone Encounter (Signed)
She has mild degenerative disc disease in her spine.  How is she feeling?

## 2015-07-27 NOTE — Telephone Encounter (Signed)
Spoke to pt and advised. Pt states pain is "not bothering [her] real bad"

## 2015-07-27 NOTE — Telephone Encounter (Signed)
Lm on pts vm requesting a call back 

## 2015-08-16 ENCOUNTER — Encounter: Payer: Self-pay | Admitting: Family Medicine

## 2015-08-16 ENCOUNTER — Ambulatory Visit (INDEPENDENT_AMBULATORY_CARE_PROVIDER_SITE_OTHER): Payer: Medicare HMO | Admitting: Family Medicine

## 2015-08-16 VITALS — BP 130/68 | HR 83 | Temp 97.8°F | Ht 65.25 in | Wt 185.2 lb

## 2015-08-16 DIAGNOSIS — R300 Dysuria: Secondary | ICD-10-CM | POA: Diagnosis not present

## 2015-08-16 LAB — POCT URINALYSIS DIP (MANUAL ENTRY)
Bilirubin, UA: NEGATIVE
Glucose, UA: NEGATIVE
Ketones, POC UA: NEGATIVE
NITRITE UA: NEGATIVE
PROTEIN UA: NEGATIVE
SPEC GRAV UA: 1.015
UROBILINOGEN UA: 0.2
pH, UA: 6

## 2015-08-16 LAB — POCT UA - MICROSCOPIC ONLY: RBC, URINE, MICROSCOPIC: 0

## 2015-08-16 NOTE — Progress Notes (Signed)
   Subjective:    Patient ID: [name], female    DOB: Feb 08, 1941, 74 y.o.   MRN: [id number]  Dysuria  This is a new problem. The current episode started 1 to 4 weeks ago. The problem occurs every urination. The problem has been gradually worsening. The quality of the pain is described as burning. The pain is at a severity of 3/10. There has been no fever. Associated symptoms include frequency and urgency. Pertinent negatives include no chills, discharge, flank pain, hematuria, nausea, possible pregnancy or vomiting. Associated symptoms comments: Urinary pressure. She has tried increased fluids for the symptoms. The treatment provided mild relief.    Social History /Family History/Past Medical History reviewed and updated if needed. History of frequent UTI in last year.. Last in 01/2015  Ecoli, pansensitive then 05/2015 Ecoli, [place].. Symptoms resolved.  Review of Systems  Constitutional: Negative for chills.  Gastrointestinal: Negative for nausea and vomiting.  Genitourinary: Positive for dysuria, urgency and frequency. Negative for hematuria and flank pain.       Objective:   Physical Exam  Constitutional: Vital signs are normal. She appears well-developed and well-nourished. She is cooperative.  Non-toxic appearance. She does not appear ill. No distress.  HENT:  Head: Normocephalic.  Right Ear: Hearing, tympanic membrane, external ear and ear canal normal. Tympanic membrane is not erythematous, not retracted and not bulging.  Left Ear: Hearing, tympanic membrane, external ear and ear canal normal. Tympanic membrane is not erythematous, not retracted and not bulging.  Nose: No mucosal edema or rhinorrhea. Right sinus exhibits no maxillary sinus tenderness and no frontal sinus tenderness. Left sinus exhibits no maxillary sinus tenderness and no frontal sinus tenderness.  Mouth/Throat: Uvula is midline, oropharynx is clear and moist and mucous membranes are normal.  Eyes:  Conjunctivae, EOM and lids are normal. Pupils are equal, round, and reactive to light. Lids are everted and swept, no foreign bodies found.  Neck: Trachea normal and normal range of motion. Neck supple. Carotid bruit is not present. No thyroid mass and no thyromegaly present.  Cardiovascular: Normal rate, regular rhythm, S1 normal, S2 normal, normal heart sounds, intact distal pulses and normal pulses.  Exam reveals no gallop and no friction rub.   No murmur heard. Pulmonary/Chest: Effort normal and breath sounds normal. No tachypnea. No respiratory distress. She has no decreased breath sounds. She has no wheezes. She has no rhonchi. She has no rales.  Abdominal: Soft. Normal appearance and bowel sounds are normal. There is no tenderness. There is no rigidity, no guarding and no CVA tenderness. No hernia.  Neurological: She is alert.  Skin: Skin is warm, dry and intact. No rash noted.  Psychiatric: Her speech is normal and behavior is normal. Judgment and thought content normal. Her mood appears not anxious. Cognition and memory are normal. She does not exhibit a depressed mood.          Assessment & Plan:

## 2015-08-16 NOTE — Patient Instructions (Signed)
We will call with culture results next week. Push water.  Avoid bladder irritants.. Caffeine, alcohol, tomato, citris, spicy foods.

## 2015-08-16 NOTE — Assessment & Plan Note (Signed)
No clear UTI. Send culture. Push fluids, avoid bladder irritants.  if not improving refer to uro for IC evaluation or consider topical estrogen.

## 2015-08-16 NOTE — Progress Notes (Signed)
Pre visit review using our clinic review tool, if applicable. No additional management support is needed unless otherwise documented below in the visit note. 

## 2015-08-18 LAB — URINE CULTURE: Colony Count: 25000

## 2015-08-21 ENCOUNTER — Telehealth: Payer: Self-pay | Admitting: Family Medicine

## 2015-08-21 NOTE — Telephone Encounter (Signed)
See Result Note.

## 2015-08-21 NOTE — Telephone Encounter (Signed)
Patient returned Donna's call. °

## 2015-08-27 ENCOUNTER — Other Ambulatory Visit: Payer: Self-pay | Admitting: Family Medicine

## 2015-10-19 ENCOUNTER — Ambulatory Visit (INDEPENDENT_AMBULATORY_CARE_PROVIDER_SITE_OTHER): Payer: Medicare HMO | Admitting: Internal Medicine

## 2015-10-19 ENCOUNTER — Encounter: Payer: Self-pay | Admitting: Internal Medicine

## 2015-10-19 VITALS — BP 132/86 | HR 83 | Temp 98.0°F | Wt 183.0 lb

## 2015-10-19 DIAGNOSIS — N3000 Acute cystitis without hematuria: Secondary | ICD-10-CM

## 2015-10-19 DIAGNOSIS — R300 Dysuria: Secondary | ICD-10-CM | POA: Diagnosis not present

## 2015-10-19 DIAGNOSIS — R350 Frequency of micturition: Secondary | ICD-10-CM | POA: Diagnosis not present

## 2015-10-19 LAB — POCT URINALYSIS DIPSTICK
Bilirubin, UA: NEGATIVE
GLUCOSE UA: NEGATIVE
KETONES UA: NEGATIVE
Nitrite, UA: NEGATIVE
Protein, UA: NEGATIVE
Urobilinogen, UA: 0.2
pH, UA: 6

## 2015-10-19 MED ORDER — AMOXICILLIN 500 MG PO TABS: 1000.0000 mg | ORAL_TABLET | Freq: Two times a day (BID) | ORAL | Status: DC

## 2015-10-19 NOTE — Assessment & Plan Note (Signed)
Classic symptoms Urinalysis shows sig leukocytes this time Will treat for 3 days Pan sensitive E coli on culture in November--will use amoxil since not sure she tolerates sulfa

## 2015-10-19 NOTE — Patient Instructions (Signed)
Please take the 2 doses today. If your symptoms are gone by tomorrow, you can stop after 3 days.

## 2015-10-19 NOTE — Progress Notes (Signed)
Subjective:    Patient ID: [name], female    DOB: 1940-10-23, 75 y.o.   MRN: [id number]  HPI Here due to urinary symptoms  Noticed dysuria at 4AM-- awoke feeling discomfort Urgency since then Drinking lots of fluids since then--seems some better No fever No abd pain No new back pain No hematuria  Current Outpatient Prescriptions on File Prior to Visit  Medication Sig Dispense Refill  . ALPRAZolam (XANAX) 0.5 MG tablet TAKE 1 TABLET BY MOUTH THREE TIMES A DAY(MUST MAKE APPT FOR FURTHER REFILLS) 90 tablet 0  . amLODipine (NORVASC) 5 MG tablet TAKE 1 TABLET BY MOUTH DAILY 90 tablet 1  . aspirin (ADULT ASPIRIN EC LOW STRENGTH) 81 MG EC tablet Take 81 mg by mouth daily.      Marland Kitchen levothyroxine (SYNTHROID, LEVOTHROID) 75 MCG tablet TAKE 1 TABLET BY MOUTH DAILY 30 tablet 11  . Multiple Vitamins-Calcium (ONE-A-DAY WOMENS FORMULA PO) Take 1 tablet by mouth daily.    Marland Kitchen omeprazole (PRILOSEC) 20 MG capsule TAKE ONE (1) CAPSULE BY MOUTH EACH DAY 90 capsule 3   No current facility-administered medications on file prior to visit.    Allergies  Allergen Reactions  . Ciprofloxacin Diarrhea  . Codeine     REACTION: nausea and vomiting    Past Medical History  Diagnosis Date  . Hypertension   . Hyperlipidemia   . Hypothyroidism   . GERD (gastroesophageal reflux disease)   . IBS (irritable bowel syndrome)   . Colonic polyp     5 mm adenoma 1977  . NASH (nonalcoholic steatohepatitis)   . Adenocarcinoma, breast ([place])   . Degenerative joint disease   . Fibromyalgia   . Osteopenia   . Anxiety   . History of shingles   . History of anemia   . Hemorrhoids     Past Surgical History  Procedure Laterality Date  . Abdominal hysterectomy    . Bilateral salpingoophorectomy  1978  . Rotator cuff repair      left  . Laparoscopic cholecystectomy    . Breast lumpectomy      left--node dissection 7/05 by Dr. Charlynne Pander  . Appendectomy      Family History  Problem Relation Age of  Onset  . Prostate cancer Father   . Prostate cancer Brother   . Stroke Mother   . Aneurysm Brother   . Emphysema Sister   . Crohn's disease Sister   . Liver disease Brother   . Hypertension Brother     Social History   Social History  . Marital Status: Married    Spouse Name: [name]  . Number of Children: 2  . Years of Education: N/A   Occupational History  . retired    Social History Main Topics  . Smoking status: Former Smoker -- 0.20 packs/day for 7 years    Types: Cigarettes    Quit date: 09/29/1970  . Smokeless tobacco: Never Used     Comment: 1 pack per week  . Alcohol Use: Yes     Comment: socially  . Drug Use: No  . Sexual Activity: Yes   Other Topics Concern  . Not on file   Social History Narrative   Review of Systems No nausea or vomiting Appetite is okay    Objective:   Physical Exam  Constitutional: She appears well-developed and well-nourished. No distress.  Abdominal: She exhibits no distension. There is no rebound and no guarding.  Slight suprapubic pressure but not really painful  Musculoskeletal:  No CVA tenderness          Assessment & Plan:

## 2015-10-19 NOTE — Progress Notes (Signed)
Pre visit review using our clinic review tool, if applicable. No additional management support is needed unless otherwise documented below in the visit note. 

## 2015-10-25 ENCOUNTER — Other Ambulatory Visit: Payer: Self-pay | Admitting: Family Medicine

## 2015-10-25 NOTE — Telephone Encounter (Signed)
Rx called in to requested pharmacy 

## 2015-10-25 NOTE — Telephone Encounter (Signed)
Pt has not had an anxiety f/u since establishing care in 2015. pls advise

## 2016-01-22 ENCOUNTER — Other Ambulatory Visit: Payer: Self-pay | Admitting: Family Medicine

## 2016-01-31 ENCOUNTER — Other Ambulatory Visit: Payer: Self-pay | Admitting: Family Medicine

## 2016-01-31 NOTE — Telephone Encounter (Signed)
Need f/u for additional refills.

## 2016-01-31 NOTE — Telephone Encounter (Signed)
Spoke to pt and advised OV required; scheduled and Rx to be filled at that time.

## 2016-01-31 NOTE — Telephone Encounter (Signed)
Pt has not had any f/u appts and last Rx indicates pt is to f/u for any additional refills. pls advise

## 2016-02-13 ENCOUNTER — Encounter: Payer: Self-pay | Admitting: Family Medicine

## 2016-02-13 ENCOUNTER — Ambulatory Visit (INDEPENDENT_AMBULATORY_CARE_PROVIDER_SITE_OTHER): Payer: Medicare HMO | Admitting: Family Medicine

## 2016-02-13 VITALS — BP 142/78 | HR 86 | Temp 97.6°F | Ht 65.0 in | Wt 183.0 lb

## 2016-02-13 DIAGNOSIS — M949 Disorder of cartilage, unspecified: Secondary | ICD-10-CM

## 2016-02-13 DIAGNOSIS — I10 Essential (primary) hypertension: Secondary | ICD-10-CM

## 2016-02-13 DIAGNOSIS — E038 Other specified hypothyroidism: Secondary | ICD-10-CM | POA: Diagnosis not present

## 2016-02-13 DIAGNOSIS — E785 Hyperlipidemia, unspecified: Secondary | ICD-10-CM

## 2016-02-13 DIAGNOSIS — F411 Generalized anxiety disorder: Secondary | ICD-10-CM | POA: Diagnosis not present

## 2016-02-13 DIAGNOSIS — Z01419 Encounter for gynecological examination (general) (routine) without abnormal findings: Secondary | ICD-10-CM

## 2016-02-13 DIAGNOSIS — M899 Disorder of bone, unspecified: Secondary | ICD-10-CM

## 2016-02-13 DIAGNOSIS — Z0000 Encounter for general adult medical examination without abnormal findings: Secondary | ICD-10-CM | POA: Diagnosis not present

## 2016-02-13 DIAGNOSIS — C50019 Malignant neoplasm of nipple and areola, unspecified female breast: Secondary | ICD-10-CM

## 2016-02-13 LAB — CBC WITH DIFFERENTIAL/PLATELET
BASOS ABS: 0.1 10*3/uL (ref 0.0–0.1)
Basophils Relative: 0.8 % (ref 0.0–3.0)
EOS ABS: 0.1 10*3/uL (ref 0.0–0.7)
Eosinophils Relative: 1.6 % (ref 0.0–5.0)
HCT: 44.2 % (ref 36.0–46.0)
HEMOGLOBIN: 14.9 g/dL (ref 12.0–15.0)
LYMPHS ABS: 1.6 10*3/uL (ref 0.7–4.0)
Lymphocytes Relative: 20.3 % (ref 12.0–46.0)
MCHC: 33.8 g/dL (ref 30.0–36.0)
MCV: 88.6 fl (ref 78.0–100.0)
MONO ABS: 0.6 10*3/uL (ref 0.1–1.0)
Monocytes Relative: 7.1 % (ref 3.0–12.0)
Neutro Abs: 5.6 10*3/uL (ref 1.4–7.7)
Neutrophils Relative %: 70.2 % (ref 43.0–77.0)
PLATELETS: 232 10*3/uL (ref 150.0–400.0)
RBC: 4.99 Mil/uL (ref 3.87–5.11)
RDW: 14.3 % (ref 11.5–15.5)
WBC: 8 10*3/uL (ref 4.0–10.5)

## 2016-02-13 LAB — COMPREHENSIVE METABOLIC PANEL
ALT: 70 U/L — ABNORMAL HIGH (ref 0–35)
AST: 67 U/L — ABNORMAL HIGH (ref 0–37)
Albumin: 4.1 g/dL (ref 3.5–5.2)
Alkaline Phosphatase: 93 U/L (ref 39–117)
BUN: 14 mg/dL (ref 6–23)
CO2: 23 mEq/L (ref 19–32)
Calcium: 9.9 mg/dL (ref 8.4–10.5)
Chloride: 107 mEq/L (ref 96–112)
Creatinine, Ser: 0.84 mg/dL (ref 0.40–1.20)
GFR: 70.06 mL/min (ref >60.00)
Glucose, Bld: 91 mg/dL (ref 70–99)
Potassium: 4.3 mEq/L (ref 3.5–5.1)
Sodium: 140 mEq/L (ref 135–145)
Total Bilirubin: 0.8 mg/dL (ref 0.2–1.2)
Total Protein: 7.6 g/dL (ref 6.0–8.3)

## 2016-02-13 LAB — LIPID PANEL
CHOL/HDL RATIO: 5
CHOLESTEROL: 208 mg/dL — AB (ref 0–200)
HDL: 42.7 mg/dL (ref >39.00)
LDL CALC: 132 mg/dL — AB (ref 0–99)
NonHDL: 165.38
TRIGLYCERIDES: 165 mg/dL — AB (ref 0.0–149.0)
VLDL: 33 mg/dL (ref 0.0–40.0)

## 2016-02-13 LAB — TSH: TSH: 2.97 u[IU]/mL (ref 0.35–4.50)

## 2016-02-13 LAB — T3, FREE: T3, Free: 2.7 pg/mL (ref 2.3–4.2)

## 2016-02-13 LAB — T4, FREE: Free T4: 1.25 ng/dL (ref 0.60–1.60)

## 2016-02-13 MED ORDER — AMLODIPINE BESYLATE 5 MG PO TABS: 5.0000 mg | ORAL_TABLET | Freq: Every day | ORAL | Status: DC

## 2016-02-13 MED ORDER — ALPRAZOLAM 0.5 MG PO TABS: 0.5000 mg | ORAL_TABLET | Freq: Three times a day (TID) | ORAL | Status: DC

## 2016-02-13 MED ORDER — OMEPRAZOLE 20 MG PO CPDR
DELAYED_RELEASE_CAPSULE | ORAL | Status: DC
Start: 2016-02-13 — End: 2017-02-17

## 2016-02-13 NOTE — Progress Notes (Signed)
Subjective:   Patient ID: [name], female    DOB: [birth year], 75 y.o.   MRN: [id number]  [name] is a pleasant 75 y.o. year old female who presents to clinic today with Annual Exam  CPX and follow up of chronic medical conditions on 02/13/2016  HPI:  I have personally reviewed the Medicare Annual Wellness questionnaire and have noted 1. The patient's medical and social history 2. Their use of alcohol, tobacco or illicit drugs 3. Their current medications and supplements 4. The patient's functional ability including ADL's, fall risks, home safety risks and hearing or visual             impairment. 5. Diet and physical activities 6. Evidence for depression or mood disorders  End of life wishes discussed and updated in Social History.  The roster of all physicians providing medical care to patient - is listed in the CareTeams section of the chart.  Remote h/o hysterectomy Colonoscopy 12/06/09 Pt has refused pneumococcal immunizations Mammogram 06/14/15  Unfortunately her sister, Norina Buzzard, died this year. She feels she is coping ok.  "she is in a better place."  Uses her xanax sparingly.  Back pain- ruptured lumbar disc.  Followed by Dr. Tonita Cong.  She is considering injections.  HTN- on Norvasc 5 mg daily. Denies CP, SOB or LE edema. Lab Results  Component Value Date   CREATININE 0.89 03/28/2015    Hypothyroidism- has been on synthroid 75 mcg daily. Denies any symptoms of hypo or hyperthyroidism. Lab Results  Component Value Date   TSH 3.42 03/28/2015     HLD- has refused statins.   Lab Results  Component Value Date   CHOL 209* 03/28/2015   HDL 44.70 03/28/2015   LDLCALC 133* 03/28/2015   LDLDIRECT 166.9 01/20/2013   TRIG 155.0* 03/28/2015   CHOLHDL 5 03/28/2015   H/o elevated LFTs- prob hepatic steatosis.   Lab Results  Component Value Date   ALT 88* 03/28/2015   AST 86* 03/28/2015   ALKPHOS 91 03/28/2015   BILITOT 0.7 03/28/2015    H/o breast CA- diagnosed in 2005.  S/p lumpectomy and node vx. S/p chemo and 5 years of tamoxifen.    Patient Active Problem List   Diagnosis Date Noted  . Medicare annual wellness visit, subsequent 02/13/2016  . Well woman exam 02/13/2016  . Back pain 07/03/2015  . Hepatic steatosis 11/27/2009  . COLONIC POLYPS, ADENOMATOUS, HX OF 11/27/2009  . Malignant neoplasm of female breast ([place]) 10/21/2007  . Hypothyroidism 10/21/2007  . DEGENERATIVE JOINT DISEASE 10/21/2007  . Disorder of bone and cartilage 10/21/2007  . HLD (hyperlipidemia) 09/17/2007  . Anxiety state 09/17/2007  . Essential hypertension 09/17/2007  . GERD 09/17/2007  . IRRITABLE BOWEL SYNDROME 09/17/2007  . FIBROMYALGIA 09/17/2007   Past Medical History  Diagnosis Date  . Hypertension   . Hyperlipidemia   . Hypothyroidism   . GERD (gastroesophageal reflux disease)   . IBS (irritable bowel syndrome)   . Colonic polyp     5 mm adenoma 1977  . NASH (nonalcoholic steatohepatitis)   . Adenocarcinoma, breast ([place])   . Degenerative joint disease   . Fibromyalgia   . Osteopenia   . Anxiety   . History of shingles   . History of anemia   . Hemorrhoids    Past Surgical History  Procedure Laterality Date  . Abdominal hysterectomy    . Bilateral salpingoophorectomy  1978  . Rotator cuff repair      left  .  Laparoscopic cholecystectomy    . Breast lumpectomy      left--node dissection 7/05 by Dr. Charlynne Pander  . Appendectomy     Social History  Substance Use Topics  . Smoking status: Former Smoker -- 0.20 packs/day for 7 years    Types: Cigarettes    Quit date: 09/29/1970  . Smokeless tobacco: Never Used     Comment: 1 pack per week  . Alcohol Use: Yes     Comment: socially   Family History  Problem Relation Age of Onset  . Prostate cancer Father   . Prostate cancer Brother   . Stroke Mother   . Aneurysm Brother   . Emphysema Sister   . Crohn's disease Sister   . Liver disease Brother   .  Hypertension Brother    Allergies  Allergen Reactions  . Ciprofloxacin Diarrhea  . Codeine     REACTION: nausea and vomiting   Current Outpatient Prescriptions on File Prior to Visit  Medication Sig Dispense Refill  . aspirin (ADULT ASPIRIN EC LOW STRENGTH) 81 MG EC tablet Take 81 mg by mouth daily.      Marland Kitchen levothyroxine (SYNTHROID, LEVOTHROID) 75 MCG tablet Take 1 tablet (75 mcg total) by mouth daily. OFFICE VISIT WITH LABS REQUIRED FOR ADDITIONAL REFILLS 90 tablet 0  . Multiple Vitamins-Calcium (ONE-A-DAY WOMENS FORMULA PO) Take 1 tablet by mouth daily.     No current facility-administered medications on file prior to visit.   The PMH, PSH, Social History, Family History, Medications, and allergies have been reviewed in [facility], and have been updated if relevant.   Review of Systems  Constitutional: Negative.   HENT: Negative.   Eyes: Negative.   Respiratory: Negative.   Cardiovascular: Negative.   Gastrointestinal: Negative.   Endocrine: Negative.   Genitourinary: Negative.   Musculoskeletal: Positive for back pain.  Skin: Negative.   Allergic/Immunologic: Negative.   Neurological: Negative.   Hematological: Negative.   Psychiatric/Behavioral: Negative.   All other systems reviewed and are negative.     Objective:    BP 142/78 mmHg  Pulse 86  Temp(Src) 97.6 F (36.4 C) (Oral)  Ht 5\' 5"  (1.651 m)  Wt 183 lb (83.008 kg)  BMI 30.45 kg/m2  SpO2 96%   Physical Exam  Constitutional: She is oriented to person, place, and time. She appears well-developed and well-nourished. No distress.  HENT:  Head: Normocephalic and atraumatic.  Neck: Normal range of motion.  Cardiovascular: Normal rate and regular rhythm.   Pulmonary/Chest: Effort normal and breath sounds normal.  Abdominal: Soft. Bowel sounds are normal. She exhibits no distension.  Musculoskeletal: Normal range of motion. She exhibits no edema.  Neurological: She is alert and oriented to person, place, and time.  No cranial nerve deficit.  Skin: Skin is warm and dry.  Psychiatric: She has a normal mood and affect. Her behavior is normal. Judgment and thought content normal.  Nursing note and vitals reviewed.           Assessment & Plan:   Anxiety state  HLD (hyperlipidemia)  Other specified hypothyroidism  Malignant neoplasm involving both nipple and areola in female, unspecified laterality ([place])  Disorder of bone and cartilage  Medicare annual wellness visit, subsequent  Well woman exam  Essential hypertension No Follow-up on file.

## 2016-02-13 NOTE — Assessment & Plan Note (Signed)
Reasonable control. No changes made. 

## 2016-02-13 NOTE — Assessment & Plan Note (Signed)
The patients weight, height, BMI and visual acuity have been recorded in the chart.  Cognitive function assessed.   I have made referrals, counseling and provided education to the patient based review of the above and I have provided the pt with a written personalized care plan for preventive services.  

## 2016-02-13 NOTE — Assessment & Plan Note (Signed)
Has refused statins.

## 2016-02-13 NOTE — Assessment & Plan Note (Signed)
Well controlled.  No changes made. Coping with recent loss well.

## 2016-02-13 NOTE — Patient Instructions (Addendum)
Great to see you. Happy Birthday.   We will call you with your lab results and you can view them online.  Please make an appointment to see Dr. Tonita Cong.

## 2016-02-13 NOTE — Assessment & Plan Note (Signed)
Continue current rx. Check labs today. 

## 2016-02-13 NOTE — Assessment & Plan Note (Signed)
Mammogram UTD. 

## 2016-02-14 ENCOUNTER — Encounter: Payer: Self-pay | Admitting: *Deleted

## 2016-04-23 ENCOUNTER — Other Ambulatory Visit: Payer: Self-pay | Admitting: Family Medicine

## 2016-04-23 MED ORDER — LEVOTHYROXINE SODIUM 75 MCG PO TABS
75.0000 ug | ORAL_TABLET | Freq: Every day | ORAL | 3 refills | Status: DC
Start: 2016-04-23 — End: 2017-03-04

## 2016-04-23 NOTE — Telephone Encounter (Signed)
Pt request cb about denial of refill. Left v/m requesting cb from pt.

## 2016-04-23 NOTE — Addendum Note (Signed)
Addended by: Modena Nunnery on: 04/23/2016 03:07 PM   Modules accepted: Orders

## 2016-04-23 NOTE — Telephone Encounter (Signed)
Denied in error.Rx has been sent to requested pharmacy. CPE 01/2016

## 2016-04-24 NOTE — Telephone Encounter (Signed)
Pt left note requesting refill xanax to [facility]. Last refilled # 90 on 02/13/16. Last seen annual 02/13/16.

## 2016-04-24 NOTE — Addendum Note (Signed)
Addended by: Helene Shoe on: 04/24/2016 05:13 PM   Modules accepted: Orders

## 2016-04-25 MED ORDER — ALPRAZOLAM 0.5 MG PO TABS: 0.5000 mg | ORAL_TABLET | Freq: Three times a day (TID) | ORAL | 0 refills | Status: DC

## 2016-04-25 NOTE — Telephone Encounter (Signed)
Rx called in to requested pharmacy 

## 2016-06-04 ENCOUNTER — Other Ambulatory Visit: Payer: Self-pay | Admitting: Family Medicine

## 2016-06-04 DIAGNOSIS — Z1231 Encounter for screening mammogram for malignant neoplasm of breast: Secondary | ICD-10-CM

## 2016-06-18 ENCOUNTER — Ambulatory Visit
Admission: RE | Admit: 2016-06-18 | Discharge: 2016-06-18 | Disposition: A | Discharge status: Home / Self Care | Payer: Medicare HMO | Source: Ambulatory Visit | Attending: Family Medicine | Admitting: Family Medicine

## 2016-06-18 DIAGNOSIS — Z1231 Encounter for screening mammogram for malignant neoplasm of breast: Secondary | ICD-10-CM

## 2016-07-23 ENCOUNTER — Other Ambulatory Visit: Payer: Self-pay | Admitting: *Deleted

## 2016-07-23 MED ORDER — ALPRAZOLAM 0.5 MG PO TABS: 0.5000 mg | ORAL_TABLET | Freq: Three times a day (TID) | ORAL | 0 refills | Status: DC

## 2016-07-23 NOTE — Telephone Encounter (Signed)
Rx called in to requested pharmacy 

## 2016-07-23 NOTE — Telephone Encounter (Signed)
Last f/u 01/2016-CPE 

## 2016-10-20 ENCOUNTER — Other Ambulatory Visit: Payer: Self-pay | Admitting: Family Medicine

## 2016-10-20 NOTE — Telephone Encounter (Signed)
Rx called in to requested pharmacy 

## 2016-10-20 NOTE — Telephone Encounter (Signed)
Last f/u 01/2016-CPE 

## 2016-12-29 ENCOUNTER — Other Ambulatory Visit: Payer: Self-pay | Admitting: Family Medicine

## 2016-12-29 NOTE — Telephone Encounter (Signed)
LAst filled 10-20-16 #90 Last OV 02-13-16 Next OV 02-17-17

## 2016-12-30 NOTE — Telephone Encounter (Signed)
Left refill on voice mail at pharmacy  

## 2016-12-30 NOTE — Telephone Encounter (Signed)
Pt wanted to verify generic xanax had been done.  I spoke with Amy at [facility] and she took rx verbally; did not get v/m earlier. Pt will ck with pharmacy later today.

## 2017-02-11 ENCOUNTER — Ambulatory Visit (INDEPENDENT_AMBULATORY_CARE_PROVIDER_SITE_OTHER): Payer: Medicare HMO

## 2017-02-11 VITALS — BP 130/74 | HR 84 | Temp 97.7°F | Ht 65.0 in | Wt 184.0 lb

## 2017-02-11 DIAGNOSIS — E038 Other specified hypothyroidism: Secondary | ICD-10-CM

## 2017-02-11 DIAGNOSIS — E784 Other hyperlipidemia: Secondary | ICD-10-CM | POA: Diagnosis not present

## 2017-02-11 DIAGNOSIS — I10 Essential (primary) hypertension: Secondary | ICD-10-CM | POA: Diagnosis not present

## 2017-02-11 DIAGNOSIS — Z0000 Encounter for general adult medical examination without abnormal findings: Secondary | ICD-10-CM | POA: Diagnosis not present

## 2017-02-11 DIAGNOSIS — E7849 Other hyperlipidemia: Secondary | ICD-10-CM

## 2017-02-11 LAB — CBC WITH DIFFERENTIAL/PLATELET
Basophils Absolute: 0.1 10*3/uL (ref 0.0–0.1)
Basophils Relative: 1.2 % (ref 0.0–3.0)
EOS PCT: 2.8 % (ref 0.0–5.0)
Eosinophils Absolute: 0.2 10*3/uL (ref 0.0–0.7)
HCT: 43.8 % (ref 36.0–46.0)
HEMOGLOBIN: 14.8 g/dL (ref 12.0–15.0)
LYMPHS PCT: 20.3 % (ref 12.0–46.0)
Lymphs Abs: 1.3 10*3/uL (ref 0.7–4.0)
MCHC: 33.7 g/dL (ref 30.0–36.0)
MCV: 89 fl (ref 78.0–100.0)
MONO ABS: 0.6 10*3/uL (ref 0.1–1.0)
MONOS PCT: 9.6 % (ref 3.0–12.0)
Neutro Abs: 4.4 10*3/uL (ref 1.4–7.7)
Neutrophils Relative %: 66.1 % (ref 43.0–77.0)
Platelets: 222 10*3/uL (ref 150.0–400.0)
RBC: 4.92 Mil/uL (ref 3.87–5.11)
RDW: 14.2 % (ref 11.5–15.5)
WBC: 6.6 10*3/uL (ref 4.0–10.5)

## 2017-02-11 LAB — COMPREHENSIVE METABOLIC PANEL
ALBUMIN: 4 g/dL (ref 3.5–5.2)
ALK PHOS: 103 U/L (ref 39–117)
ALT: 51 U/L — ABNORMAL HIGH (ref 0–35)
AST: 57 U/L — ABNORMAL HIGH (ref 0–37)
BUN: 15 mg/dL (ref 6–23)
CHLORIDE: 105 meq/L (ref 96–112)
CO2: 24 mEq/L (ref 19–32)
Calcium: 9.8 mg/dL (ref 8.4–10.5)
Creatinine, Ser: 0.82 mg/dL (ref 0.40–1.20)
GFR: 71.84 mL/min (ref >60.00)
Glucose, Bld: 96 mg/dL (ref 70–99)
POTASSIUM: 4.1 meq/L (ref 3.5–5.1)
SODIUM: 137 meq/L (ref 135–145)
TOTAL PROTEIN: 7.6 g/dL (ref 6.0–8.3)
Total Bilirubin: 0.7 mg/dL (ref 0.2–1.2)

## 2017-02-11 LAB — LIPID PANEL
CHOLESTEROL: 202 mg/dL — AB (ref 0–200)
HDL: 48.8 mg/dL (ref >39.00)
LDL CALC: 125 mg/dL — AB (ref 0–99)
NonHDL: 153.62
Total CHOL/HDL Ratio: 4
Triglycerides: 145 mg/dL (ref 0.0–149.0)
VLDL: 29 mg/dL (ref 0.0–40.0)

## 2017-02-11 LAB — T3, FREE: T3, Free: 2.8 pg/mL (ref 2.3–4.2)

## 2017-02-11 LAB — TSH: TSH: 3.24 u[IU]/mL (ref 0.35–4.50)

## 2017-02-11 LAB — T4, FREE: Free T4: 1.34 ng/dL (ref 0.60–1.60)

## 2017-02-11 NOTE — Progress Notes (Signed)
Subjective:   [name] is a 76 y.o. female who presents for Medicare Annual (Subsequent) preventive examination.  Review of Systems: N/A Cardiac Risk Factors include: advanced age (>29men, >65 women);dyslipidemia;hypertension;obesity (BMI >30kg/m2)     Objective:     Vitals: BP 130/74 (BP Location: Right Arm, Patient Position: Sitting, Cuff Size: Normal)   Pulse 84   Temp 97.7 F (36.5 C) (Oral)   Ht 5\' 5"  (1.651 m) Comment: no shoes  Wt 184 lb (83.5 kg)   SpO2 96%   BMI 30.62 kg/m   Body mass index is 30.62 kg/m.   Tobacco History  Smoking Status  . Former Smoker  . Packs/day: 0.20  . Years: 7.00  . Types: Cigarettes  . Quit date: 09/29/1970  Smokeless Tobacco  . Never Used    Comment: 1 pack per week     Counseling given: No   Past Medical History:  Diagnosis Date  . Adenocarcinoma, breast ([place])   . Anxiety   . Colonic polyp    5 mm adenoma 1977  . Degenerative joint disease   . Fibromyalgia   . GERD (gastroesophageal reflux disease)   . Hemorrhoids   . History of anemia   . History of shingles   . Hyperlipidemia   . Hypertension   . Hypothyroidism   . IBS (irritable bowel syndrome)   . NASH (nonalcoholic steatohepatitis)   . Osteopenia    Past Surgical History:  Procedure Laterality Date  . ABDOMINAL HYSTERECTOMY    . APPENDECTOMY    . BILATERAL SALPINGOOPHORECTOMY  1978  . BREAST LUMPECTOMY     left--node dissection 7/05 by Dr. Charlynne Pander  . LAPAROSCOPIC CHOLECYSTECTOMY    . ROTATOR CUFF REPAIR     left   Family History  Problem Relation Age of Onset  . Prostate cancer Father   . Prostate cancer Brother   . Stroke Mother   . Aneurysm Brother   . Emphysema Sister   . Crohn's disease Sister   . Liver disease Brother   . Hypertension Brother    History  Sexual Activity  . Sexual activity: Yes    Outpatient Encounter Prescriptions as of 02/11/2017  Medication Sig  . ALPRAZolam (XANAX) 0.5 MG tablet TAKE 1 TABLET BY MOUTH  THREE TIMES A DAY  . amLODipine (NORVASC) 5 MG tablet Take 1 tablet (5 mg total) by mouth daily.  Marland Kitchen aspirin (ADULT ASPIRIN EC LOW STRENGTH) 81 MG EC tablet Take 81 mg by mouth daily.    Marland Kitchen levothyroxine (SYNTHROID, LEVOTHROID) 75 MCG tablet Take 1 tablet (75 mcg total) by mouth daily.  . Multiple Vitamins-Calcium (ONE-A-DAY WOMENS FORMULA PO) Take 1 tablet by mouth daily.  Marland Kitchen omeprazole (PRILOSEC) 20 MG capsule TAKE ONE (1) CAPSULE BY MOUTH EACH DAY   No facility-administered encounter medications on file as of 02/11/2017.     Activities of Daily Living In your present state of health, do you have any difficulty performing the following activities: 02/11/2017  Hearing? N  Vision? N  Difficulty concentrating or making decisions? N  Walking or climbing stairs? N  Dressing or bathing? N  Doing errands, shopping? N  Preparing Food and eating ? N  Using the Toilet? N  In the past six months, have you accidently leaked urine? Y  Do you have problems with loss of bowel control? N  Managing your Medications? N  Managing your Finances? N  Housekeeping or managing your Housekeeping? N  Some recent data might be hidden  Patient Care Team: Lucille Passy, MD as PCP - General (Family Medicine) Gatha Mayer, MD as Consulting Physician (Gastroenterology) Magrinat, Virgie Dad, MD as Consulting Physician (Oncology) Susa Day, MD as Consulting Physician (Orthopedic Surgery)    Assessment:     Hearing Screening   125Hz  250Hz  500Hz  1000Hz  2000Hz  3000Hz  4000Hz  6000Hz  8000Hz   Right ear:   40 40 40  40    Left ear:   40 0 0  40      Visual Acuity Screening   Right eye Left eye Both eyes  Without correction: 20/25-1 20/25-1 20/25-1  With correction:       Exercise Activities and Dietary recommendations Current Exercise Habits: The patient does not participate in regular exercise at present (walking; garden work), Exercise limited by: None identified  Goals    . Increase water intake  (pt-stated)          Starting 02/11/2017, I will continue to drink at least 6-8 glasses of water daily to stay as healthy as I can as long as I can.       Fall Risk Fall Risk  02/11/2017 02/13/2016 01/20/2013  Falls in the past year? No No No   Depression Screen PHQ 2/9 Scores 02/11/2017 02/13/2016 01/20/2013  PHQ - 2 Score 0 0 0     Cognitive Function MMSE - Mini Mental State Exam 02/11/2017  Orientation to time 5  Orientation to Place 5  Registration 3  Attention/ Calculation 0  Recall 3  Language- name 2 objects 0  Language- repeat 1  Language- follow 3 step command 3  Language- read & follow direction 0  Write a sentence 0  Copy design 0  Total score 20     PLEASE NOTE: A Mini-Cog screen was completed. Maximum score is 20. A value of 0 denotes this part of Folstein MMSE was not completed or the patient failed this part of the Mini-Cog screening.   Mini-Cog Screening Orientation to Time - Max 5 pts Orientation to Place - Max 5 pts Registration - Max 3 pts Recall - Max 3 pts Language Repeat - Max 1 pts Language Follow 3 Step Command - Max 3 pts     Immunization History  Administered Date(s) Administered  . Tdap 01/15/2011   Screening Tests Health Maintenance  Topic Date Due  . INFLUENZA VACCINE  02/11/2026 (Originally 04/29/2017)  . PNA vac Low Risk Adult (1 of 2 - PCV13) 02/11/2026 (Originally 01/29/2005)  . TETANUS/TDAP  01/14/2021  . DEXA SCAN  Completed      Plan:     I have personally reviewed and addressed the Medicare Annual Wellness questionnaire and have noted the following in the patient's chart:  A. Medical and social history B. Use of alcohol, tobacco or illicit drugs  C. Current medications and supplements D. Functional ability and status E.  Nutritional status F.  Physical activity G. Advance directives H. List of other physicians I.  Hospitalizations, surgeries, and ER visits in previous 12 months J.  [place] to include hearing,  vision, cognitive, depression L. Referrals and appointments - none  In addition, I have reviewed and discussed with patient certain preventive protocols, quality metrics, and best practice recommendations. A written personalized care plan for preventive services as well as general preventive health recommendations were provided to patient.  See attached scanned questionnaire for additional information.   Signed,   Lindell Noe, MHA, BS, LPN Health Coach

## 2017-02-11 NOTE — Progress Notes (Signed)
I reviewed health advisor's note, was available for consultation, and agree with documentation and plan.  

## 2017-02-11 NOTE — Progress Notes (Signed)
PCP notes:   Health maintenance:  PNA vaccine - pt declined  Abnormal screenings:   Hearing - failed  Patient concerns:   None  Nurse concerns:  None  Next PCP appt:   02/17/17 @ 0915

## 2017-02-11 NOTE — Progress Notes (Signed)
Pre visit review using our clinic review tool, if applicable. No additional management support is needed unless otherwise documented below in the visit note. 

## 2017-02-11 NOTE — Patient Instructions (Signed)
[name] , Thank you for taking time to come for your Medicare Wellness Visit. I appreciate your ongoing commitment to your health goals. Please review the following plan we discussed and let me know if I can assist you in the future.   These are the goals we discussed: Goals    . Increase water intake (pt-stated)          Starting 02/11/2017, I will continue to drink at least 6-8 glasses of water daily to stay as healthy as I can as long as I can.        This is a list of the screening recommended for you and due dates:  Health Maintenance  Topic Date Due  . Flu Shot  02/11/2026*  . Pneumonia vaccines (1 of 2 - PCV13) 02/11/2026*  . Tetanus Vaccine  01/14/2021  . DEXA scan (bone density measurement)  Completed  *Topic was postponed. The date shown is not the original due date.   Preventive Care for Adults  A healthy lifestyle and preventive care can promote health and wellness. Preventive health guidelines for adults include the following key practices.  . A routine yearly physical is a good way to check with your health care provider about your health and preventive screening. It is a chance to share any concerns and updates on your health and to receive a thorough exam.  . Visit your dentist for a routine exam and preventive care every 6 months. Brush your teeth twice a day and floss once a day. Good oral hygiene prevents tooth decay and gum disease.  . The frequency of eye exams is based on your age, health, family medical history, use  of contact lenses, and other factors. Follow your health care provider's ecommendations for frequency of eye exams.  . Eat a healthy diet. Foods like vegetables, fruits, whole grains, low-fat dairy products, and lean protein foods contain the nutrients you need without too many calories. Decrease your intake of foods high in solid fats, added sugars, and salt. Eat the right amount of calories for you. Get information about a proper diet from  your health care provider, if necessary.  . Regular physical exercise is one of the most important things you can do for your health. Most adults should get at least 150 minutes of moderate-intensity exercise (any activity that increases your heart rate and causes you to sweat) each week. In addition, most adults need muscle-strengthening exercises on 2 or more days a week.  Silver Sneakers may be a benefit available to you. To determine eligibility, you may visit the website: www.silversneakers.com or contact program at 503-336-2965 Mon-Fri between 8AM-8PM.   . Maintain a healthy weight. The body mass index (BMI) is a screening tool to identify possible weight problems. It provides an estimate of body fat based on height and weight. Your health care provider can find your BMI and can help you achieve or maintain a healthy weight.   For adults 20 years and older: ? A BMI below 18.5 is considered underweight. ? A BMI of 18.5 to 24.9 is normal. ? A BMI of 25 to 29.9 is considered overweight. ? A BMI of 30 and above is considered obese.   . Maintain normal blood lipids and cholesterol levels by exercising and minimizing your intake of saturated fat. Eat a balanced diet with plenty of fruit and vegetables. Blood tests for lipids and cholesterol should begin at age 30 and be repeated every 5 years. If your lipid or cholesterol  levels are high, you are over 50, or you are at high risk for heart disease, you may need your cholesterol levels checked more frequently. Ongoing high lipid and cholesterol levels should be treated with medicines if diet and exercise are not working.  . If you smoke, find out from your health care provider how to quit. If you do not use tobacco, please do not start.  . If you choose to drink alcohol, please do not consume more than 2 drinks per day. One drink is considered to be 12 ounces (355 mL) of beer, 5 ounces (148 mL) of wine, or 1.5 ounces (44 mL) of liquor.  . If you  are 17-61 years old, ask your health care provider if you should take aspirin to prevent strokes.  . Use sunscreen. Apply sunscreen liberally and repeatedly throughout the day. You should seek shade when your shadow is shorter than you. Protect yourself by wearing long sleeves, pants, a wide-brimmed hat, and sunglasses year round, whenever you are outdoors.  . Once a month, do a whole body skin exam, using a mirror to look at the skin on your back. Tell your health care provider of new moles, moles that have irregular borders, moles that are larger than a pencil eraser, or moles that have changed in shape or color.

## 2017-02-13 ENCOUNTER — Ambulatory Visit: Payer: Medicare HMO

## 2017-02-17 ENCOUNTER — Ambulatory Visit (INDEPENDENT_AMBULATORY_CARE_PROVIDER_SITE_OTHER): Payer: Medicare HMO | Admitting: Family Medicine

## 2017-02-17 VITALS — BP 138/88 | HR 88 | Temp 98.0°F | Ht 65.0 in | Wt 185.0 lb

## 2017-02-17 DIAGNOSIS — K219 Gastro-esophageal reflux disease without esophagitis: Secondary | ICD-10-CM

## 2017-02-17 DIAGNOSIS — E038 Other specified hypothyroidism: Secondary | ICD-10-CM | POA: Diagnosis not present

## 2017-02-17 DIAGNOSIS — I10 Essential (primary) hypertension: Secondary | ICD-10-CM | POA: Diagnosis not present

## 2017-02-17 DIAGNOSIS — Z0000 Encounter for general adult medical examination without abnormal findings: Secondary | ICD-10-CM | POA: Diagnosis not present

## 2017-02-17 DIAGNOSIS — Z01419 Encounter for gynecological examination (general) (routine) without abnormal findings: Secondary | ICD-10-CM

## 2017-02-17 DIAGNOSIS — E785 Hyperlipidemia, unspecified: Secondary | ICD-10-CM

## 2017-02-17 MED ORDER — OMEPRAZOLE 40 MG PO CPDR: 40.0000 mg | DELAYED_RELEASE_CAPSULE | Freq: Every day | ORAL | 3 refills | Status: DC

## 2017-02-17 NOTE — Assessment & Plan Note (Signed)
Deteriorated. Increase omeprazole to 40 mg daily. eRx sent. Call or return to clinic prn if these symptoms worsen or fail to improve as anticipated.

## 2017-02-17 NOTE — Assessment & Plan Note (Signed)
Euthyroid. No changes made to rxs today.

## 2017-02-17 NOTE — Progress Notes (Signed)
Pre visit review using our clinic review tool, if applicable. No additional management support is needed unless otherwise documented below in the visit note. 

## 2017-02-17 NOTE — Assessment & Plan Note (Signed)
Reviewed preventive care protocols, scheduled due services, and updated immunizations Discussed nutrition, exercise, diet, and healthy lifestyle.  

## 2017-02-17 NOTE — Assessment & Plan Note (Signed)
Normotensive. No changes made today. 

## 2017-02-17 NOTE — Progress Notes (Signed)
Subjective:   Patient ID: [name], female    DOB: 06-11-1941, 76 y.o.   MRN: [id number]  [name] is a pleasant 76 y.o. year old female who presents to clinic today with Annual Exam (PRILOSEC innefective needs "something stronger") and follow up of chronic medical conditions on 02/17/2017  HPI:  Annual medicare wellness visit on 02/12/16 with Lindell Noe, RN on 02/11/17. Note reviewed.   Remote h/o hysterectomy Colonoscopy 12/06/09 Pt has refused pneumococcal immunizations Mammogram 06/18/16   HTN- on Norvasc 5 mg daily. Denies CP, SOB or LE edema. Lab Results  Component Value Date   CREATININE 0.82 02/11/2017    Hypothyroidism- has been on synthroid 75 mcg daily. Denies any symptoms of hypo or hyperthyroidism. Lab Results  Component Value Date   TSH 3.24 02/11/2017     HLD- much improved this year. Lab Results  Component Value Date   CHOL 202 (H) 02/11/2017   HDL 48.80 02/11/2017   LDLCALC 125 (H) 02/11/2017   LDLDIRECT 166.9 01/20/2013   TRIG 145.0 02/11/2017   CHOLHDL 4 02/11/2017   H/o elevated LFTs- prob hepatic steatosis.   Lab Results  Component Value Date   ALT 51 (H) 02/11/2017   AST 57 (H) 02/11/2017   ALKPHOS 103 02/11/2017   BILITOT 0.7 02/11/2017   H/o breast CA- diagnosed in 2005.  S/p lumpectomy and node vx. S/p chemo and 5 years of tamoxifen.   GERD-  Feels prilosec os no longer effective.  Patient Active Problem List   Diagnosis Date Noted  . Medicare annual wellness visit, subsequent 02/13/2016  . Well woman exam 02/13/2016  . Back pain 07/03/2015  . Hepatic steatosis 11/27/2009  . COLONIC POLYPS, ADENOMATOUS, HX OF 11/27/2009  . Malignant neoplasm of female breast ([place]) 10/21/2007  . Hypothyroidism 10/21/2007  . DEGENERATIVE JOINT DISEASE 10/21/2007  . Disorder of bone and cartilage 10/21/2007  . HLD (hyperlipidemia) 09/17/2007  . Anxiety state 09/17/2007  . Essential hypertension 09/17/2007  . GERD 09/17/2007    . IRRITABLE BOWEL SYNDROME 09/17/2007  . FIBROMYALGIA 09/17/2007   Past Medical History:  Diagnosis Date  . Adenocarcinoma, breast ([place])   . Anxiety   . Colonic polyp    5 mm adenoma 1977  . Degenerative joint disease   . Fibromyalgia   . GERD (gastroesophageal reflux disease)   . Hemorrhoids   . History of anemia   . History of shingles   . Hyperlipidemia   . Hypertension   . Hypothyroidism   . IBS (irritable bowel syndrome)   . NASH (nonalcoholic steatohepatitis)   . Osteopenia    Past Surgical History:  Procedure Laterality Date  . ABDOMINAL HYSTERECTOMY    . APPENDECTOMY    . BILATERAL SALPINGOOPHORECTOMY  1978  . BREAST LUMPECTOMY     left--node dissection 7/05 by Dr. Charlynne Pander  . LAPAROSCOPIC CHOLECYSTECTOMY    . ROTATOR CUFF REPAIR     left   Social History  Substance Use Topics  . Smoking status: Former Smoker    Packs/day: 0.20    Years: 7.00    Types: Cigarettes    Quit date: 09/29/1970  . Smokeless tobacco: Never Used     Comment: 1 pack per week  . Alcohol use Yes     Comment: socially   Family History  Problem Relation Age of Onset  . Prostate cancer Father   . Prostate cancer Brother   . Stroke Mother   . Aneurysm Brother   . Emphysema  Sister   . Crohn's disease Sister   . Liver disease Brother   . Hypertension Brother    Allergies  Allergen Reactions  . Ciprofloxacin Diarrhea  . Codeine     REACTION: nausea and vomiting   Current Outpatient Prescriptions on File Prior to Visit  Medication Sig Dispense Refill  . ALPRAZolam (XANAX) 0.5 MG tablet TAKE 1 TABLET BY MOUTH THREE TIMES A DAY 90 tablet 0  . amLODipine (NORVASC) 5 MG tablet Take 1 tablet (5 mg total) by mouth daily. 90 tablet 3  . aspirin (ADULT ASPIRIN EC LOW STRENGTH) 81 MG EC tablet Take 81 mg by mouth daily.      Marland Kitchen levothyroxine (SYNTHROID, LEVOTHROID) 75 MCG tablet Take 1 tablet (75 mcg total) by mouth daily. 90 tablet 3  . Multiple Vitamins-Calcium (ONE-A-DAY WOMENS FORMULA  PO) Take 1 tablet by mouth daily.     No current facility-administered medications on file prior to visit.    The PMH, PSH, Social History, Family History, Medications, and allergies have been reviewed in [facility], and have been updated if relevant.   Review of Systems  Constitutional: Negative.   HENT: Negative.   Eyes: Negative.   Respiratory: Negative.   Cardiovascular: Negative.   Gastrointestinal: Negative.   Endocrine: Negative.   Genitourinary: Negative.   Musculoskeletal: Positive for back pain.  Skin: Negative.   Allergic/Immunologic: Negative.   Neurological: Negative.   Hematological: Negative.   Psychiatric/Behavioral: Negative.   All other systems reviewed and are negative.     Objective:    BP 138/88   Pulse 88   Temp 98 F (36.7 C)   Ht 5\' 5"  (1.651 m)   Wt 185 lb (83.9 kg)   SpO2 98%   BMI 30.79 kg/m    Physical Exam  Constitutional: She is oriented to person, place, and time. She appears well-developed and well-nourished. No distress.  HENT:  Head: Normocephalic and atraumatic.  Neck: Normal range of motion.  Cardiovascular: Normal rate and regular rhythm.   Pulmonary/Chest: Effort normal and breath sounds normal.  Abdominal: Soft. Bowel sounds are normal. She exhibits no distension.  Musculoskeletal: Normal range of motion. She exhibits no edema.  Neurological: She is alert and oriented to person, place, and time. No cranial nerve deficit.  Skin: Skin is warm and dry.  Psychiatric: She has a normal mood and affect. Her behavior is normal. Judgment and thought content normal.  Nursing note and vitals reviewed.           Assessment & Plan:   No diagnosis found. No Follow-up on file.

## 2017-03-03 ENCOUNTER — Other Ambulatory Visit: Payer: Self-pay | Admitting: Family Medicine

## 2017-03-03 NOTE — Telephone Encounter (Signed)
Called in to [place], [place]: [phone]

## 2017-03-03 NOTE — Telephone Encounter (Signed)
Last refill 12/29/16, last OV 02/17/17

## 2017-03-04 ENCOUNTER — Telehealth: Payer: Self-pay

## 2017-03-04 MED ORDER — OMEPRAZOLE 40 MG PO CPDR: 40.0000 mg | DELAYED_RELEASE_CAPSULE | Freq: Every day | ORAL | 0 refills | Status: DC

## 2017-03-04 MED ORDER — LEVOTHYROXINE SODIUM 75 MCG PO TABS: 75.0000 ug | ORAL_TABLET | Freq: Every day | ORAL | 3 refills | Status: DC

## 2017-03-04 MED ORDER — AMLODIPINE BESYLATE 5 MG PO TABS: 5.0000 mg | ORAL_TABLET | Freq: Every day | ORAL | 3 refills | Status: DC

## 2017-03-04 NOTE — Telephone Encounter (Signed)
Pt called back and advised per note and pt voiced understanding and was appreciative.

## 2017-03-04 NOTE — Telephone Encounter (Signed)
Pt left v/m requesting all her meds filled for 90 day refills including xanax. I was unable to reach pt by phone and left v/m requesting cb. I refilled amlodipine,levothyroxine and omeprazole for 90 days but xanax already sent in for 30 day rx. (last time xanax filled for 90 days was 03/12/14) I spoke with Bea at [facility] and requested to dc omeprazole refills # 30 x 3. Bea voiced understanding.will wait for pt to cb so I can explain.

## 2017-05-11 ENCOUNTER — Other Ambulatory Visit: Payer: Self-pay | Admitting: Family Medicine

## 2017-05-12 NOTE — Telephone Encounter (Signed)
Patient called to find out if medication will be called in to pharmacy today.  Patient said she has 1 pill left.  Please call patient at [phone] when rx is called in to pharmacy.

## 2017-05-13 NOTE — Telephone Encounter (Signed)
Left refill on voice mail at pharmacy  

## 2017-05-13 NOTE — Telephone Encounter (Addendum)
Spoke to the pt. She said she was told yesterday that she had to come get the written rx at the office. She took the written rx to the pharmacy. I called [facility] and cancelled the rx.

## 2017-06-08 ENCOUNTER — Other Ambulatory Visit: Payer: Self-pay | Admitting: Family Medicine

## 2017-06-08 DIAGNOSIS — Z1231 Encounter for screening mammogram for malignant neoplasm of breast: Secondary | ICD-10-CM

## 2017-06-19 ENCOUNTER — Ambulatory Visit
Admission: RE | Admit: 2017-06-19 | Discharge: 2017-06-19 | Disposition: A | Discharge status: Home / Self Care | Payer: Medicare HMO | Source: Ambulatory Visit | Attending: Family Medicine | Admitting: Family Medicine

## 2017-06-19 DIAGNOSIS — Z1231 Encounter for screening mammogram for malignant neoplasm of breast: Secondary | ICD-10-CM | POA: Diagnosis not present

## 2017-06-19 HISTORY — DX: Personal history of antineoplastic chemotherapy: Z92.21

## 2017-06-19 HISTORY — DX: Personal history of irradiation: Z92.3

## 2017-07-20 ENCOUNTER — Encounter: Payer: Self-pay | Admitting: Primary Care

## 2017-07-20 ENCOUNTER — Ambulatory Visit (INDEPENDENT_AMBULATORY_CARE_PROVIDER_SITE_OTHER): Payer: Medicare HMO | Admitting: Primary Care

## 2017-07-20 ENCOUNTER — Other Ambulatory Visit: Payer: Self-pay | Admitting: Family Medicine

## 2017-07-20 ENCOUNTER — Encounter: Payer: Self-pay | Admitting: Radiology

## 2017-07-20 VITALS — BP 128/78 | HR 74 | Temp 98.2°F | Ht 65.0 in | Wt 184.0 lb

## 2017-07-20 DIAGNOSIS — E038 Other specified hypothyroidism: Secondary | ICD-10-CM | POA: Diagnosis not present

## 2017-07-20 DIAGNOSIS — K219 Gastro-esophageal reflux disease without esophagitis: Secondary | ICD-10-CM | POA: Diagnosis not present

## 2017-07-20 DIAGNOSIS — E785 Hyperlipidemia, unspecified: Secondary | ICD-10-CM | POA: Diagnosis not present

## 2017-07-20 DIAGNOSIS — F411 Generalized anxiety disorder: Secondary | ICD-10-CM

## 2017-07-20 DIAGNOSIS — Z79899 Other long term (current) drug therapy: Secondary | ICD-10-CM | POA: Diagnosis not present

## 2017-07-20 DIAGNOSIS — I10 Essential (primary) hypertension: Secondary | ICD-10-CM

## 2017-07-20 DIAGNOSIS — G8929 Other chronic pain: Secondary | ICD-10-CM | POA: Diagnosis not present

## 2017-07-20 DIAGNOSIS — M5440 Lumbago with sciatica, unspecified side: Secondary | ICD-10-CM

## 2017-07-20 DIAGNOSIS — R69 Illness, unspecified: Secondary | ICD-10-CM | POA: Diagnosis not present

## 2017-07-20 MED ORDER — ALPRAZOLAM 0.5 MG PO TABS: ORAL_TABLET | ORAL | 0 refills | Status: DC

## 2017-07-20 NOTE — Assessment & Plan Note (Signed)
Recent lipid panel overall stable. Work on diet and regular exercise.

## 2017-07-20 NOTE — Assessment & Plan Note (Signed)
Requires omeprazole at the 40 mg dose, continue same.

## 2017-07-20 NOTE — Progress Notes (Signed)
Subjective:    Patient ID: [name], female    DOB: Dec 04, 1940, 76 y.o.   MRN: [id number]  HPI  [name] is a 76 year old female who presents today to transfer care from Dr. Deborra Medina.  1) GAD: Currently managed on alprazolam 0.5 mg for which she takes once daily for anxiety and then twice weekly an extra half tablet at night to help her sleep. She's been taking this since 2006. She feels well managed on this regimen.   2) Hypothyroidism: Diagnosed in 2006 after chemotherapy after breast cancer diagnosis. Currently managed on levothyroxine 75 mcg. TSH was normal in May 2018.  3) GERD: Currently managed on omeprazole 40 mg. She will experience symptoms of esophageal burning, esophageal reflux without her medication. She feels well managed on this regimen.  4) Essential Hypertension: Currently managed on amlodipine 5 mg. Her BP in the office today is 128/78. She checks her BP at home which will range 120's/70's. She denies chest pain, shortness of breath, dizziness.  Review of Systems  Eyes: Negative for visual disturbance.  Respiratory: Negative for shortness of breath.   Cardiovascular: Negative for chest pain.  Musculoskeletal:       Chronic arthralgias.   Psychiatric/Behavioral: Positive for sleep disturbance. The patient is nervous/anxious.        Past Medical History:  Diagnosis Date  . Adenocarcinoma, breast ([place])   . Anxiety   . Colonic polyp    5 mm adenoma 1977  . Degenerative joint disease   . Fibromyalgia   . GERD (gastroesophageal reflux disease)   . Hemorrhoids   . History of anemia   . History of shingles   . Hyperlipidemia   . Hypertension   . Hypothyroidism   . IBS (irritable bowel syndrome)   . NASH (nonalcoholic steatohepatitis)   . Osteopenia   . Personal history of chemotherapy 2005   Left Breast Cancer  . Personal history of radiation therapy 2005   Left Breast Cancer     Social History   Social History  . Marital status: Married   Spouse name: [name]  . Number of children: 2  . Years of education: N/A   Occupational History  . retired    Social History Main Topics  . Smoking status: Former Smoker    Packs/day: 0.20    Years: 7.00    Types: Cigarettes    Quit date: 09/29/1970  . Smokeless tobacco: Never Used     Comment: 1 pack per week  . Alcohol use Yes     Comment: socially  . Drug use: No  . Sexual activity: Yes   Other Topics Concern  . Not on file   Social History Narrative   Would desire CPR    Past Surgical History:  Procedure Laterality Date  . ABDOMINAL HYSTERECTOMY    . APPENDECTOMY    . BILATERAL SALPINGOOPHORECTOMY  1978  . BREAST LUMPECTOMY Left 2005   left--node dissection 7/05 by Dr. Charlynne Pander  . LAPAROSCOPIC CHOLECYSTECTOMY    . ROTATOR CUFF REPAIR     left    Family History  Problem Relation Age of Onset  . Prostate cancer Father   . Prostate cancer Brother   . Stroke Mother   . Aneurysm Brother   . Emphysema Sister   . Crohn's disease Sister   . Liver disease Brother   . Hypertension Brother     Allergies  Allergen Reactions  . Ciprofloxacin Diarrhea  . Codeine  REACTION: nausea and vomiting    Current Outpatient Prescriptions on File Prior to Visit  Medication Sig Dispense Refill  . amLODipine (NORVASC) 5 MG tablet Take 1 tablet (5 mg total) by mouth daily. 90 tablet 3  . aspirin (ADULT ASPIRIN EC LOW STRENGTH) 81 MG EC tablet Take 81 mg by mouth daily.      Marland Kitchen levothyroxine (SYNTHROID, LEVOTHROID) 75 MCG tablet Take 1 tablet (75 mcg total) by mouth daily. 90 tablet 3  . Multiple Vitamins-Calcium (ONE-A-DAY WOMENS FORMULA PO) Take 1 tablet by mouth daily.    Marland Kitchen omeprazole (PRILOSEC) 40 MG capsule Take 1 capsule (40 mg total) by mouth daily. 90 capsule 0   No current facility-administered medications on file prior to visit.     BP 128/78   Pulse 74   Temp 98.2 F (36.8 C) (Oral)   Ht 5\' 5"  (1.651 m)   Wt 184 lb (83.5 kg)   SpO2 98%   BMI 30.62 kg/m     Objective:   Physical Exam  Constitutional: She appears well-nourished.  Neck: Neck supple.  Cardiovascular: Normal rate and regular rhythm.   Pulmonary/Chest: Effort normal and breath sounds normal.  Skin: Skin is warm and dry.  Psychiatric: She has a normal mood and affect.          Assessment & Plan:

## 2017-07-20 NOTE — Assessment & Plan Note (Signed)
Overall manages well on her own, will occasionally take 1/2 tablet of alprazolam to use at night to help her rest.

## 2017-07-20 NOTE — Assessment & Plan Note (Signed)
Stable in the office today, continue Amlodipine 5 mg.

## 2017-07-20 NOTE — Assessment & Plan Note (Signed)
Recent TSH stable, continue levothyroxine 75 mcg. 

## 2017-07-20 NOTE — Assessment & Plan Note (Signed)
Managed on alprazolam for daily anxiety for years.  Discussed that this is not best practice for anxiety treatment, also disscused potential long term effects of benzos.  She would like to continue the prescription as she's taken it for over 10 years. UDS and controlled substance contract ordered today. Refill will be called into pharmacy. Encouraged her to use this sparingly as needed, she verbalized understanding.

## 2017-07-20 NOTE — Patient Instructions (Signed)
Stop by the lab to complete the urine drug screen and controlled substance contract.  We will see you in May 2019 for your wellness visit.  It was a pleasure meeting you!

## 2017-07-20 NOTE — Assessment & Plan Note (Signed)
Recent mammogram unremarkable. Continue annual screening.

## 2017-07-21 NOTE — Telephone Encounter (Signed)
Requesting: Alprazolam Contract: None UDS: Collected 10.22.2018 ordered by Alma Friendly, NP Last OV: TA-5.22.2018 with KC-10.22.2018 Next OV: with KC-5.29.2018 Last Refill: 8.14.2018  TA-Looks like transitioning care to [facility] but I was unsure if this should still go to you or to her?  Please advise

## 2017-07-21 NOTE — Telephone Encounter (Signed)
Spoken to pharmacy and confirm Rx has been called in.

## 2017-07-21 NOTE — Telephone Encounter (Signed)
Was this called into her pharmacy? Looks like I printed an Rx on 10/22. Will you please take a look?

## 2017-07-23 LAB — PAIN MGMT, PROFILE 8 W/CONF, U
6 Acetylmorphine: NEGATIVE ng/mL (ref <10)
ALCOHOL METABOLITES: NEGATIVE ng/mL (ref <500)
ALPHAHYDROXYALPRAZOLAM: 200 ng/mL — AB (ref <25)
ALPHAHYDROXYMIDAZOLAM: NEGATIVE ng/mL (ref <50)
Alphahydroxytriazolam: NEGATIVE ng/mL (ref <50)
Aminoclonazepam: NEGATIVE ng/mL (ref <25)
Amphetamines: NEGATIVE ng/mL (ref <500)
BENZODIAZEPINES: POSITIVE ng/mL — AB (ref <100)
BUPRENORPHINE, URINE: NEGATIVE ng/mL (ref <5)
COCAINE METABOLITE: NEGATIVE ng/mL (ref <150)
CREATININE: 135.2 mg/dL
Hydroxyethylflurazepam: NEGATIVE ng/mL (ref <50)
LORAZEPAM: NEGATIVE ng/mL (ref <50)
MDMA: NEGATIVE ng/mL (ref <500)
Marijuana Metabolite: NEGATIVE ng/mL (ref <20)
NORDIAZEPAM: NEGATIVE ng/mL (ref <50)
OPIATES: NEGATIVE ng/mL (ref <100)
Oxazepam: NEGATIVE ng/mL (ref <50)
Oxidant: NEGATIVE ug/mL (ref <200)
Oxycodone: NEGATIVE ng/mL (ref <100)
Temazepam: NEGATIVE ng/mL (ref <50)
pH: 6.56 (ref 4.5–9.0)

## 2017-10-22 ENCOUNTER — Other Ambulatory Visit: Payer: Self-pay | Admitting: Primary Care

## 2017-10-22 DIAGNOSIS — F411 Generalized anxiety disorder: Secondary | ICD-10-CM

## 2017-10-22 NOTE — Telephone Encounter (Signed)
Ok to refill? Electronically refill request for ALPRAZolam Duanne Moron) 0.5 MG tablet  Last prescribed on 07/20/2017. Last seen on 07/20/2017

## 2017-10-22 NOTE — Telephone Encounter (Signed)
Okay to phone in, #90, no refills. May want to ask for new pharmacy.

## 2017-10-23 NOTE — Telephone Encounter (Signed)
[facility] skyped me while I was on phone with another pt and pt request cb.

## 2017-10-23 NOTE — Telephone Encounter (Signed)
Message left for patient to return my call.  

## 2017-10-28 NOTE — Telephone Encounter (Signed)
Per [facility]  Tula Nakayama 10/23/2017 03:39 PM Summary: rx question  Patient does not need rx refill because she got them on 10/13/17 with 90 tablets along with two other medications. You can disregard the refill request for her xanax. If any questions feel free to call her back.

## 2017-12-31 ENCOUNTER — Other Ambulatory Visit: Payer: Self-pay | Admitting: Primary Care

## 2017-12-31 DIAGNOSIS — F411 Generalized anxiety disorder: Secondary | ICD-10-CM

## 2018-01-01 NOTE — Telephone Encounter (Signed)
Last filled 10-13-17 #90 Last OV 07-20-18 Next OV 02-24-18

## 2018-01-04 NOTE — Telephone Encounter (Signed)
Pt took her last pill alprazolam today

## 2018-01-04 NOTE — Telephone Encounter (Signed)
Please remind patient of the Rx refill policy, she needs to give [place] more than 1/2 days notice for future refills. No suspicious activity on PMP aware, UDS UTD.

## 2018-01-05 NOTE — Telephone Encounter (Signed)
Message left for patient to return my call.  

## 2018-01-06 NOTE — Telephone Encounter (Signed)
Spoken and notified patient of Kate Clark's comments. Patient verbalized understanding.  

## 2018-02-15 ENCOUNTER — Other Ambulatory Visit: Payer: Self-pay | Admitting: Primary Care

## 2018-02-15 DIAGNOSIS — E782 Mixed hyperlipidemia: Secondary | ICD-10-CM

## 2018-02-15 DIAGNOSIS — I10 Essential (primary) hypertension: Secondary | ICD-10-CM

## 2018-02-15 DIAGNOSIS — E039 Hypothyroidism, unspecified: Secondary | ICD-10-CM

## 2018-02-17 ENCOUNTER — Ambulatory Visit (INDEPENDENT_AMBULATORY_CARE_PROVIDER_SITE_OTHER): Payer: Medicare HMO

## 2018-02-17 VITALS — BP 130/84 | HR 80 | Temp 97.8°F | Ht 65.0 in | Wt 182.8 lb

## 2018-02-17 DIAGNOSIS — Z0000 Encounter for general adult medical examination without abnormal findings: Secondary | ICD-10-CM

## 2018-02-17 DIAGNOSIS — E039 Hypothyroidism, unspecified: Secondary | ICD-10-CM | POA: Diagnosis not present

## 2018-02-17 DIAGNOSIS — E782 Mixed hyperlipidemia: Secondary | ICD-10-CM

## 2018-02-17 DIAGNOSIS — I10 Essential (primary) hypertension: Secondary | ICD-10-CM

## 2018-02-17 LAB — COMPREHENSIVE METABOLIC PANEL
ALK PHOS: 121 U/L — AB (ref 39–117)
ALT: 34 U/L (ref 0–35)
AST: 42 U/L — ABNORMAL HIGH (ref 0–37)
Albumin: 3.7 g/dL (ref 3.5–5.2)
BILIRUBIN TOTAL: 0.7 mg/dL (ref 0.2–1.2)
BUN: 19 mg/dL (ref 6–23)
CO2: 24 meq/L (ref 19–32)
Calcium: 9.5 mg/dL (ref 8.4–10.5)
Chloride: 106 mEq/L (ref 96–112)
Creatinine, Ser: 0.85 mg/dL (ref 0.40–1.20)
GFR: 68.74 mL/min (ref >60.00)
GLUCOSE: 100 mg/dL — AB (ref 70–99)
POTASSIUM: 4 meq/L (ref 3.5–5.1)
Sodium: 139 mEq/L (ref 135–145)
TOTAL PROTEIN: 7.9 g/dL (ref 6.0–8.3)

## 2018-02-17 LAB — LIPID PANEL
CHOL/HDL RATIO: 4
Cholesterol: 190 mg/dL (ref 0–200)
HDL: 47.3 mg/dL (ref >39.00)
LDL Cholesterol: 111 mg/dL — ABNORMAL HIGH (ref 0–99)
NONHDL: 142.27
TRIGLYCERIDES: 154 mg/dL — AB (ref 0.0–149.0)
VLDL: 30.8 mg/dL (ref 0.0–40.0)

## 2018-02-17 LAB — TSH: TSH: 3.22 u[IU]/mL (ref 0.35–4.50)

## 2018-02-17 NOTE — Progress Notes (Signed)
I reviewed health advisor's note, was available for consultation, and agree with documentation and plan.  

## 2018-02-17 NOTE — Progress Notes (Signed)
Subjective:   [name] is a 77 y.o. female who presents for Medicare Annual (Subsequent) preventive examination.  Review of Systems:  N/A Cardiac Risk Factors include: advanced age (>56men, >65 women);dyslipidemia;hypertension;obesity (BMI >30kg/m2)     Objective:     Vitals: BP 130/84 (BP Location: Right Arm, Patient Position: Sitting, Cuff Size: Normal)   Pulse 80   Temp 97.8 F (36.6 C) (Oral)   Ht 5\' 5"  (1.651 m) Comment: no shoes  Wt 182 lb 12 oz (82.9 kg)   SpO2 96%   BMI 30.41 kg/m   Body mass index is 30.41 kg/m.  Advanced Directives 02/17/2018 02/11/2017  Does Patient Have a Medical Advance Directive? Yes Yes  Type of Paramedic of [place];Living will [place];Living will  Copy of [place] in Chart? No - copy requested No - copy requested    Tobacco Social History   Tobacco Use  Smoking Status Former Smoker  . Packs/day: 0.20  . Years: 7.00  . Pack years: 1.40  . Types: Cigarettes  . Last attempt to quit: 09/29/1970  . Years since quitting: 47.4  Smokeless Tobacco Never Used  Tobacco Comment   1 pack per week     Counseling given: No Comment: 1 pack per week   Clinical Intake:  Pre-visit preparation completed: Yes  Pain : No/denies pain Pain Score: 0-No pain     Nutritional Status: BMI > 30  Obese Nutritional Risks: None Diabetes: No  How often do you need to have someone help you when you read instructions, pamphlets, or other written materials from your doctor or pharmacy?: 1 - Never What is the last grade level you completed in school?: 12th grade  Interpreter Needed?: No  Comments: pt lives with spouse Information entered by :: LPinson, LPN  Past Medical History:  Diagnosis Date  . Adenocarcinoma, breast ([place])   . Anxiety   . Colonic polyp    5 mm adenoma 1977  . Degenerative joint disease   . Fibromyalgia   . GERD (gastroesophageal reflux disease)   .  Hemorrhoids   . History of anemia   . History of shingles   . Hyperlipidemia   . Hypertension   . Hypothyroidism   . IBS (irritable bowel syndrome)   . NASH (nonalcoholic steatohepatitis)   . Osteopenia   . Personal history of chemotherapy 2005   Left Breast Cancer  . Personal history of radiation therapy 2005   Left Breast Cancer   Past Surgical History:  Procedure Laterality Date  . ABDOMINAL HYSTERECTOMY    . APPENDECTOMY    . BILATERAL SALPINGOOPHORECTOMY  1978  . BREAST LUMPECTOMY Left 2005   left--node dissection 7/05 by Dr. Charlynne Pander  . LAPAROSCOPIC CHOLECYSTECTOMY    . ROTATOR CUFF REPAIR     left   Family History  Problem Relation Age of Onset  . Prostate cancer Father   . Prostate cancer Brother   . Stroke Mother   . Aneurysm Brother   . Emphysema Sister   . Crohn's disease Sister   . Liver disease Brother   . Hypertension Brother    Social History   Socioeconomic History  . Marital status: Married    Spouse name: [name]  . Number of children: 2  . Years of education: Not on file  . Highest education level: Not on file  Occupational History  . Occupation: retired  Scientific laboratory technician  . Financial resource strain: Not on file  .  Food insecurity:    Worry: Not on file    Inability: Not on file  . Transportation needs:    Medical: Not on file    Non-medical: Not on file  Tobacco Use  . Smoking status: Former Smoker    Packs/day: 0.20    Years: 7.00    Pack years: 1.40    Types: Cigarettes    Last attempt to quit: 09/29/1970    Years since quitting: 47.4  . Smokeless tobacco: Never Used  . Tobacco comment: 1 pack per week  Substance and Sexual Activity  . Alcohol use: Yes    Comment: rarely a glass of wine  . Drug use: No  . Sexual activity: Yes  Lifestyle  . Physical activity:    Days per week: Not on file    Minutes per session: Not on file  . Stress: Not on file  Relationships  . Social connections:    Talks on phone: Not on file    Gets  together: Not on file    Attends religious service: Not on file    Active member of club or organization: Not on file    Attends meetings of clubs or organizations: Not on file    Relationship status: Not on file  Other Topics Concern  . Not on file  Social History Narrative   Would desire CPR    Outpatient Encounter Medications as of 02/17/2018  Medication Sig  . ALPRAZolam (XANAX) 0.5 MG tablet TAKE 1 TABLET BY MOUTH ONCE TO TWICE DAILY AS NEEDED FOR ANXIETY AND SLEEP.  Marland Kitchen amLODipine (NORVASC) 5 MG tablet Take 1 tablet (5 mg total) by mouth daily.  Marland Kitchen aspirin (ADULT ASPIRIN EC LOW STRENGTH) 81 MG EC tablet Take 81 mg by mouth daily.    Marland Kitchen levothyroxine (SYNTHROID, LEVOTHROID) 75 MCG tablet Take 1 tablet (75 mcg total) by mouth daily.  . Multiple Vitamins-Calcium (ONE-A-DAY WOMENS FORMULA PO) Take 1 tablet by mouth daily.  Marland Kitchen omeprazole (PRILOSEC) 40 MG capsule TAKE ONE (1) CAPSULE BY MOUTH EACH DAY   No facility-administered encounter medications on file as of 02/17/2018.     Activities of Daily Living In your present state of health, do you have any difficulty performing the following activities: 02/17/2018  Hearing? Y  Comment HOH in left ear  Vision? N  Difficulty concentrating or making decisions? N  Walking or climbing stairs? N  Dressing or bathing? N  Doing errands, shopping? N  Preparing Food and eating ? N  Using the Toilet? N  In the past six months, have you accidently leaked urine? N  Do you have problems with loss of bowel control? N  Managing your Medications? N  Managing your Finances? N  Housekeeping or managing your Housekeeping? N  Some recent data might be hidden    Patient Care Team: [name], NP as PCP - General (Internal Medicine) Gatha Mayer, MD as Consulting Physician (Gastroenterology) Magrinat, Virgie Dad, MD as Consulting Physician (Oncology) Susa Day, MD as Consulting Physician (Orthopedic Surgery)    Assessment:   This is a  routine wellness examination for [name].   Hearing Screening   125Hz  250Hz  500Hz  1000Hz  2000Hz  3000Hz  4000Hz  6000Hz  8000Hz   Right ear:   40 40 40  40    Left ear:   40 0 0  40      Visual Acuity Screening   Right eye Left eye Both eyes  Without correction: 20/25 20/25 20/25   With correction:  Exercise Activities and Dietary recommendations Current Exercise Habits: The patient does not participate in regular exercise at present, Exercise limited by: None identified  Goals    . Patient Stated     Starting 02/17/2018, I will continue to reduce intake of red meat and eat at least 2 servings of fresh fruits and vegetables daily.        Fall Risk Fall Risk  02/17/2018 02/11/2017 02/13/2016 01/20/2013  Falls in the past year? No No No No   Depression Screen PHQ 2/9 Scores 02/17/2018 02/11/2017 02/13/2016 01/20/2013  PHQ - 2 Score 0 0 0 0  PHQ- 9 Score 0 - - -     Cognitive Function MMSE - Mini Mental State Exam 02/17/2018 02/11/2017  Orientation to time 5 5  Orientation to Place 5 5  Registration 3 3  Attention/ Calculation 0 0  Recall 3 3  Language- name 2 objects 0 0  Language- repeat 1 1  Language- follow 3 step command 3 3  Language- read & follow direction 0 0  Write a sentence 0 0  Copy design 0 0  Total score 20 20     PLEASE NOTE: A Mini-Cog screen was completed. Maximum score is 20. A value of 0 denotes this part of [place] MMSE was not completed or the patient failed this part of the Mini-Cog screening.   Mini-Cog Screening Orientation to Time - Max 5 pts Orientation to Place - Max 5 pts Registration - Max 3 pts Recall - Max 3 pts Language Repeat - Max 1 pts Language Follow 3 Step Command - Max 3 pts     Immunization History  Administered Date(s) Administered  . Tdap 01/15/2011    Screening Tests Health Maintenance  Topic Date Due  . INFLUENZA VACCINE  02/11/2026 (Originally 04/29/2018)  . PNA vac Low Risk Adult (1 of 2 - PCV13) 02/11/2026 (Originally  01/29/2005)  . TETANUS/TDAP  01/14/2021  . DEXA SCAN  Completed      Plan:     I have personally reviewed, addressed, and noted the following in the patient's chart:  A. Medical and social history B. Use of alcohol, tobacco or illicit drugs  C. Current medications and supplements D. Functional ability and status E.  Nutritional status F.  Physical activity G. Advance directives H. List of other physicians I.  Hospitalizations, surgeries, and ER visits in previous 12 months J.  Miller to include hearing, vision, cognitive, depression L. Referrals and appointments - none  In addition, I have reviewed and discussed with patient certain preventive protocols, quality metrics, and best practice recommendations. A written personalized care plan for preventive services as well as general preventive health recommendations were provided to patient.  See attached scanned questionnaire for additional information.   Signed,   Lindell Noe, MHA, BS, LPN Health Coach

## 2018-02-17 NOTE — Progress Notes (Signed)
PCP notes:   Health maintenance:  No gaps identified.   Abnormal screenings:   Hearing - failed  Hearing Screening   125Hz  250Hz  500Hz  1000Hz  2000Hz  3000Hz  4000Hz  6000Hz  8000Hz   Right ear:   40 40 40  40    Left ear:   40 0 0  40     Patient concerns:   None  Nurse concerns:  None  Next PCP appt:   02/24/18 @ 1100

## 2018-02-17 NOTE — Patient Instructions (Signed)
[name] , Thank you for taking time to come for your Medicare Wellness Visit. I appreciate your ongoing commitment to your health goals. Please review the following plan we discussed and let me know if I can assist you in the future.   These are the goals we discussed: Goals    . Patient Stated     Starting 02/17/2018, I will continue to reduce intake of red meat and eat at least 2 servings of fresh fruits and vegetables daily.        This is a list of the screening recommended for you and due dates:  Health Maintenance  Topic Date Due  . Flu Shot  02/11/2026*  . Pneumonia vaccines (1 of 2 - PCV13) 02/11/2026*  . Tetanus Vaccine  01/14/2021  . DEXA scan (bone density measurement)  Completed  *Topic was postponed. The date shown is not the original due date.   Preventive Care for Adults  A healthy lifestyle and preventive care can promote health and wellness. Preventive health guidelines for adults include the following key practices.  . A routine yearly physical is a good way to check with your health care provider about your health and preventive screening. It is a chance to share any concerns and updates on your health and to receive a thorough exam.  . Visit your dentist for a routine exam and preventive care every 6 months. Brush your teeth twice a day and floss once a day. Good oral hygiene prevents tooth decay and gum disease.  . The frequency of eye exams is based on your age, health, family medical history, use  of contact lenses, and other factors. Follow your health care provider's recommendations for frequency of eye exams.  . Eat a healthy diet. Foods like vegetables, fruits, whole grains, low-fat dairy products, and lean protein foods contain the nutrients you need without too many calories. Decrease your intake of foods high in solid fats, added sugars, and salt. Eat the right amount of calories for you. Get information about a proper diet from your health care  provider, if necessary.  . Regular physical exercise is one of the most important things you can do for your health. Most adults should get at least 150 minutes of moderate-intensity exercise (any activity that increases your heart rate and causes you to sweat) each week. In addition, most adults need muscle-strengthening exercises on 2 or more days a week.  Silver Sneakers may be a benefit available to you. To determine eligibility, you may visit the website: www.silversneakers.com or contact program at (209)627-3236 Mon-Fri between 8AM-8PM.   . Maintain a healthy weight. The body mass index (BMI) is a screening tool to identify possible weight problems. It provides an estimate of body fat based on height and weight. Your health care provider can find your BMI and can help you achieve or maintain a healthy weight.   For adults 20 years and older: ? A BMI below 18.5 is considered underweight. ? A BMI of 18.5 to 24.9 is normal. ? A BMI of 25 to 29.9 is considered overweight. ? A BMI of 30 and above is considered obese.   . Maintain normal blood lipids and cholesterol levels by exercising and minimizing your intake of saturated fat. Eat a balanced diet with plenty of fruit and vegetables. Blood tests for lipids and cholesterol should begin at age 41 and be repeated every 5 years. If your lipid or cholesterol levels are high, you are over 50, or you are  at high risk for heart disease, you may need your cholesterol levels checked more frequently. Ongoing high lipid and cholesterol levels should be treated with medicines if diet and exercise are not working.  . If you smoke, find out from your health care provider how to quit. If you do not use tobacco, please do not start.  . If you choose to drink alcohol, please do not consume more than 2 drinks per day. One drink is considered to be 12 ounces (355 mL) of beer, 5 ounces (148 mL) of wine, or 1.5 ounces (44 mL) of liquor.  . If you are 24-79 years  old, ask your health care provider if you should take aspirin to prevent strokes.  . Use sunscreen. Apply sunscreen liberally and repeatedly throughout the day. You should seek shade when your shadow is shorter than you. Protect yourself by wearing long sleeves, pants, a wide-brimmed hat, and sunglasses year round, whenever you are outdoors.  . Once a month, do a whole body skin exam, using a mirror to look at the skin on your back. Tell your health care provider of new moles, moles that have irregular borders, moles that are larger than a pencil eraser, or moles that have changed in shape or color.

## 2018-02-24 ENCOUNTER — Encounter: Payer: Self-pay | Admitting: Primary Care

## 2018-02-24 ENCOUNTER — Ambulatory Visit: Payer: Medicare HMO | Admitting: Family Medicine

## 2018-02-24 ENCOUNTER — Ambulatory Visit (INDEPENDENT_AMBULATORY_CARE_PROVIDER_SITE_OTHER): Payer: Medicare HMO | Admitting: Primary Care

## 2018-02-24 VITALS — BP 126/78 | HR 84 | Temp 98.0°F | Ht 65.0 in | Wt 185.8 lb

## 2018-02-24 DIAGNOSIS — K219 Gastro-esophageal reflux disease without esophagitis: Secondary | ICD-10-CM

## 2018-02-24 DIAGNOSIS — I10 Essential (primary) hypertension: Secondary | ICD-10-CM | POA: Diagnosis not present

## 2018-02-24 DIAGNOSIS — K760 Fatty (change of) liver, not elsewhere classified: Secondary | ICD-10-CM | POA: Diagnosis not present

## 2018-02-24 DIAGNOSIS — R69 Illness, unspecified: Secondary | ICD-10-CM | POA: Diagnosis not present

## 2018-02-24 DIAGNOSIS — F411 Generalized anxiety disorder: Secondary | ICD-10-CM | POA: Diagnosis not present

## 2018-02-24 DIAGNOSIS — G8929 Other chronic pain: Secondary | ICD-10-CM | POA: Diagnosis not present

## 2018-02-24 DIAGNOSIS — E038 Other specified hypothyroidism: Secondary | ICD-10-CM

## 2018-02-24 DIAGNOSIS — M5440 Lumbago with sciatica, unspecified side: Secondary | ICD-10-CM | POA: Diagnosis not present

## 2018-02-24 DIAGNOSIS — E2839 Other primary ovarian failure: Secondary | ICD-10-CM | POA: Diagnosis not present

## 2018-02-24 DIAGNOSIS — E785 Hyperlipidemia, unspecified: Secondary | ICD-10-CM | POA: Diagnosis not present

## 2018-02-24 DIAGNOSIS — Z1239 Encounter for other screening for malignant neoplasm of breast: Secondary | ICD-10-CM

## 2018-02-24 DIAGNOSIS — Z0000 Encounter for general adult medical examination without abnormal findings: Secondary | ICD-10-CM | POA: Diagnosis not present

## 2018-02-24 DIAGNOSIS — Z1231 Encounter for screening mammogram for malignant neoplasm of breast: Secondary | ICD-10-CM

## 2018-02-24 NOTE — Assessment & Plan Note (Signed)
Slight elevation in Alk Phos and AST, will continue to monitor.   Discussed the importance of a healthy diet and regular exercise in order for weight loss, and to reduce the risk of any potential medical problems.

## 2018-02-24 NOTE — Assessment & Plan Note (Signed)
Stable in the office today, also with home readings. Continue Amlodipine 5 mg.

## 2018-02-24 NOTE — Assessment & Plan Note (Signed)
Compliant to levothyroxine, is taking with all of her other medications and with PPI. Discussed to start taking on an empty stomach with water only. Also discussed to take omeprazole 4 hours after levothyroxine. Recent TSH normal.

## 2018-02-24 NOTE — Patient Instructions (Addendum)
You should be taking levothyroxine on an empty stomach with water only. Do no eat or take any medications within 30 minutes. Your omeprazole must be taken 4 hours after levothyroxine.   It's important to improve your diet by increasing consumption of fresh vegetables and fruits, whole grains, lean protein  Ensure you are drinking 64 ounces of water daily.  Start exercising. You should be getting 150 minutes of exercise weekly.  Schedule your bone density and mammogram for September 2019.  Please consider the Shingles and Pneumonia vaccinations.   Schedule a lab only appointment in October 2019 for your urine drug screen.   Follow up in 1 year for your annual exam or sooner if needed.  It was a pleasure to see you today!   Preventive Care 78 Years and Older, Female Preventive care refers to lifestyle choices and visits with your health care provider that can promote health and wellness. What does preventive care include?  A yearly physical exam. This is also called an annual well check.  Dental exams once or twice a year.  Routine eye exams. Ask your health care provider how often you should have your eyes checked.  Personal lifestyle choices, including: ? Daily care of your teeth and gums. ? Regular physical activity. ? Eating a healthy diet. ? Avoiding tobacco and drug use. ? Limiting alcohol use. ? Practicing safe sex. ? Taking low-dose aspirin every day. ? Taking vitamin and mineral supplements as recommended by your health care provider. What happens during an annual well check? The services and screenings done by your health care provider during your annual well check will depend on your age, overall health, lifestyle risk factors, and family history of disease. Counseling Your health care provider may ask you questions about your:  Alcohol use.  Tobacco use.  Drug use.  Emotional well-being.  Home and relationship well-being.  Sexual activity.  Eating  habits.  History of falls.  Memory and ability to understand (cognition).  Work and work Statistician.  Reproductive health.  Screening You may have the following tests or measurements:  Height, weight, and BMI.  Blood pressure.  Lipid and cholesterol levels. These may be checked every 5 years, or more frequently if you are over 11 years old.  Skin check.  Lung cancer screening. You may have this screening every year starting at age 56 if you have a 30-pack-year history of smoking and currently smoke or have quit within the past 15 years.  Fecal occult blood test (FOBT) of the stool. You may have this test every year starting at age 54.  Flexible sigmoidoscopy or colonoscopy. You may have a sigmoidoscopy every 5 years or a colonoscopy every 10 years starting at age 41.  Hepatitis C blood test.  Hepatitis B blood test.  Sexually transmitted disease (STD) testing.  Diabetes screening. This is done by checking your blood sugar (glucose) after you have not eaten for a while (fasting). You may have this done every 1-3 years.  Bone density scan. This is done to screen for osteoporosis. You may have this done starting at age 43.  Mammogram. This may be done every 1-2 years. Talk to your health care provider about how often you should have regular mammograms.  Talk with your health care provider about your test results, treatment options, and if necessary, the need for more tests. Vaccines Your health care provider may recommend certain vaccines, such as:  Influenza vaccine. This is recommended every year.  Tetanus, diphtheria, and acellular  pertussis (Tdap, Td) vaccine. You may need a Td booster every 10 years.  Varicella vaccine. You may need this if you have not been vaccinated.  Zoster vaccine. You may need this after age 78.  Measles, mumps, and rubella (MMR) vaccine. You may need at least one dose of MMR if you were born in 1957 or later. You may also need a second  dose.  Pneumococcal 13-valent conjugate (PCV13) vaccine. One dose is recommended after age 78.  Pneumococcal polysaccharide (PPSV23) vaccine. One dose is recommended after age 88.  Meningococcal vaccine. You may need this if you have certain conditions.  Hepatitis A vaccine. You may need this if you have certain conditions or if you travel or work in places where you may be exposed to hepatitis A.  Hepatitis B vaccine. You may need this if you have certain conditions or if you travel or work in places where you may be exposed to hepatitis B.  Haemophilus influenzae type b (Hib) vaccine. You may need this if you have certain conditions.  Talk to your health care provider about which screenings and vaccines you need and how often you need them. This information is not intended to replace advice given to you by your health care provider. Make sure you discuss any questions you have with your health care provider. Document Released: 10/12/2015 Document Revised: 06/04/2016 Document Reviewed: 07/17/2015 Elsevier Interactive Patient Education  [employer].

## 2018-02-24 NOTE — Assessment & Plan Note (Addendum)
Taking alprazolam 0.5 mg every morning, sometimes takes 1/2 tablet at bedtime. Discouraged use of daily alprazolam, discussed potential long term effects.   Will continue as she's been taking this daily for 10+ years. UDS is due in October 2019.

## 2018-02-24 NOTE — Assessment & Plan Note (Signed)
Overall stable, manges with stretching and regular activity.

## 2018-02-24 NOTE — Assessment & Plan Note (Signed)
Lipids overall stable.  Recommended to work on diet and exercise.  Repeat in 1 year.

## 2018-02-24 NOTE — Assessment & Plan Note (Signed)
Due for repeat screening mammogram in September, orders placed.

## 2018-02-24 NOTE — Progress Notes (Signed)
Subjective:    Patient ID: [name], female    DOB: 1941/05/09, 77 y.o.   MRN: [id number]  HPI  [name] is a 77 year old female who presents today for complete physical.  Immunizations: -Tetanus: Completed in 2012 -Influenza: Did not complete last season -Pneumonia: Declines -Shingles: Declines   Diet: She endorses a fair diet. Breakfast: Cereal Lunch: Skips Dinner: Vegetables, fruit, chicken, starch Snacks: Occasionally, crackers Desserts: Once every 2-3 weeks Beverages: Water, Coke, un-sweet tea, orange juice  Exercise: She is active in the yard, no regular exercise  Eye exam: No recent exam Dental exam: No recent exam Colonoscopy: Completed in 2011, due in 2021 Dexa: Due Pap Smear: Hysterectomy  Mammogram: Completed in September 2018  BP Readings from Last 3 Encounters:  02/24/18 126/78  02/17/18 130/84  07/20/17 128/78     Review of Systems  Constitutional: Negative for unexpected weight change.  HENT: Negative for rhinorrhea.   Respiratory: Negative for cough and shortness of breath.   Cardiovascular: Negative for chest pain.  Gastrointestinal: Negative for constipation and diarrhea.  Genitourinary: Negative for difficulty urinating.  Musculoskeletal: Negative for myalgias.  Skin: Negative for rash.  Allergic/Immunologic: Negative for environmental allergies.  Neurological: Negative for dizziness, numbness and headaches.  Psychiatric/Behavioral:       Taking alprazolam daily, feels well controlled.       Past Medical History:  Diagnosis Date  . Adenocarcinoma, breast ([place])   . Anxiety   . Colonic polyp    5 mm adenoma 1977  . Degenerative joint disease   . Fibromyalgia   . GERD (gastroesophageal reflux disease)   . Hemorrhoids   . History of anemia   . History of shingles   . Hyperlipidemia   . Hypertension   . Hypothyroidism   . IBS (irritable bowel syndrome)   . NASH (nonalcoholic steatohepatitis)   . Osteopenia   .  Personal history of chemotherapy 2005   Left Breast Cancer  . Personal history of radiation therapy 2005   Left Breast Cancer     Social History   Socioeconomic History  . Marital status: Married    Spouse name: [name]  . Number of children: 2  . Years of education: Not on file  . Highest education level: Not on file  Occupational History  . Occupation: retired  Scientific laboratory technician  . Financial resource strain: Not on file  . Food insecurity:    Worry: Not on file    Inability: Not on file  . Transportation needs:    Medical: Not on file    Non-medical: Not on file  Tobacco Use  . Smoking status: Former Smoker    Packs/day: 0.20    Years: 7.00    Pack years: 1.40    Types: Cigarettes    Last attempt to quit: 09/29/1970    Years since quitting: 47.4  . Smokeless tobacco: Never Used  . Tobacco comment: 1 pack per week  Substance and Sexual Activity  . Alcohol use: Yes    Comment: rarely a glass of wine  . Drug use: No  . Sexual activity: Yes  Lifestyle  . Physical activity:    Days per week: Not on file    Minutes per session: Not on file  . Stress: Not on file  Relationships  . Social connections:    Talks on phone: Not on file    Gets together: Not on file    Attends religious service: Not on file  Active member of club or organization: Not on file    Attends meetings of clubs or organizations: Not on file    Relationship status: Not on file  . Intimate partner violence:    Fear of current or ex partner: Not on file    Emotionally abused: Not on file    Physically abused: Not on file    Forced sexual activity: Not on file  Other Topics Concern  . Not on file  Social History Narrative   Would desire CPR    Past Surgical History:  Procedure Laterality Date  . ABDOMINAL HYSTERECTOMY    . APPENDECTOMY    . BILATERAL SALPINGOOPHORECTOMY  1978  . BREAST LUMPECTOMY Left 2005   left--node dissection 7/05 by Dr. Charlynne Pander  . LAPAROSCOPIC CHOLECYSTECTOMY    .  ROTATOR CUFF REPAIR     left    Family History  Problem Relation Age of Onset  . Prostate cancer Father   . Prostate cancer Brother   . Stroke Mother   . Aneurysm Brother   . Emphysema Sister   . Crohn's disease Sister   . Liver disease Brother   . Hypertension Brother     Allergies  Allergen Reactions  . Ciprofloxacin Diarrhea  . Codeine     REACTION: nausea and vomiting    Current Outpatient Medications on File Prior to Visit  Medication Sig Dispense Refill  . ALPRAZolam (XANAX) 0.5 MG tablet TAKE 1 TABLET BY MOUTH ONCE TO TWICE DAILY AS NEEDED FOR ANXIETY AND SLEEP. 90 tablet 0  . amLODipine (NORVASC) 5 MG tablet Take 1 tablet (5 mg total) by mouth daily. 90 tablet 3  . aspirin (ADULT ASPIRIN EC LOW STRENGTH) 81 MG EC tablet Take 81 mg by mouth daily.      Marland Kitchen levothyroxine (SYNTHROID, LEVOTHROID) 75 MCG tablet Take 1 tablet (75 mcg total) by mouth daily. 90 tablet 3  . Multiple Vitamins-Calcium (ONE-A-DAY WOMENS FORMULA PO) Take 1 tablet by mouth daily.    Marland Kitchen omeprazole (PRILOSEC) 40 MG capsule TAKE ONE (1) CAPSULE BY MOUTH EACH DAY 90 capsule 2   No current facility-administered medications on file prior to visit.     BP 126/78   Pulse 84   Temp 98 F (36.7 C) (Oral)   Ht 5\' 5"  (1.651 m)   Wt 185 lb 12 oz (84.3 kg)   SpO2 98%   BMI 30.91 kg/m    Objective:   Physical Exam  Constitutional: She is oriented to person, place, and time. She appears well-nourished.  HENT:  Mouth/Throat: No oropharyngeal exudate.  Eyes: Pupils are equal, round, and reactive to light. EOM are normal.  Neck: Neck supple. No thyromegaly present.  Cardiovascular: Normal rate and regular rhythm.  Respiratory: Effort normal and breath sounds normal.  GI: Soft. Bowel sounds are normal. There is no tenderness.  Musculoskeletal: Normal range of motion.  Neurological: She is alert and oriented to person, place, and time.  Skin: Skin is warm and dry.  Psychiatric: She has a normal mood and  affect.           Assessment & Plan:

## 2018-02-24 NOTE — Assessment & Plan Note (Signed)
Declines Shingles and Pneumonia vaccinations. Mammogram due in September 2019. Bone density due, pending. Colonoscopy due in 2021. Discussed the importance of a healthy diet and regular exercise in order for weight loss, and to reduce the risk of any potential medical problems. Exam unremarkable. Labs stable. Follow up in 1 year.

## 2018-02-24 NOTE — Assessment & Plan Note (Signed)
Stable on omeprazole 40 mg, cannot tolerate reduced dose. Continue same. Bone density scan pending.

## 2018-02-25 ENCOUNTER — Other Ambulatory Visit: Payer: Self-pay | Admitting: Primary Care

## 2018-02-25 ENCOUNTER — Other Ambulatory Visit: Payer: Self-pay | Admitting: *Deleted

## 2018-02-25 ENCOUNTER — Encounter: Payer: Self-pay | Admitting: *Deleted

## 2018-02-25 DIAGNOSIS — F411 Generalized anxiety disorder: Secondary | ICD-10-CM

## 2018-02-25 DIAGNOSIS — E039 Hypothyroidism, unspecified: Secondary | ICD-10-CM

## 2018-02-25 DIAGNOSIS — I10 Essential (primary) hypertension: Secondary | ICD-10-CM

## 2018-02-25 MED ORDER — AMLODIPINE BESYLATE 5 MG PO TABS: 5.0000 mg | ORAL_TABLET | Freq: Every day | ORAL | 3 refills | Status: DC

## 2018-02-25 MED ORDER — LEVOTHYROXINE SODIUM 75 MCG PO TABS: 75.0000 ug | ORAL_TABLET | Freq: Every day | ORAL | 3 refills | Status: DC

## 2018-02-25 NOTE — Telephone Encounter (Signed)
Copied from [place] [id number]. Topic: General - Other >> Feb 25, 2018 11:07 AM Oneta Rack wrote: Relation to pt: self Call back number:[phone] Pharmacy: [id number] [place], [place] [phone] ([phone] (Fax)   Reason for call:  Patient last seen 02/24/18 for her CPE and states she would like a copy of her labs mailed. Patient requesting all her medications be refilled for 1 year, please advise  >> Feb 25, 2018 11:10 AM Oneta Rack wrote: Relation to pt: self Call back number:[phone] Pharmacy: [id number] - [place], [place] [phone] (Phone) [phone] (Fax)   Reason for call:  Patient last seen 02/24/18 for her CPE and states she would like a copy of her labs mailed. Patient requesting all her medications be refilled for 1 year, please advise

## 2018-02-25 NOTE — Telephone Encounter (Signed)
Last office visit 02/24/2018.  Last refilled 01/04/2018 for #90 with no refills.  Ok to refill?

## 2018-02-25 NOTE — Telephone Encounter (Signed)
UDS is due in October. No suspicious activity on PMP aware.  Refill sent to pharmacy.

## 2018-02-25 NOTE — Telephone Encounter (Signed)
Sending a copy of labs for patient. Looks like these medications were last filled by Dr Deborra Medina and were at [facility].

## 2018-02-25 NOTE — Telephone Encounter (Signed)
Refills sent to pharmacy. 

## 2018-04-12 ENCOUNTER — Other Ambulatory Visit: Payer: Self-pay | Admitting: Family Medicine

## 2018-04-15 ENCOUNTER — Other Ambulatory Visit: Payer: Self-pay | Admitting: Family Medicine

## 2018-04-15 DIAGNOSIS — K219 Gastro-esophageal reflux disease without esophagitis: Secondary | ICD-10-CM

## 2018-04-15 NOTE — Telephone Encounter (Signed)
KC-Plz see refill req/thx dmf 

## 2018-04-16 NOTE — Telephone Encounter (Signed)
Noted, refills sent to pharmacy. 

## 2018-05-17 ENCOUNTER — Encounter: Payer: Self-pay | Admitting: Internal Medicine

## 2018-05-17 ENCOUNTER — Ambulatory Visit (INDEPENDENT_AMBULATORY_CARE_PROVIDER_SITE_OTHER): Payer: Medicare HMO | Admitting: Internal Medicine

## 2018-05-17 ENCOUNTER — Ambulatory Visit: Payer: Self-pay | Admitting: Primary Care

## 2018-05-17 VITALS — BP 140/82 | HR 81 | Temp 98.0°F | Resp 20 | Ht 65.0 in | Wt 182.2 lb

## 2018-05-17 DIAGNOSIS — L280 Lichen simplex chronicus: Secondary | ICD-10-CM | POA: Diagnosis not present

## 2018-05-17 DIAGNOSIS — J019 Acute sinusitis, unspecified: Secondary | ICD-10-CM | POA: Insufficient documentation

## 2018-05-17 DIAGNOSIS — J0100 Acute maxillary sinusitis, unspecified: Secondary | ICD-10-CM

## 2018-05-17 HISTORY — DX: Acute sinusitis, unspecified: J01.90

## 2018-05-17 HISTORY — DX: Acute maxillary sinusitis, unspecified: J01.00

## 2018-05-17 MED ORDER — TRIAMCINOLONE ACETONIDE 0.1 % EX CREA: 1.0000 "application " | TOPICAL_CREAM | Freq: Two times a day (BID) | CUTANEOUS | 1 refills | Status: DC | PRN

## 2018-05-17 MED ORDER — AMOXICILLIN-POT CLAVULANATE 875-125 MG PO TABS: 1.0000 | ORAL_TABLET | Freq: Two times a day (BID) | ORAL | 0 refills | Status: DC

## 2018-05-17 NOTE — Progress Notes (Signed)
Subjective:    Patient ID: [name], female    DOB: 1941-09-03, 77 y.o.   MRN: [id number]  HPI Here due to rash and cough  2 weeks ago--started with cough (like a cold) Some yellow mucus Seemed to improve last week Taking coricidin syrup  Mucus seems to have gone away but the cough has continued Daughters worried about pneumonia No fever Has awoken hot and slight sweat around neck--no chills No sig SOB--but feels "wore out from the cough" Lots of sinus drainage--day and worse in AM Chronic nasal bleeding also  Rash on back started about 4 days ago Right flank and around under breast Feels itchy---blister like sore 2 lesions on left arm and around the left back  Current Outpatient Medications on File Prior to Visit  Medication Sig Dispense Refill  . ALPRAZolam (XANAX) 0.5 MG tablet Take 1 tablet by mouth once to twice daily, use sparingly. 90 tablet 0  . amLODipine (NORVASC) 5 MG tablet Take 1 tablet (5 mg total) by mouth daily. 90 tablet 3  . aspirin (ADULT ASPIRIN EC LOW STRENGTH) 81 MG EC tablet Take 81 mg by mouth daily.      Marland Kitchen levothyroxine (SYNTHROID, LEVOTHROID) 75 MCG tablet Take 1 tablet (75 mcg total) by mouth daily. 90 tablet 3  . Multiple Vitamins-Calcium (ONE-A-DAY WOMENS FORMULA PO) Take 1 tablet by mouth daily.    Marland Kitchen omeprazole (PRILOSEC) 40 MG capsule TAKE ONE CAPSULE BY MOUTH DAILY 90 capsule 2   No current facility-administered medications on file prior to visit.     Allergies  Allergen Reactions  . Ciprofloxacin Diarrhea  . Codeine     REACTION: nausea and vomiting    Past Medical History:  Diagnosis Date  . Adenocarcinoma, breast ([place])   . Anxiety   . Colonic polyp    5 mm adenoma 1977  . Degenerative joint disease   . Fibromyalgia   . GERD (gastroesophageal reflux disease)   . Hemorrhoids   . History of anemia   . History of shingles   . Hyperlipidemia   . Hypertension   . Hypothyroidism   . IBS (irritable bowel syndrome)   .  NASH (nonalcoholic steatohepatitis)   . Osteopenia   . Personal history of chemotherapy 2005   Left Breast Cancer  . Personal history of radiation therapy 2005   Left Breast Cancer    Past Surgical History:  Procedure Laterality Date  . ABDOMINAL HYSTERECTOMY    . APPENDECTOMY    . BILATERAL SALPINGOOPHORECTOMY  1978  . BREAST LUMPECTOMY Left 2005   left--node dissection 7/05 by Dr. Charlynne Pander  . LAPAROSCOPIC CHOLECYSTECTOMY    . ROTATOR CUFF REPAIR     left    Family History  Problem Relation Age of Onset  . Prostate cancer Father   . Prostate cancer Brother   . Stroke Mother   . Aneurysm Brother   . Emphysema Sister   . Crohn's disease Sister   . Liver disease Brother   . Hypertension Brother     Social History   Socioeconomic History  . Marital status: Married    Spouse name: [name]  . Number of children: 2  . Years of education: Not on file  . Highest education level: Not on file  Occupational History  . Occupation: retired  Scientific laboratory technician  . Financial resource strain: Not on file  . Food insecurity:    Worry: Not on file    Inability: Not on file  .  Transportation needs:    Medical: Not on file    Non-medical: Not on file  Tobacco Use  . Smoking status: Former Smoker    Packs/day: 0.20    Years: 7.00    Pack years: 1.40    Types: Cigarettes    Last attempt to quit: 09/29/1970    Years since quitting: 47.6  . Smokeless tobacco: Never Used  . Tobacco comment: 1 pack per week  Substance and Sexual Activity  . Alcohol use: Yes    Comment: rarely a glass of wine  . Drug use: No  . Sexual activity: Yes  Lifestyle  . Physical activity:    Days per week: Not on file    Minutes per session: Not on file  . Stress: Not on file  Relationships  . Social connections:    Talks on phone: Not on file    Gets together: Not on file    Attends religious service: Not on file    Active member of club or organization: Not on file    Attends meetings of clubs or  organizations: Not on file    Relationship status: Not on file  . Intimate partner violence:    Fear of current or ex partner: Not on file    Emotionally abused: Not on file    Physically abused: Not on file    Forced sexual activity: Not on file  Other Topics Concern  . Not on file  Social History Narrative   Would desire CPR   Review of Systems No vomiting or diarrhea Appetite is okay    Objective:   Physical Exam  Constitutional: She appears well-developed. No distress.  HENT:  Mouth/Throat: Oropharynx is clear and moist. No oropharyngeal exudate.  No sinus tenderness Marked nasal inflammation with bleeding spots bilaterally TMs normal   Neck: No thyromegaly present.  Respiratory: Effort normal and breath sounds normal. No respiratory distress. She has no wheezes. She has no rales.  No dullness  Lymphadenopathy:    She has no cervical adenopathy.  Skin:  Multiple excoriated areas---right upper back and upper lumbar area, some on left lower back and 2 on left arm           Assessment & Plan:

## 2018-05-17 NOTE — Assessment & Plan Note (Signed)
Persistent infection for over 2 weeks---cough, drainage No findings in lungs---no x-ray available (will order if breathing gets worse) Rx with augmentin Analgesics--tylenol

## 2018-05-17 NOTE — Assessment & Plan Note (Signed)
No known bug bite exposure (but was at beach in rental) Will try TAC Consider scabies if continues to spread

## 2018-05-17 NOTE — Telephone Encounter (Signed)
Will evaluate at the [facility] 

## 2018-05-17 NOTE — Telephone Encounter (Signed)
Patient states she is suffering with rash that occurred on Friday- it has since spread and is itching. Patient states she does she little blisters that scab over. She has itching. She reports the pain was present at the beginning - but not now. Patient thought she had gotten bitten by something- but she may have a reoccurrence of shingles. Appointment scheduled.  Reason for Disposition . Rash looks like large or small blisters (i.e., fluid filled bubbles or sacs on the skin)  Answer Assessment - Initial Assessment Questions 1. APPEARANCE of RASH: "Describe the rash." (e.g., spots, blisters, raised areas, skin peeling, scaly)     Red spots 2. SIZE: "How big are the spots?" (e.g., tip of pen, eraser, coin; inches, centimeters)     3 areas on back- different sizes- some small and some are bigger 3. LOCATION: "Where is the rash located?"     Back, chest, left arm, legs- inside thighs  4. COLOR: "What color is the rash?" (Note: It is difficult to assess rash color in people with darker-colored skin. When this situation occurs, simply ask the caller to describe what they see.)     red 5. ONSET: "When did the rash begin?"     Friday 6. FEVER: "Do you have a fever?" If so, ask: "What is your temperature, how was it measured, and when did it start?"     no 7. ITCHING: "Does the rash itch?" If so, ask: "How bad is the itch?" (Scale 1-10; or mild, moderate, severe)     Yes- 8 8. CAUSE: "What do you think is causing the rash?"     Patient thought might be spider bite- but now she thinks it could be shingles 9. MEDICATION FACTORS: "Have you started any new medications within the last 2 weeks?" (e.g., antibiotics)      Cough medication-OTC 10. OTHER SYMPTOMS: "Do you have any other symptoms?" (e.g., dizziness, headache, sore throat, joint pain)       no 11. PREGNANCY: "Is there any chance you are pregnant?" "When was your last menstrual period?"       n/a  Protocols used: RASH OR REDNESS -  [facility]

## 2018-05-17 NOTE — Telephone Encounter (Signed)
Pt has appt with Dr Silvio Pate 05/17/18 at 4:30.

## 2018-06-01 ENCOUNTER — Telehealth: Payer: Self-pay

## 2018-06-01 ENCOUNTER — Encounter: Payer: Self-pay | Admitting: Family Medicine

## 2018-06-01 ENCOUNTER — Other Ambulatory Visit: Payer: Self-pay | Admitting: Primary Care

## 2018-06-01 ENCOUNTER — Ambulatory Visit (INDEPENDENT_AMBULATORY_CARE_PROVIDER_SITE_OTHER): Payer: Medicare HMO | Admitting: Family Medicine

## 2018-06-01 VITALS — BP 138/78 | HR 81 | Temp 97.8°F | Ht 65.0 in | Wt 180.8 lb

## 2018-06-01 DIAGNOSIS — N3000 Acute cystitis without hematuria: Secondary | ICD-10-CM | POA: Diagnosis not present

## 2018-06-01 DIAGNOSIS — F411 Generalized anxiety disorder: Secondary | ICD-10-CM

## 2018-06-01 DIAGNOSIS — R300 Dysuria: Secondary | ICD-10-CM

## 2018-06-01 LAB — POC URINALSYSI DIPSTICK (AUTOMATED)
Blood, UA: NEGATIVE
Glucose, UA: NEGATIVE
Nitrite, UA: POSITIVE
Protein, UA: POSITIVE — AB
Spec Grav, UA: 1.025 (ref 1.010–1.025)
Urobilinogen, UA: 2 E.U./dL — AB
pH, UA: 6 (ref 5.0–8.0)

## 2018-06-01 MED ORDER — CEPHALEXIN 500 MG PO CAPS: 500.0000 mg | ORAL_CAPSULE | Freq: Two times a day (BID) | ORAL | 0 refills | Status: DC

## 2018-06-01 NOTE — Assessment & Plan Note (Addendum)
UA suspicious for infection. Not enough sample to do microscopy - but sent off for culture. Will Rx keflex 500mg  BID x 1 wk and await culture results.  Discussed azo use. Update if not improving with treatment. Pt agrees with plan.

## 2018-06-01 NOTE — Progress Notes (Signed)
BP 138/78 (BP Location: Left Arm, Patient Position: Sitting, Cuff Size: Normal)   Pulse 81   Temp 97.8 F (36.6 C) (Oral)   Ht 5\' 5"  (1.651 m)   Wt 180 lb 12 oz (82 kg)   SpO2 97%   BMI 30.08 kg/m    CC: UTI? Subjective:    Patient ID: [name], female    DOB: December 27, 1940, 77 y.o.   MRN: [id number]  HPI: [name] is a 77 y.o. female presenting on 06/01/2018 for Dysuria (C/o burning and pain with urination and urinary frequency. Sxs started on 05/28/18. Tried AZO. )   5d h/o dysuria, suprapubic pressure, frequency and urgency.  Treating with azo and drinking plenty of water.  No hematuria, fevers/chills, nausea, back or flank pain.   Recently completed augmentin course for sinusitis.  Denies vaginal discharge or rash.  No recent UTI.   Relevant past medical, surgical, family and social history reviewed and updated as indicated. Interim medical history since our last visit reviewed. Allergies and medications reviewed and updated. Outpatient Medications Prior to Visit  Medication Sig Dispense Refill  . ALPRAZolam (XANAX) 0.5 MG tablet Take 1 tablet by mouth once to twice daily, use sparingly. 90 tablet 0  . amLODipine (NORVASC) 5 MG tablet Take 1 tablet (5 mg total) by mouth daily. 90 tablet 3  . aspirin (ADULT ASPIRIN EC LOW STRENGTH) 81 MG EC tablet Take 81 mg by mouth daily.      Marland Kitchen levothyroxine (SYNTHROID, LEVOTHROID) 75 MCG tablet Take 1 tablet (75 mcg total) by mouth daily. 90 tablet 3  . Multiple Vitamins-Calcium (ONE-A-DAY WOMENS FORMULA PO) Take 1 tablet by mouth daily.    Marland Kitchen omeprazole (PRILOSEC) 40 MG capsule TAKE ONE CAPSULE BY MOUTH DAILY 90 capsule 2  . amoxicillin-clavulanate (AUGMENTIN) 875-125 MG tablet Take 1 tablet by mouth 2 (two) times daily. 14 tablet 0  . triamcinolone cream (KENALOG) 0.1 % Apply 1 application topically 2 (two) times daily as needed. 45 g 1   No facility-administered medications prior to visit.      Per HPI unless  specifically indicated in ROS section below Review of Systems     Objective:    BP 138/78 (BP Location: Left Arm, Patient Position: Sitting, Cuff Size: Normal)   Pulse 81   Temp 97.8 F (36.6 C) (Oral)   Ht 5\' 5"  (1.651 m)   Wt 180 lb 12 oz (82 kg)   SpO2 97%   BMI 30.08 kg/m   Wt Readings from Last 3 Encounters:  06/01/18 180 lb 12 oz (82 kg)  05/17/18 182 lb 4 oz (82.7 kg)  02/24/18 185 lb 12 oz (84.3 kg)    Physical Exam  Constitutional: She appears well-developed and well-nourished. No distress.  Abdominal: Soft. Normal appearance and bowel sounds are normal. She exhibits no distension and no mass. There is no hepatosplenomegaly. There is no tenderness. There is no rebound, no guarding, no CVA tenderness and negative Murphy's sign. No hernia.  Musculoskeletal: She exhibits no edema.  Psychiatric: She has a normal mood and affect.  Nursing note and vitals reviewed.  Results for orders placed or performed in visit on 06/01/18  POCT Urinalysis Dipstick (Automated)  Result Value Ref Range   Color, UA orange    Clarity, UA clear    Glucose, UA Negative Negative   Bilirubin, UA 2+    Ketones, UA +/-    Spec Grav, UA 1.025 1.010 - 1.025   Blood, UA negative  pH, UA 6.0 5.0 - 8.0   Protein, UA Positive (A) Negative   Urobilinogen, UA 2.0 (A) 0.2 or 1.0 E.U./dL   Nitrite, UA positive    Leukocytes, UA Large (3+) (A) Negative      Assessment & Plan:   Problem List Items Addressed This Visit    UTI (urinary tract infection) - Primary    UA suspicious for infection. Not enough sample to do microscopy - but sent off for culture. Will Rx keflex 500mg  BID x 1 wk and await culture results.  Discussed azo use. Update if not improving with treatment. Pt agrees with plan.       Relevant Medications   cephALEXin (KEFLEX) 500 MG capsule    Other Visit Diagnoses    Dysuria       Relevant Orders   POCT Urinalysis Dipstick (Automated) (Completed)   Urine Culture        Meds ordered this encounter  Medications  . cephALEXin (KEFLEX) 500 MG capsule    Sig: Take 1 capsule (500 mg total) by mouth 2 (two) times daily.    Dispense:  14 capsule    Refill:  0   Orders Placed This Encounter  Procedures  . Urine Culture  . POCT Urinalysis Dipstick (Automated)    Follow up plan: No follow-ups on file.  Ria Bush, MD

## 2018-06-01 NOTE — Patient Instructions (Signed)
I do think you have urine infection - treat with keflex twice daily for 1 week I will send urine culture to ensure you are on correct antibiotic. Push fluids and rest. May use tylenol and azo as needed for discomfort. Let us know if ongoing symptoms after finishing antibiotic.  Urinary Tract Infection, Adult A urinary tract infection (UTI) is an infection of any part of the urinary tract. The urinary tract includes the:  Kidneys.  Ureters.  Bladder.  Urethra.  These organs make, store, and get rid of pee (urine) in the body. Follow these instructions at home:  Take over-the-counter and prescription medicines only as told by your doctor.  If you were prescribed an antibiotic medicine, take it as told by your doctor. Do not stop taking the antibiotic even if you start to feel better.  Avoid the following drinks: ? Alcohol. ? Caffeine. ? Tea. ? Carbonated drinks.  Drink enough fluid to keep your pee clear or pale yellow.  Keep all follow-up visits as told by your doctor. This is important.  Make sure to: ? Empty your bladder often and completely. Do not to hold pee for long periods of time. ? Empty your bladder before and after sex. ? Wipe from front to back after a bowel movement if you are female. Use each tissue one time when you wipe. Contact a doctor if:  You have back pain.  You have a fever.  You feel sick to your stomach (nauseous).  You throw up (vomit).  Your symptoms do not get better after 3 days.  Your symptoms go away and then come back. Get help right away if:  You have very bad back pain.  You have very bad lower belly (abdominal) pain.  You are throwing up and cannot keep down any medicines or water. This information is not intended to replace advice given to you by your health care provider. Make sure you discuss any questions you have with your health care provider. Document Released: 03/03/2008 Document Revised: 02/21/2016 Document Reviewed:  08/06/2015 Elsevier Interactive Patient Education  [employer].

## 2018-06-01 NOTE — Telephone Encounter (Signed)
PLEASE NOTE: All timestamps contained within this report are represented as [place] Standard Time. CONFIDENTIALTY NOTICE: This fax transmission is intended only for the addressee. It contains information that is legally privileged, confidential or otherwise protected from use or disclosure. If you are not the intended recipient, you are strictly prohibited from reviewing, disclosing, copying using or disseminating any of this information or taking any action in reliance on or regarding this information. If you have received this fax in error, please notify us immediately by telephone so that we can arrange for its return to [place]. Phone: [phone], Toll-Free: [phone], Fax: [phone] Page: 1 of 2 Call Id: [id number] [place] Patient Name: [name] Gender: Female DOB: 01/29/1931 Age: 77 Y 3 M 28 D Return Phone Number: [phone] (Primary), [phone] (Secondary) Address: City/State/Zip: [place] [address] Client [facility] Night - Client Client Site [place] - Night Physician Alma Friendly - NP Contact Type Call Who Is Calling Patient / Member / Family / Caregiver Call Type Triage / Clinical Relationship To Patient Self Return Phone Number [phone] (Primary) Chief Complaint Urination Pain Reason for Call Symptomatic / Request for [place] states she has a UTI and she is wanting to get something call in for that. She is having urination pain and she has been drinking water and she took two azo. Translation No Nurse Assessment Nurse: Solocinski, RN, Beth Date/Time (Eastern Time): 05/29/2018 7:46:07 PM Confirm and document reason for call. If symptomatic, describe symptoms. ---Caller states she is having urination pain since this morning. Caller states she does not have any blood in  urine, fever, or back pain. Caller states she is taking azo and drinking lots of fluids. Caller states she completed augmentin on thursday for sinus infection. Caller states she has some white discharge but no vaginal itching now but did earlier today. Does the patient have any new or worsening symptoms? ---Yes Will a triage be completed? ---Yes Related visit to physician within the last 2 weeks? ---No Does the PT have any chronic conditions? (i.e. diabetes, asthma, this includes High risk factors for pregnancy, etc.) ---Yes List chronic conditions. ---htn, hypothyroidism, anxiety, gerd Is this a behavioral health or substance abuse call? ---No Guidelines Guideline Title Affirmed Question Affirmed Notes Nurse Date/Time Eilene Ghazi Time) Urination Pain - Female Age > 33 years [place], RN, [place] 05/29/2018 7:51:14 PM Disp. Time Eilene Ghazi Time) Disposition Final User 05/29/2018 7:54:37 PM See PCP within 24 Hours Yes Solocinski, RN, Beth PLEASE NOTE: All timestamps contained within this report are represented as [place] Standard Time. CONFIDENTIALTY NOTICE: This fax transmission is intended only for the addressee. It contains information that is legally privileged, confidential or otherwise protected from use or disclosure. If you are not the intended recipient, you are strictly prohibited from reviewing, disclosing, copying using or disseminating any of this information or taking any action in reliance on or regarding this information. If you have received this fax in error, please notify us immediately by telephone so that we can arrange for its return to [place]. Phone: [phone], Toll-Free: [phone], Fax: [phone] Page: 2 of 2 Call Id: [id number] Caller Disagree/Comply Comply Caller Understands Yes PreDisposition Home Care Care Advice Given Per Guideline SEE PCP WITHIN 24 HOURS: * IF OFFICE WILL BE OPEN: You need to be seen within the next 24 hours. Call your doctor (or NP/PA) when  the office opens and make  an appointment. REASSURANCE AND EDUCATION: This could be an urinary tract infection. You should see your PCP to be examined and tested. FLUIDS: Drink extra fluids. Drink 8-10 glasses of liquids a day (Reason: to produce a dilute, non-irritating urine). CAUTION - FLUIDS: * Increased fluid intake may be contraindicated in adults with renal failure or heart failure. * You can discuss this with your doctor. CALL BACK IF: * Fever or back pain occurs * You become worse. Referrals GO TO FACILITY UNDECIDED

## 2018-06-01 NOTE — Telephone Encounter (Signed)
I spoke with pt; pt did not go to see anyone; pt taking AXO but continues with painful urination and frequency. Pt scheduled to see Dr Darnell Level 06/01/18 at 12:15.

## 2018-06-02 NOTE — Telephone Encounter (Signed)
Is patient needing a refill or is this an autofill? Please remind her to use this sparingly and only as needed. Is she trying to do this?

## 2018-06-02 NOTE — Telephone Encounter (Signed)
Name of Medication: ALPRAZolam Duanne Moron) 0.5 MG tablet  Name of Pharmacy: [facility]  Last Fill or Written Date and Quantity: 02/25/2018  Last Office Visit and Type: 06/01/2018 (acute with Dr Darnell Level)  Next Office Visit and Type: AWV on 03/07/2019  Last Controlled Substance Agreement Date: 07/20/2017  Last UDS:07/20/2017

## 2018-06-03 LAB — URINE CULTURE
MICRO NUMBER: 91049299
SPECIMEN QUALITY: ADEQUATE

## 2018-06-03 NOTE — Telephone Encounter (Signed)
Message left for patient to return my call.  

## 2018-06-04 NOTE — Telephone Encounter (Signed)
Message left for patient to return my call.  

## 2018-06-04 NOTE — Telephone Encounter (Signed)
Copied from [place] [phone]. Topic: General - Other >> Jun 04, 2018  2:55 PM Mcneil, Ja-Kwan wrote: Reason for CRM: Pt returned call to the office. Pt requests call back.

## 2018-06-04 NOTE — Telephone Encounter (Signed)
Pt is returning call. Please advise

## 2018-06-05 ENCOUNTER — Other Ambulatory Visit: Payer: Self-pay | Admitting: Family Medicine

## 2018-06-05 MED ORDER — CIPROFLOXACIN HCL 500 MG PO TABS: 500.0000 mg | ORAL_TABLET | Freq: Two times a day (BID) | ORAL | 0 refills | Status: DC

## 2018-06-07 NOTE — Telephone Encounter (Signed)
Noted, refill sent to pharmacy. 

## 2018-06-07 NOTE — Telephone Encounter (Signed)
Patient notified as instructed by telephone and verbalized understanding. Patient stated that she takes one every day and occasionally has to take an extra one half. Patient stated that she did request the refill and she last filled it on 02/25/18. Patient stated that she will try to use it more sparingly and will try to go to every other day and see how she does with that change. Advised patient to check with her pharmacy later and if there is a problem with the refill we will let her know.

## 2018-06-07 NOTE — Telephone Encounter (Signed)
Left another message on answering machine at home number to call back.

## 2018-06-21 ENCOUNTER — Ambulatory Visit
Admission: RE | Admit: 2018-06-21 | Discharge: 2018-06-21 | Disposition: A | Discharge status: Home / Self Care | Payer: Medicare HMO | Source: Ambulatory Visit | Attending: Primary Care | Admitting: Primary Care

## 2018-06-21 DIAGNOSIS — Z780 Asymptomatic menopausal state: Secondary | ICD-10-CM | POA: Diagnosis not present

## 2018-06-21 DIAGNOSIS — M8589 Other specified disorders of bone density and structure, multiple sites: Secondary | ICD-10-CM | POA: Diagnosis not present

## 2018-06-21 DIAGNOSIS — Z1231 Encounter for screening mammogram for malignant neoplasm of breast: Secondary | ICD-10-CM | POA: Diagnosis not present

## 2018-06-21 DIAGNOSIS — Z1239 Encounter for other screening for malignant neoplasm of breast: Secondary | ICD-10-CM

## 2018-06-21 DIAGNOSIS — E2839 Other primary ovarian failure: Secondary | ICD-10-CM

## 2018-08-30 ENCOUNTER — Telehealth: Payer: Self-pay | Admitting: Primary Care

## 2018-08-30 DIAGNOSIS — F411 Generalized anxiety disorder: Secondary | ICD-10-CM

## 2018-08-30 NOTE — Telephone Encounter (Signed)
Last office visit 06/01/2018 with Dr. Danise Mina for cystitis.  Last refilled 06/07/2018 for #90 with no refills. Next appt: 03/09/2019 for CPE.  UDS/Contract 07/20/2017.

## 2018-08-30 NOTE — Telephone Encounter (Signed)
Noted, refill sent to pharmacy. Given cost of new UDS will forgo this with patient. No suspicious activity noted on PMP aware site.

## 2018-09-02 MED ORDER — ALPRAZOLAM 1 MG PO TABS: ORAL_TABLET | ORAL | 0 refills | Status: DC

## 2018-09-02 NOTE — Telephone Encounter (Signed)
Spoken and notified patient of Kate Clark's comments. Patient verbalized understanding.  

## 2018-09-02 NOTE — Telephone Encounter (Signed)
Noted, new Rx provided. Please notify patient to taking 1/2 tablet once daily only as needed for anxiety. Use sparingly. The goal is for her to not have to use this daily given the addictive nature.

## 2018-09-02 NOTE — Telephone Encounter (Signed)
Pt calling to see why alprazolam 0.5 mg was not refilled. Advised pt I spoke with Elmyra Ricks at [facility] and they did receive an approval for alprazolam 0.5 mg but that strength is on back order. Elmyra Ricks request to substitute Alprazolam 1 mg. Please advise. Pt has one more alprazolam 0.5 mg tab left for 09/03/18 and then pt will be out of med.

## 2018-09-30 ENCOUNTER — Other Ambulatory Visit: Payer: Self-pay | Admitting: Primary Care

## 2018-09-30 DIAGNOSIS — F411 Generalized anxiety disorder: Secondary | ICD-10-CM

## 2018-10-04 MED ORDER — ALPRAZOLAM 0.5 MG PO TABS
ORAL_TABLET | ORAL | 0 refills | Status: DC
Start: 2018-10-04 — End: 2019-03-09

## 2018-10-04 NOTE — Telephone Encounter (Signed)
Last prescribed on 09/02/2018 #15  with 0  refills. Last office visit on 06/01/2018 with Dr Darnell Level for acute. Next future appointment 03/07/2019 for AWV. Last UDS and contract on 07/20/2017

## 2018-10-04 NOTE — Telephone Encounter (Signed)
Noted. Refill provided for 0.5 mg tablets as they may be back in stock.

## 2018-10-05 ENCOUNTER — Ambulatory Visit (INDEPENDENT_AMBULATORY_CARE_PROVIDER_SITE_OTHER): Payer: Medicare HMO | Admitting: Family Medicine

## 2018-10-05 ENCOUNTER — Encounter: Payer: Self-pay | Admitting: Family Medicine

## 2018-10-05 VITALS — BP 132/72 | HR 78 | Temp 98.3°F | Ht 65.0 in | Wt 183.5 lb

## 2018-10-05 DIAGNOSIS — R300 Dysuria: Secondary | ICD-10-CM

## 2018-10-05 LAB — POC URINALSYSI DIPSTICK (AUTOMATED)
BILIRUBIN UA: NEGATIVE
GLUCOSE UA: NEGATIVE
Ketones, UA: NEGATIVE
NITRITE UA: NEGATIVE
PH UA: 6 (ref 5.0–8.0)
Protein, UA: POSITIVE — AB
Spec Grav, UA: 1.025 (ref 1.010–1.025)
Urobilinogen, UA: 0.2 E.U./dL

## 2018-10-05 MED ORDER — CIPROFLOXACIN HCL 250 MG PO TABS: 250.0000 mg | ORAL_TABLET | Freq: Two times a day (BID) | ORAL | 0 refills | Status: DC

## 2018-10-05 NOTE — Progress Notes (Signed)
Subjective:    Patient ID: [name], female    DOB: [birth year], 78 y.o.   MRN: [id number]  Dysuria   This is a new problem. The current episode started yesterday. The quality of the pain is described as burning. The pain is moderate. There has been no fever. She is not sexually active. There is no history of pyelonephritis. Associated symptoms include frequency and urgency. Pertinent negatives include no chills, flank pain, hematuria, nausea or vomiting. Associated symptoms comments:  Urinary pressure. She has tried increased fluids for the symptoms. The treatment provided moderate relief.   05/2018 culture showed mulit-drug resistant proteus  Treated with cipro. No diarrhea. Symptoms resolved completely.   Has only had one UTI in 2019.  Blood pressure 132/72, pulse 78, temperature 98.3 F (36.8 C), temperature source Oral, height 5\' 5"  (1.651 m), weight 183 lb 8 oz (83.2 kg). Social History /Family History/Past Medical History reviewed in detail and updated in EMR if needed.  Review of Systems  Constitutional: Negative for chills.  Gastrointestinal: Negative for nausea and vomiting.  Genitourinary: Positive for dysuria, frequency and urgency. Negative for flank pain and hematuria.       Objective:   Physical Exam Constitutional:      General: She is not in acute distress.    Appearance: Normal appearance. She is well-developed. She is not ill-appearing or toxic-appearing.  HENT:     Head: Normocephalic.     Right Ear: Hearing, tympanic membrane, ear canal and external ear normal. Tympanic membrane is not erythematous, retracted or bulging.     Left Ear: Hearing, tympanic membrane, ear canal and external ear normal. Tympanic membrane is not erythematous, retracted or bulging.     Nose: No mucosal edema or rhinorrhea.     Right Sinus: No maxillary sinus tenderness or frontal sinus tenderness.     Left Sinus: No maxillary sinus tenderness or frontal sinus tenderness.   Mouth/Throat:     Pharynx: Uvula midline.  Eyes:     General: Lids are normal. Lids are everted, no foreign bodies appreciated.     Conjunctiva/sclera: Conjunctivae normal.     Pupils: Pupils are equal, round, and reactive to light.  Neck:     Musculoskeletal: Normal range of motion and neck supple.     Thyroid: No thyroid mass or thyromegaly.     Vascular: No carotid bruit.     Trachea: Trachea normal.  Cardiovascular:     Rate and Rhythm: Normal rate and regular rhythm.     Pulses: Normal pulses.     Heart sounds: Normal heart sounds, S1 normal and S2 normal. No murmur. No friction rub. No gallop.   Pulmonary:     Effort: Pulmonary effort is normal. No tachypnea or respiratory distress.     Breath sounds: Normal breath sounds. No decreased breath sounds, wheezing, rhonchi or rales.  Abdominal:     General: Bowel sounds are normal.     Palpations: Abdomen is soft.     Tenderness: There is abdominal tenderness in the suprapubic area.  Skin:    General: Skin is warm and dry.     Findings: No rash.  Neurological:     Mental Status: She is alert.  Psychiatric:        Mood and Affect: Mood is not anxious or depressed.        Speech: Speech normal.        Behavior: Behavior normal. Behavior is cooperative.  Thought Content: Thought content normal.        Judgment: Judgment normal.           Assessment & Plan:

## 2018-10-05 NOTE — Addendum Note (Signed)
Addended by: Carter Kitten on: 10/05/2018 10:20 AM   Modules accepted: Orders

## 2018-10-05 NOTE — Patient Instructions (Addendum)
We will send urine for culture.  Continue to push fluids. Tart cipro course. Call if symptoms not resolving or fever and flank pain or not able to tolerate antibiotics.

## 2018-10-05 NOTE — Addendum Note (Signed)
Addended by: Modena Nunnery on: 10/05/2018 12:29 PM   Modules accepted: Orders

## 2018-10-05 NOTE — Assessment & Plan Note (Signed)
Likely UTI .Marland Kitchen typical symtpoms and positive UA. Given drug resistant proteus in past.. send for culture but start cipro.

## 2018-10-07 LAB — URINE CULTURE
MICRO NUMBER: 21894
SPECIMEN QUALITY:: ADEQUATE

## 2019-01-01 ENCOUNTER — Other Ambulatory Visit: Payer: Self-pay | Admitting: Primary Care

## 2019-01-01 DIAGNOSIS — K219 Gastro-esophageal reflux disease without esophagitis: Secondary | ICD-10-CM

## 2019-02-11 ENCOUNTER — Other Ambulatory Visit: Payer: Self-pay | Admitting: Primary Care

## 2019-02-11 DIAGNOSIS — I10 Essential (primary) hypertension: Secondary | ICD-10-CM

## 2019-03-02 ENCOUNTER — Other Ambulatory Visit: Payer: Self-pay | Admitting: Primary Care

## 2019-03-02 DIAGNOSIS — E038 Other specified hypothyroidism: Secondary | ICD-10-CM

## 2019-03-02 DIAGNOSIS — E785 Hyperlipidemia, unspecified: Secondary | ICD-10-CM

## 2019-03-02 DIAGNOSIS — I10 Essential (primary) hypertension: Secondary | ICD-10-CM

## 2019-03-07 ENCOUNTER — Ambulatory Visit (INDEPENDENT_AMBULATORY_CARE_PROVIDER_SITE_OTHER): Payer: Medicare HMO

## 2019-03-07 DIAGNOSIS — Z0000 Encounter for general adult medical examination without abnormal findings: Secondary | ICD-10-CM | POA: Diagnosis not present

## 2019-03-07 NOTE — Progress Notes (Signed)
Subjective:   [name] is a 78 y.o. female who presents for Medicare Annual (Subsequent) preventive examination.  Review of Systems:  N/A Cardiac Risk Factors include: advanced age (>72men, >20 women);dyslipidemia;obesity (BMI >30kg/m2);hypertension     Objective:     Vitals: There were no vitals taken for this visit.  There is no height or weight on file to calculate BMI.  Advanced Directives 03/07/2019 02/17/2018 02/11/2017  Does Patient Have a Medical Advance Directive? Yes Yes Yes  Type of Paramedic of [place];Living will [place];Living will [employer];Living will  Copy of [place] in Chart? No - copy requested No - copy requested No - copy requested    Tobacco Social History   Tobacco Use  Smoking Status Former Smoker  . Packs/day: 0.20  . Years: 7.00  . Pack years: 1.40  . Types: Cigarettes  . Last attempt to quit: 09/29/1970  . Years since quitting: 48.4  Smokeless Tobacco Never Used  Tobacco Comment   1 pack per week     Counseling given: No Comment: 1 pack per week   Clinical Intake:  Pre-visit preparation completed: Yes  Pain : No/denies pain Pain Score: 0-No pain     Nutritional Status: BMI 25 -29 Overweight Nutritional Risks: None Diabetes: No  What is the last grade level you completed in school?: 12th grade  Interpreter Needed?: No  Comments: pt lives with spouse Information entered by :: LPinson, LPN  Past Medical History:  Diagnosis Date  . Adenocarcinoma, breast ([place])   . Anxiety   . Colonic polyp    5 mm adenoma 1977  . Degenerative joint disease   . Fibromyalgia   . GERD (gastroesophageal reflux disease)   . Hemorrhoids   . History of anemia   . History of shingles   . Hyperlipidemia   . Hypertension   . Hypothyroidism   . IBS (irritable bowel syndrome)   . NASH (nonalcoholic steatohepatitis)   . Osteopenia   . Personal history of  chemotherapy 2005   Left Breast Cancer  . Personal history of radiation therapy 2005   Left Breast Cancer   Past Surgical History:  Procedure Laterality Date  . ABDOMINAL HYSTERECTOMY    . APPENDECTOMY    . BILATERAL SALPINGOOPHORECTOMY  1978  . BREAST LUMPECTOMY Left 2005   left--node dissection 7/05 by Dr. Charlynne Pander  . LAPAROSCOPIC CHOLECYSTECTOMY    . ROTATOR CUFF REPAIR     left   Family History  Problem Relation Age of Onset  . Prostate cancer Father   . Prostate cancer Brother   . Stroke Mother   . Aneurysm Brother   . Emphysema Sister   . Crohn's disease Sister   . Liver disease Brother   . Hypertension Brother    Social History   Socioeconomic History  . Marital status: Married    Spouse name: [name]  . Number of children: 2  . Years of education: Not on file  . Highest education level: Not on file  Occupational History  . Occupation: retired  Scientific laboratory technician  . Financial resource strain: Not on file  . Food insecurity:    Worry: Not on file    Inability: Not on file  . Transportation needs:    Medical: Not on file    Non-medical: Not on file  Tobacco Use  . Smoking status: Former Smoker    Packs/day: 0.20    Years: 7.00  Pack years: 1.40    Types: Cigarettes    Last attempt to quit: 09/29/1970    Years since quitting: 48.4  . Smokeless tobacco: Never Used  . Tobacco comment: 1 pack per week  Substance and Sexual Activity  . Alcohol use: Not Currently  . Drug use: No  . Sexual activity: Yes  Lifestyle  . Physical activity:    Days per week: Not on file    Minutes per session: Not on file  . Stress: Not on file  Relationships  . Social connections:    Talks on phone: Not on file    Gets together: Not on file    Attends religious service: Not on file    Active member of club or organization: Not on file    Attends meetings of clubs or organizations: Not on file    Relationship status: Not on file  Other Topics Concern  . Not on file  Social  History Narrative   Would desire CPR    Outpatient Encounter Medications as of 03/07/2019  Medication Sig  . ALPRAZolam (XANAX) 0.5 MG tablet Take 1/2 to 1 tablet by mouth once daily as needed for anxiety. Use sparingly.  Marland Kitchen amLODipine (NORVASC) 5 MG tablet TAKE 1 TABLET BY MOUTH EVERY DAY  . aspirin (ADULT ASPIRIN EC LOW STRENGTH) 81 MG EC tablet Take 81 mg by mouth daily.    Marland Kitchen levothyroxine (SYNTHROID, LEVOTHROID) 75 MCG tablet Take 1 tablet (75 mcg total) by mouth daily.  Marland Kitchen omeprazole (PRILOSEC) 40 MG capsule TAKE 1 CAPSULE BY MOUTH EVERY DAY  . [DISCONTINUED] ciprofloxacin (CIPRO) 250 MG tablet Take 1 tablet (250 mg total) by mouth 2 (two) times daily.   No facility-administered encounter medications on file as of 03/07/2019.     Activities of Daily Living In your present state of health, do you have any difficulty performing the following activities: 03/07/2019  Hearing? Y  Vision? N  Difficulty concentrating or making decisions? N  Walking or climbing stairs? N  Dressing or bathing? N  Doing errands, shopping? N  Preparing Food and eating ? N  Using the Toilet? N  In the past six months, have you accidently leaked urine? N  Do you have problems with loss of bowel control? N  Managing your Medications? N  Managing your Finances? N  Housekeeping or managing your Housekeeping? N  Some recent data might be hidden    Patient Care Team: [name], NP as PCP - General (Internal Medicine) Gatha Mayer, MD as Consulting Physician (Gastroenterology) Magrinat, Virgie Dad, MD as Consulting Physician (Oncology) Susa Day, MD as Consulting Physician (Orthopedic Surgery)    Assessment:   This is a routine wellness examination for [name].  Vision Screening Comments: Last vision exam date unknown  Exercise Activities and Dietary recommendations Current Exercise Habits: The patient does not participate in regular exercise at present, Exercise limited by: orthopedic  condition(s)  Goals    . Patient Stated     Starting 03/08/19, I will continue to take medications as prescribed.        Fall Risk Fall Risk  03/07/2019 02/17/2018 02/11/2017 02/13/2016 01/20/2013  Falls in the past year? 0 No No No No    Depression Screen PHQ 2/9 Scores 03/07/2019 02/17/2018 02/11/2017 02/13/2016  PHQ - 2 Score 0 0 0 0  PHQ- 9 Score 0 0 - -     Cognitive Function MMSE - Mini Mental State Exam 03/07/2019 02/17/2018 02/11/2017  Orientation to time 5  5 5  Orientation to Place 5 5 5   Registration 3 3 3   Attention/ Calculation 0 0 0  Recall 3 3 3   Language- name 2 objects 0 0 0  Language- repeat 1 1 1   Language- follow 3 step command 0 3 3  Language- read & follow direction 0 0 0  Write a sentence 0 0 0  Copy design 0 0 0  Total score 17 20 20      PLEASE NOTE: A Mini-Cog screen was completed. Maximum score is 17. A value of 0 denotes this part of [place] MMSE was not completed or the patient failed this part of the Mini-Cog screening.   Mini-Cog Screening Orientation to Time - Max 5 pts Orientation to Place - Max 5 pts Registration - Max 3 pts Recall - Max 3 pts Language Repeat - Max 1 pts      Immunization History  Administered Date(s) Administered  . Tdap 01/15/2011    Screening Tests Health Maintenance  Topic Date Due  . INFLUENZA VACCINE  02/11/2026 (Originally 04/30/2019)  . PNA vac Low Risk Adult (1 of 2 - PCV13) 02/11/2026 (Originally 01/29/2005)  . TETANUS/TDAP  01/14/2021  . DEXA SCAN  Completed       Plan:     I have personally reviewed, addressed, and noted the following in the patient's chart:  A. Medical and social history B. Use of alcohol, tobacco or illicit drugs  C. Current medications and supplements D. Functional ability and status E.  Nutritional status F.  Physical activity G. Advance directives H. List of other physicians I.  Hospitalizations, surgeries, and ER visits in previous 12 months J.  Vitals (unless it is a telemedicine  encounter) K. Screenings to include cognitive, depression, hearing, vision (NOTE: hearing and vision screenings not completed in telemedicine encounter) L. Referrals and appointments   In addition, I have reviewed and discussed with patient certain preventive protocols, quality metrics, and best practice recommendations. A written personalized care plan for preventive services and recommendations were provided to patient.  With patient's permission, we connected on 03/07/19 at  9:00 AM EDT by a video enabled telemedicine application. Two patient identifiers were used to ensure the encounter occurred with the correct person.    Patient was in home and writer was in office.   Signed,   Lindell Noe, MHA, BS, LPN Health Coach

## 2019-03-08 ENCOUNTER — Other Ambulatory Visit (INDEPENDENT_AMBULATORY_CARE_PROVIDER_SITE_OTHER): Payer: Medicare HMO

## 2019-03-08 DIAGNOSIS — E038 Other specified hypothyroidism: Secondary | ICD-10-CM | POA: Diagnosis not present

## 2019-03-08 DIAGNOSIS — E785 Hyperlipidemia, unspecified: Secondary | ICD-10-CM

## 2019-03-08 DIAGNOSIS — I10 Essential (primary) hypertension: Secondary | ICD-10-CM

## 2019-03-08 LAB — LIPID PANEL
Cholesterol: 180 mg/dL (ref 0–200)
HDL: 45.2 mg/dL (ref >39.00)
LDL Cholesterol: 110 mg/dL — ABNORMAL HIGH (ref 0–99)
NonHDL: 134.37
Total CHOL/HDL Ratio: 4
Triglycerides: 123 mg/dL (ref 0.0–149.0)
VLDL: 24.6 mg/dL (ref 0.0–40.0)

## 2019-03-08 LAB — COMPREHENSIVE METABOLIC PANEL
ALT: 29 U/L (ref 0–35)
AST: 41 U/L — ABNORMAL HIGH (ref 0–37)
Albumin: 3.7 g/dL (ref 3.5–5.2)
Alkaline Phosphatase: 124 U/L — ABNORMAL HIGH (ref 39–117)
BUN: 15 mg/dL (ref 6–23)
CO2: 22 mEq/L (ref 19–32)
Calcium: 9.3 mg/dL (ref 8.4–10.5)
Chloride: 106 mEq/L (ref 96–112)
Creatinine, Ser: 0.87 mg/dL (ref 0.40–1.20)
GFR: 62.79 mL/min (ref >60.00)
Glucose, Bld: 93 mg/dL (ref 70–99)
Potassium: 4 mEq/L (ref 3.5–5.1)
Sodium: 138 mEq/L (ref 135–145)
Total Bilirubin: 0.9 mg/dL (ref 0.2–1.2)
Total Protein: 6.9 g/dL (ref 6.0–8.3)

## 2019-03-08 LAB — HEMOGLOBIN A1C: Hgb A1c MFr Bld: 5.4 % (ref 4.6–6.5)

## 2019-03-08 LAB — TSH: TSH: 4.27 u[IU]/mL (ref 0.35–4.50)

## 2019-03-08 NOTE — Progress Notes (Signed)
PCP notes:   Health maintenance:  No gaps identified.  Abnormal screenings:   None  Patient concerns:   None  Nurse concerns:  None  Next PCP appt:   03/09/19 @ 1020

## 2019-03-08 NOTE — Patient Instructions (Signed)
[name] , Thank you for taking time to come for your Medicare Wellness Visit. I appreciate your ongoing commitment to your health goals. Please review the following plan we discussed and let me know if I can assist you in the future.   These are the goals we discussed: Goals    . Patient Stated     Starting 03/08/19, I will continue to take medications as prescribed.        This is a list of the screening recommended for you and due dates:  Health Maintenance  Topic Date Due  . Flu Shot  02/11/2026*  . Pneumonia vaccines (1 of 2 - PCV13) 02/11/2026*  . Tetanus Vaccine  01/14/2021  . DEXA scan (bone density measurement)  Completed  *Topic was postponed. The date shown is not the original due date.   Preventive Care for Adults  A healthy lifestyle and preventive care can promote health and wellness. Preventive health guidelines for adults include the following key practices.  . A routine yearly physical is a good way to check with your health care provider about your health and preventive screening. It is a chance to share any concerns and updates on your health and to receive a thorough exam.  . Visit your dentist for a routine exam and preventive care every 6 months. Brush your teeth twice a day and floss once a day. Good oral hygiene prevents tooth decay and gum disease.  . The frequency of eye exams is based on your age, health, family medical history, use  of contact lenses, and other factors. Follow your health care provider's recommendations for frequency of eye exams.  . Eat a healthy diet. Foods like vegetables, fruits, whole grains, low-fat dairy products, and lean protein foods contain the nutrients you need without too many calories. Decrease your intake of foods high in solid fats, added sugars, and salt. Eat the right amount of calories for you. Get information about a proper diet from your health care provider, if necessary.  . Regular physical exercise is one of the  most important things you can do for your health. Most adults should get at least 150 minutes of moderate-intensity exercise (any activity that increases your heart rate and causes you to sweat) each week. In addition, most adults need muscle-strengthening exercises on 2 or more days a week.  Silver Sneakers may be a benefit available to you. To determine eligibility, you may visit the website: www.silversneakers.com or contact program at (904) 623-3083 Mon-Fri between 8AM-8PM.   . Maintain a healthy weight. The body mass index (BMI) is a screening tool to identify possible weight problems. It provides an estimate of body fat based on height and weight. Your health care provider can find your BMI and can help you achieve or maintain a healthy weight.   For adults 20 years and older: ? A BMI below 18.5 is considered underweight. ? A BMI of 18.5 to 24.9 is normal. ? A BMI of 25 to 29.9 is considered overweight. ? A BMI of 30 and above is considered obese.   . Maintain normal blood lipids and cholesterol levels by exercising and minimizing your intake of saturated fat. Eat a balanced diet with plenty of fruit and vegetables. Blood tests for lipids and cholesterol should begin at age 28 and be repeated every 5 years. If your lipid or cholesterol levels are high, you are over 50, or you are at high risk for heart disease, you may need your cholesterol levels checked  more frequently. Ongoing high lipid and cholesterol levels should be treated with medicines if diet and exercise are not working.  . If you smoke, find out from your health care provider how to quit. If you do not use tobacco, please do not start.  . If you choose to drink alcohol, please do not consume more than 2 drinks per day. One drink is considered to be 12 ounces (355 mL) of beer, 5 ounces (148 mL) of wine, or 1.5 ounces (44 mL) of liquor.  . If you are 45-85 years old, ask your health care provider if you should take aspirin to  prevent strokes.  . Use sunscreen. Apply sunscreen liberally and repeatedly throughout the day. You should seek shade when your shadow is shorter than you. Protect yourself by wearing long sleeves, pants, a wide-brimmed hat, and sunglasses year round, whenever you are outdoors.  . Once a month, do a whole body skin exam, using a mirror to look at the skin on your back. Tell your health care provider of new moles, moles that have irregular borders, moles that are larger than a pencil eraser, or moles that have changed in shape or color.

## 2019-03-09 ENCOUNTER — Ambulatory Visit (INDEPENDENT_AMBULATORY_CARE_PROVIDER_SITE_OTHER): Payer: Medicare HMO | Admitting: Primary Care

## 2019-03-09 ENCOUNTER — Other Ambulatory Visit: Payer: Self-pay

## 2019-03-09 VITALS — BP 122/82 | HR 73 | Temp 98.2°F | Ht 65.0 in | Wt 183.0 lb

## 2019-03-09 DIAGNOSIS — K760 Fatty (change of) liver, not elsewhere classified: Secondary | ICD-10-CM

## 2019-03-09 DIAGNOSIS — E039 Hypothyroidism, unspecified: Secondary | ICD-10-CM | POA: Diagnosis not present

## 2019-03-09 DIAGNOSIS — N3000 Acute cystitis without hematuria: Secondary | ICD-10-CM

## 2019-03-09 DIAGNOSIS — E785 Hyperlipidemia, unspecified: Secondary | ICD-10-CM

## 2019-03-09 DIAGNOSIS — K219 Gastro-esophageal reflux disease without esophagitis: Secondary | ICD-10-CM | POA: Diagnosis not present

## 2019-03-09 DIAGNOSIS — M5440 Lumbago with sciatica, unspecified side: Secondary | ICD-10-CM | POA: Diagnosis not present

## 2019-03-09 DIAGNOSIS — R69 Illness, unspecified: Secondary | ICD-10-CM | POA: Diagnosis not present

## 2019-03-09 DIAGNOSIS — I10 Essential (primary) hypertension: Secondary | ICD-10-CM | POA: Diagnosis not present

## 2019-03-09 DIAGNOSIS — Z0000 Encounter for general adult medical examination without abnormal findings: Secondary | ICD-10-CM

## 2019-03-09 DIAGNOSIS — F411 Generalized anxiety disorder: Secondary | ICD-10-CM

## 2019-03-09 DIAGNOSIS — E038 Other specified hypothyroidism: Secondary | ICD-10-CM

## 2019-03-09 DIAGNOSIS — G8929 Other chronic pain: Secondary | ICD-10-CM

## 2019-03-09 MED ORDER — LEVOTHYROXINE SODIUM 75 MCG PO TABS: ORAL_TABLET | ORAL | 3 refills | Status: DC

## 2019-03-09 MED ORDER — SERTRALINE HCL 25 MG PO TABS: 25.0000 mg | ORAL_TABLET | Freq: Every day | ORAL | 1 refills | Status: DC

## 2019-03-09 MED ORDER — ALPRAZOLAM 0.5 MG PO TABS: ORAL_TABLET | ORAL | 0 refills | Status: DC

## 2019-03-09 MED ORDER — OMEPRAZOLE 40 MG PO CPDR: DELAYED_RELEASE_CAPSULE | ORAL | 3 refills | Status: DC

## 2019-03-09 NOTE — Progress Notes (Signed)
I reviewed health advisor's note, was available for consultation, and agree with documentation and plan.  

## 2019-03-09 NOTE — Assessment & Plan Note (Signed)
Mammogram from 2019 stable. She defers this year and would like to repeat next year.  History of breast cancer in 2005, normal mammograms since.

## 2019-03-09 NOTE — Assessment & Plan Note (Signed)
Declines all immunizations. Mammogram due next year. Colonoscopy would be due next year but she doesn't wish to continue given age. This is appropriate.  Exam stable. Labs reviewed. Follow up in 1 year for CPE.

## 2019-03-09 NOTE — Assessment & Plan Note (Signed)
Liver enzymes stable. Encouraged a healthy diet and regular exercise.

## 2019-03-09 NOTE — Assessment & Plan Note (Addendum)
Stable in the office today, also with home readings. Chronic bilateral ankle edema could be contributing so we will have her hold this for two weeks to see if her swelling is reduced. She will monitor BP as well.   BMP reviewed.

## 2019-03-09 NOTE — Patient Instructions (Signed)
Hold your Amlodipine (blood pressure) medication for two weeks to see if this helps to reduce your ankle swelling. Monitor your blood pressure and update me in a few weeks.  Start sertraline (Zoloft) 25 mg once daily for anxiety. Start with 1/2 tablet daily for 6 days, then increase to 1 full tablet thereafter.  Use the alprazolam sparingly as discussed.  Be sure to take your levothyroxine (thyroid medication) every morning on an empty stomach with water only. No food or other medications for 30 minutes. No heartburn medication, iron pills, calcium, vitamin D, or magnesium pills within four hours of taking levothyroxine.   Please send me an update regarding your anxiety in 4-6 weeks.   It was a pleasure to see you today!

## 2019-03-09 NOTE — Assessment & Plan Note (Signed)
Doing well on omeprazole 40 mg daily, infrequent breakthrough symptoms with certain foods. Continue same.

## 2019-03-09 NOTE — Assessment & Plan Note (Addendum)
She is down to alprazolam 0.25 mg daily for the most part. She is still taking daily. Long discussion regarding benzos and long term effects and side effects. Our goal is to wean off completely.   She does have daily anxiety symptoms, takes care of ill family members. Discussed that there are better and safer options for anxiety treatment, she agrees to try Zoloft.   Rx for Zoloft 25 mg sent to pharmacy. Patient is to take 1/2 tablet daily for 6 days, then advance to 1 full tablet thereafter. We discussed possible side effects of headache, GI upset, drowsiness, and SI/HI. If thoughts of SI/HI develop, we discussed to present to the emergency immediately. Patient verbalized understanding.   We will call her in 4-6 weeks to see how she's doing.

## 2019-03-09 NOTE — Progress Notes (Signed)
Subjective:    Patient ID: [name], female    DOB: [birth year], 78 y.o.   MRN: [id number]  HPI  [name] is a 78 year old female who presents today for complete physical.  Immunizations: -Tetanus: Completed in 2012 -Influenza: Declines -Pneumonia: Declines  -Shingles: Declines   Diet: She endorses a healthy diet with vegetables, fruit, baked protein. Little sweets. She drinks mostly water.  Exercise: She is active at home and in the yard.   Eye exam: Completed several years ago. She will schedule.  Dental exam: No recent exam Colonoscopy: Completed in 2011 Mammogram: Completed in 2019, negative Dexa: Completed in 2019, osteopenia. She is taking calcium and vitamin D  BP Readings from Last 3 Encounters:  03/09/19 122/82  10/05/18 132/72  06/01/18 138/78     Review of Systems  Constitutional: Negative for unexpected weight change.  HENT: Negative for rhinorrhea.   Respiratory: Negative for cough and shortness of breath.   Cardiovascular: Positive for leg swelling. Negative for chest pain.       Chronic ankle and foot edema for years  Gastrointestinal: Negative for constipation and diarrhea.  Genitourinary: Negative for difficulty urinating and menstrual problem.  Musculoskeletal: Positive for back pain.       Chronic back pain  Skin: Negative for rash.  Allergic/Immunologic: Negative for environmental allergies.  Neurological: Negative for dizziness, numbness and headaches.  Psychiatric/Behavioral:       Daily anxiety, has been reducing alprazolam to 1/2 tablet once to twice daily.       Past Medical History:  Diagnosis Date  . Adenocarcinoma, breast ([place])   . Anxiety   . Colonic polyp    5 mm adenoma 1977  . Degenerative joint disease   . Fibromyalgia   . GERD (gastroesophageal reflux disease)   . Hemorrhoids   . History of anemia   . History of shingles   . Hyperlipidemia   . Hypertension   . Hypothyroidism   . IBS (irritable bowel  syndrome)   . NASH (nonalcoholic steatohepatitis)   . Osteopenia   . Personal history of chemotherapy 2005   Left Breast Cancer  . Personal history of radiation therapy 2005   Left Breast Cancer     Social History   Socioeconomic History  . Marital status: Married    Spouse name: [name]  . Number of children: 2  . Years of education: Not on file  . Highest education level: Not on file  Occupational History  . Occupation: retired  Scientific laboratory technician  . Financial resource strain: Not on file  . Food insecurity:    Worry: Not on file    Inability: Not on file  . Transportation needs:    Medical: Not on file    Non-medical: Not on file  Tobacco Use  . Smoking status: Former Smoker    Packs/day: 0.20    Years: 7.00    Pack years: 1.40    Types: Cigarettes    Last attempt to quit: 09/29/1970    Years since quitting: 48.4  . Smokeless tobacco: Never Used  . Tobacco comment: 1 pack per week  Substance and Sexual Activity  . Alcohol use: Not Currently  . Drug use: No  . Sexual activity: Yes  Lifestyle  . Physical activity:    Days per week: Not on file    Minutes per session: Not on file  . Stress: Not on file  Relationships  . Social connections:    Talks on phone:  Not on file    Gets together: Not on file    Attends religious service: Not on file    Active member of club or organization: Not on file    Attends meetings of clubs or organizations: Not on file    Relationship status: Not on file  . Intimate partner violence:    Fear of current or ex partner: Not on file    Emotionally abused: Not on file    Physically abused: Not on file    Forced sexual activity: Not on file  Other Topics Concern  . Not on file  Social History Narrative   Would desire CPR    Past Surgical History:  Procedure Laterality Date  . ABDOMINAL HYSTERECTOMY    . APPENDECTOMY    . BILATERAL SALPINGOOPHORECTOMY  1978  . BREAST LUMPECTOMY Left 2005   left--node dissection 7/05 by Dr. Charlynne Pander  . LAPAROSCOPIC CHOLECYSTECTOMY    . ROTATOR CUFF REPAIR     left    Family History  Problem Relation Age of Onset  . Prostate cancer Father   . Prostate cancer Brother   . Stroke Mother   . Aneurysm Brother   . Emphysema Sister   . Crohn's disease Sister   . Liver disease Brother   . Hypertension Brother     Allergies  Allergen Reactions  . Ciprofloxacin Diarrhea  . Codeine     REACTION: nausea and vomiting    Current Outpatient Medications on File Prior to Visit  Medication Sig Dispense Refill  . amLODipine (NORVASC) 5 MG tablet TAKE 1 TABLET BY MOUTH EVERY DAY 90 tablet 1  . aspirin (ADULT ASPIRIN EC LOW STRENGTH) 81 MG EC tablet Take 81 mg by mouth daily.       No current facility-administered medications on file prior to visit.     BP 122/82   Pulse 73   Temp 98.2 F (36.8 C) (Temporal)   Ht 5\' 5"  (1.651 m)   Wt 183 lb (83 kg)   SpO2 98%   BMI 30.45 kg/m    Objective:   Physical Exam  Constitutional: She is oriented to person, place, and time. She appears well-nourished.  HENT:  Mouth/Throat: No oropharyngeal exudate.  Eyes: Pupils are equal, round, and reactive to light. EOM are normal.  Neck: Neck supple. No thyromegaly present.  Cardiovascular: Normal rate and regular rhythm.  Moderate bilateral ankle and foot edema.  Respiratory: Effort normal and breath sounds normal.  GI: Soft. Bowel sounds are normal. There is no abdominal tenderness.  Musculoskeletal: Normal range of motion.  Neurological: She is alert and oriented to person, place, and time.  Skin: Skin is warm and dry.  Psychiatric: She has a normal mood and affect.           Assessment & Plan:

## 2019-03-09 NOTE — Assessment & Plan Note (Signed)
Lipids stable off medication. Continue to monitor.

## 2019-03-09 NOTE — Assessment & Plan Note (Signed)
No recent infections, she has added a cranberry tablet daily.

## 2019-03-09 NOTE — Assessment & Plan Note (Signed)
Compliant to levothyroxine and is taking this with her other medications. She doesn't take her PPI or calcium until after lunch.   Recent TSH stable. Continue current dose of 75 mcg.

## 2019-03-09 NOTE — Assessment & Plan Note (Signed)
Following with orthopedics through [facility] in [place]. She is working on stretching and activity. Underwent MRI and xrays several years ago. Prefers not to start injections.

## 2019-03-23 ENCOUNTER — Telehealth: Payer: Self-pay | Admitting: Primary Care

## 2019-03-23 NOTE — Telephone Encounter (Signed)
Message left for patient to return my call.  

## 2019-03-23 NOTE — Telephone Encounter (Addendum)
-----   Message from [name], NP sent at 03/09/2019 11:08 AM EDT ----- Regarding: Blood Pressure and Abkle Edema  Will you please check on patient? How's she doing since we held her Amlodipine for 2 weeks? Any improvement in ankle swelling? How's her BP running?

## 2019-03-25 NOTE — Telephone Encounter (Signed)
Message left for patient to return my call.  

## 2019-03-29 ENCOUNTER — Encounter: Payer: Self-pay | Admitting: *Deleted

## 2019-03-29 NOTE — Telephone Encounter (Signed)
Message left for patient to return my call.  

## 2019-03-31 ENCOUNTER — Other Ambulatory Visit: Payer: Self-pay | Admitting: Primary Care

## 2019-03-31 DIAGNOSIS — F411 Generalized anxiety disorder: Secondary | ICD-10-CM

## 2019-03-31 NOTE — Addendum Note (Signed)
Addended by: [name] on: 03/31/2019 05:26 PM   Modules accepted: Orders

## 2019-03-31 NOTE — Telephone Encounter (Signed)
Please have her continue to hold the Amlodipine and to notify me if her BP runs at or above 140/90 on a consistent basis.

## 2019-03-31 NOTE — Telephone Encounter (Signed)
Patient called back today. She was out of town. Notified patient of Kate's comments. Patient stated her BP have been up and down. It has been running as high as 160/80 and lowest at 130/70. The swelling is still there but she feels like it is a little better than before.

## 2019-04-04 ENCOUNTER — Other Ambulatory Visit: Payer: Self-pay | Admitting: Primary Care

## 2019-04-04 DIAGNOSIS — E039 Hypothyroidism, unspecified: Secondary | ICD-10-CM

## 2019-04-04 NOTE — Telephone Encounter (Signed)
Spoken and notified patient of Kate Clark's comments. Patient verbalized understanding.  

## 2019-04-07 ENCOUNTER — Telehealth: Payer: Self-pay | Admitting: Primary Care

## 2019-04-07 ENCOUNTER — Other Ambulatory Visit: Payer: Self-pay | Admitting: Primary Care

## 2019-04-07 DIAGNOSIS — I10 Essential (primary) hypertension: Secondary | ICD-10-CM

## 2019-04-07 DIAGNOSIS — E039 Hypothyroidism, unspecified: Secondary | ICD-10-CM

## 2019-04-07 NOTE — Telephone Encounter (Signed)
-----   Message from [name], NP sent at 03/09/2019 11:09 AM EDT ----- Regarding: Anxiety How's she doing since we started the Zoloft for anxiety? Has she reduced the amount of Xanax she's taking?

## 2019-04-08 IMAGING — MG DIGITAL SCREENING BILATERAL MAMMOGRAM WITH CAD
5 series · 5 of 5 positions shown · non-contrast
Comparison: Previous exam(s).

CLINICAL DATA: Screening.

EXAM:
DIGITAL SCREENING BILATERAL MAMMOGRAM WITH CAD

[R MLO (1 of 2)]
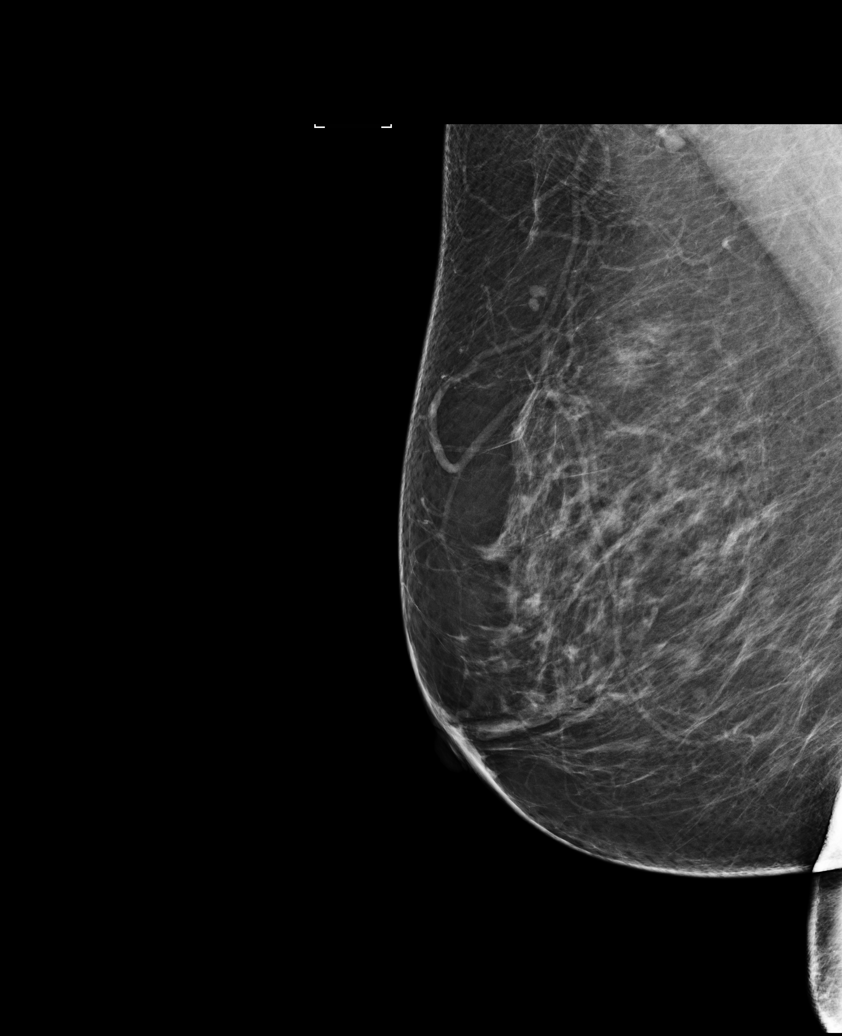

[L MLO]
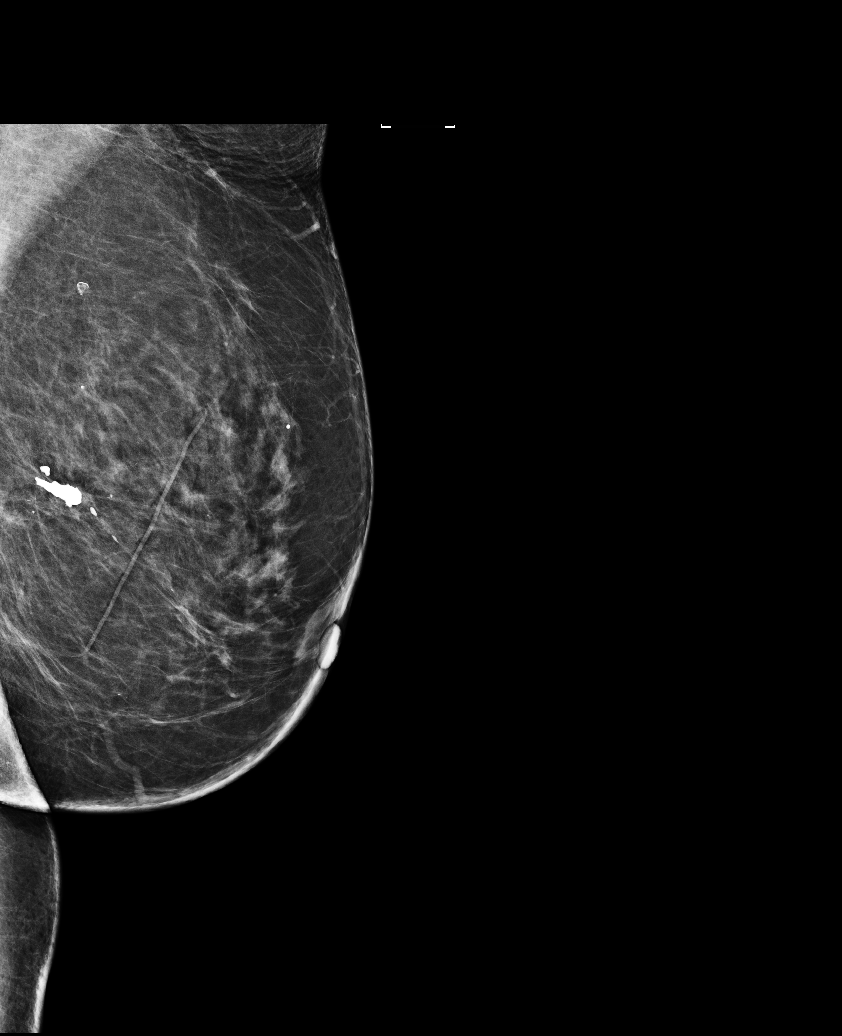

[R CC]
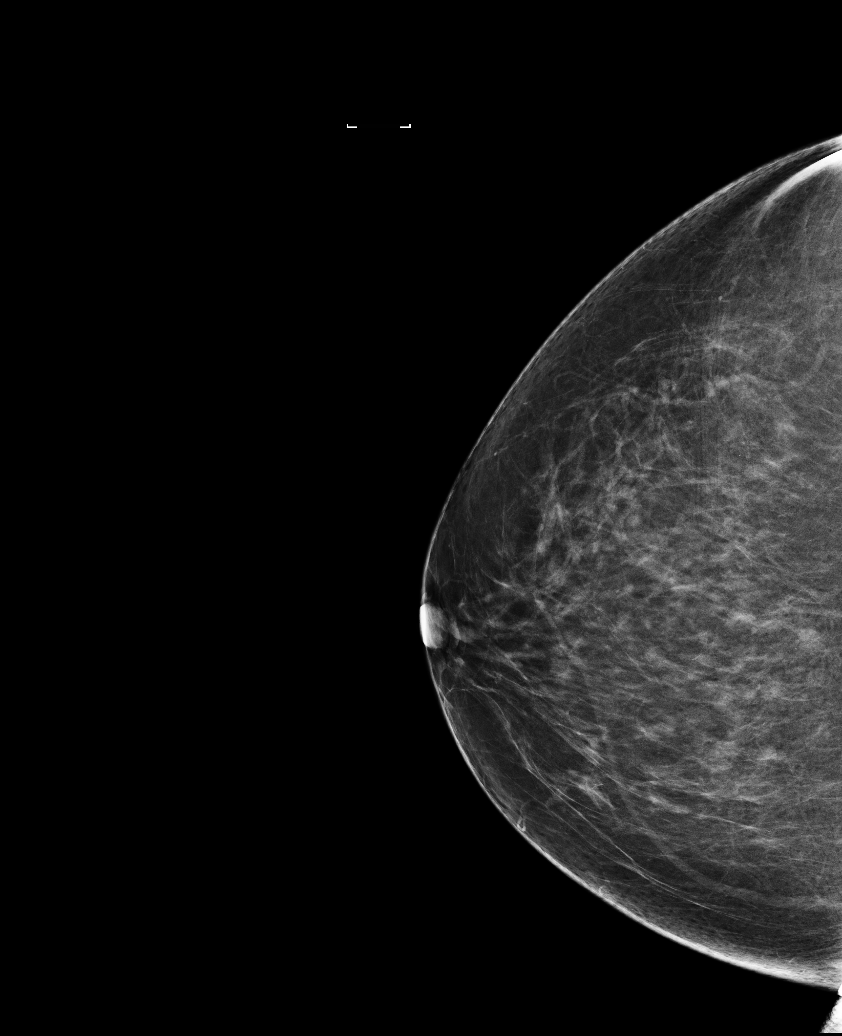

[R MLO (2 of 2)]
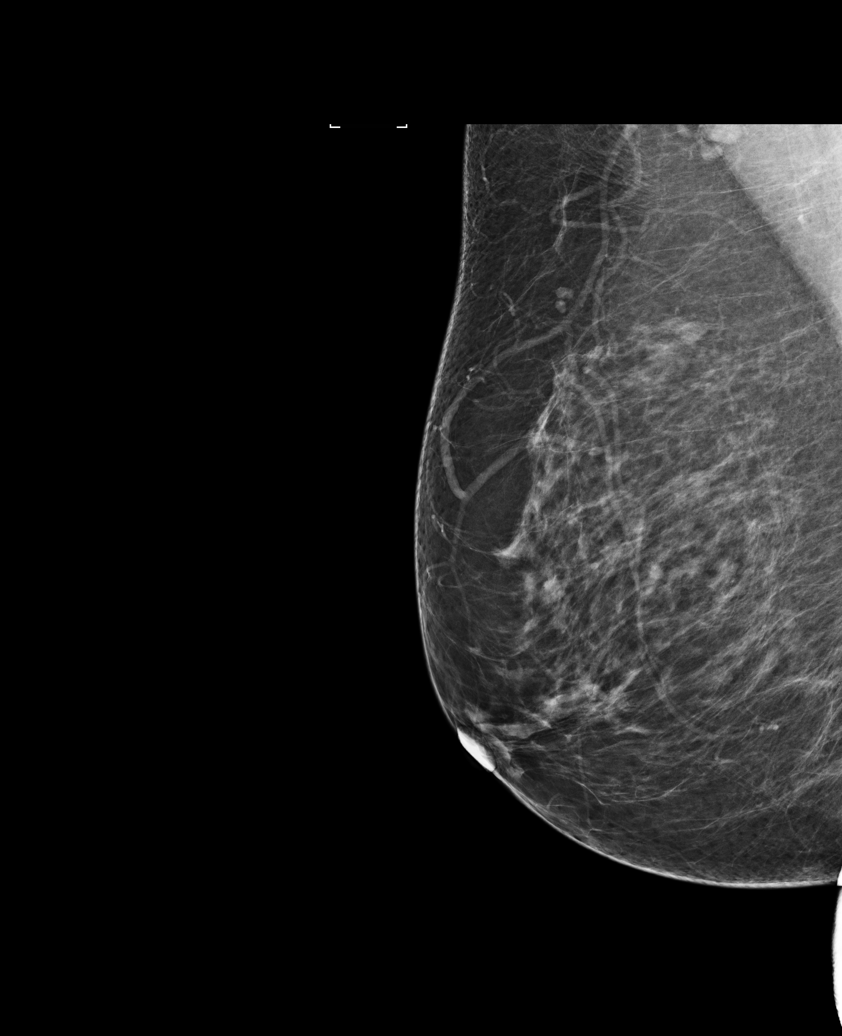

[L CC]
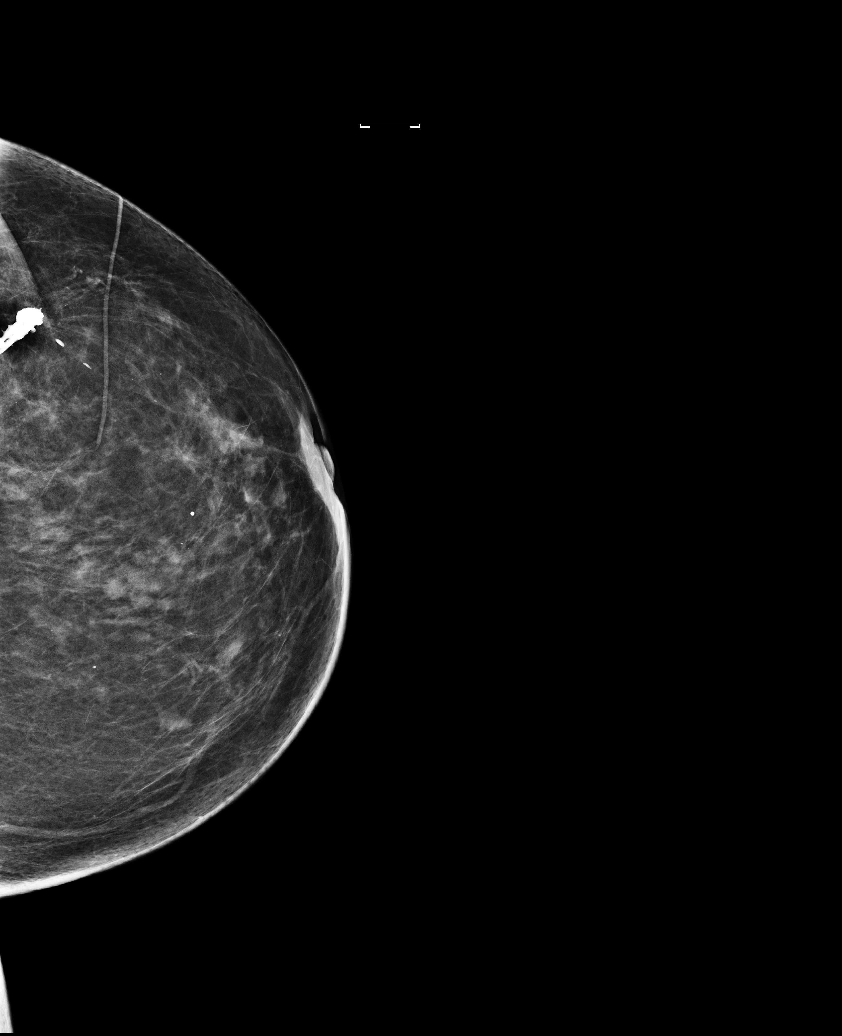

[5 of 5 positions shown; findings below may reference images not displayed]

ACR Breast Density Category b: There are scattered areas of
fibroglandular density.
FINDINGS: There are no findings suspicious for malignancy. Images were
processed with CAD.
IMPRESSION: No mammographic evidence of malignancy. A result letter of this
screening mammogram will be mailed directly to the patient.

RECOMMENDATION:
Screening mammogram in one year. (Code:AS-G-LCT)

BI-RADS CATEGORY  1: Negative.

## 2019-04-08 NOTE — Telephone Encounter (Signed)
Message left for patient to return my call.  

## 2019-04-11 MED ORDER — LISINOPRIL 20 MG PO TABS: 20.0000 mg | ORAL_TABLET | Freq: Every day | ORAL | 0 refills | Status: DC

## 2019-04-11 NOTE — Telephone Encounter (Signed)
Spoken and notified patient of Kaitlyn Millers comments. Patient stated that the Zoloft is making her nausea with diarrhea at times. She feels like it does get into effective until late in the day when she takes it in the morning. She stated that she will give it one more week but not very hopeful it will make a difference. Patient is doing okay on the reduce Xanax, she trying not to use it.  Also patient wanted to mention that her BP have been in 150s-140s over 80s-70s. She does not want to go on the Amlodipine since the ankle swelling improved so much. Please advise.

## 2019-04-11 NOTE — Telephone Encounter (Signed)
Noted.  Please notify patient that I will send a prescription for lisinopril 20 mg for her blood pressure.  This is taken once daily. She can try taking the Zoloft medication at night to see if she has better effects during the daytime hours. We will need to see her back for a blood pressure check with lab in 2 to 3 weeks.  Please schedule.

## 2019-04-12 NOTE — Telephone Encounter (Signed)
Spoken and notified patient of Kaitlyn Millers comments. Patient verbalized understanding.  Follow up appointment on 05/02/2019

## 2019-05-02 ENCOUNTER — Encounter: Payer: Self-pay | Admitting: Primary Care

## 2019-05-02 ENCOUNTER — Ambulatory Visit (INDEPENDENT_AMBULATORY_CARE_PROVIDER_SITE_OTHER): Payer: Medicare HMO | Admitting: Primary Care

## 2019-05-02 ENCOUNTER — Other Ambulatory Visit: Payer: Self-pay

## 2019-05-02 VITALS — BP 168/70 | HR 86 | Temp 98.2°F | Ht 65.0 in | Wt 178.5 lb

## 2019-05-02 DIAGNOSIS — I10 Essential (primary) hypertension: Secondary | ICD-10-CM | POA: Diagnosis not present

## 2019-05-02 DIAGNOSIS — F411 Generalized anxiety disorder: Secondary | ICD-10-CM

## 2019-05-02 DIAGNOSIS — R69 Illness, unspecified: Secondary | ICD-10-CM | POA: Diagnosis not present

## 2019-05-02 DIAGNOSIS — J309 Allergic rhinitis, unspecified: Secondary | ICD-10-CM

## 2019-05-02 MED ORDER — HYDROXYZINE HCL 10 MG PO TABS: ORAL_TABLET | ORAL | 0 refills | Status: DC

## 2019-05-02 MED ORDER — ESCITALOPRAM OXALATE 10 MG PO TABS: 10.0000 mg | ORAL_TABLET | Freq: Every day | ORAL | 1 refills | Status: DC

## 2019-05-02 NOTE — Assessment & Plan Note (Signed)
No improvement on sertraline, also with side effects of nausea and GI upset. Stop Zoloft, switch to Lexapro.  Commended her on no Xanax use, this was removed from her list. Low dose hydroxyzine provided to use PRN for anxiety/panic attacks, drowsiness precautions provided.  She will update in 4 weeks in regards to Lexapro.

## 2019-05-02 NOTE — Assessment & Plan Note (Signed)
Chronic with post nasal drip. Discussed use of hydroxyzine vs Zyrtec. She will update.

## 2019-05-02 NOTE — Assessment & Plan Note (Signed)
Above goal today, feeling anxious. Home readings are stable. Continue lisinopril 20 mg for now. She will continue to monitor.

## 2019-05-02 NOTE — Progress Notes (Signed)
Subjective:    Patient ID: ABBIEGAIL Keller, female    DOB: [birth year], 78 y.o.   MRN: [id number]  HPI  [name] is a 78 year old female who presents today for follow up of anxiety.   She is currently managed on sertraline 25 mg which was initiated in early June 2020 for anxiety. At that point she had reduced alprazolam use to 0.25 mg daily which is a decrease from 0.5 mg twice daily. We initiated sertraline in an attempt to eliminate need for alprazolam.  Since her last visit she's feeling nauseated most of the time, also with diarrhea daily since she began sertraline. She will have to take Imodium for diarrhea regularly. No improvement with anxiety overall. She has not taken any alprazolam since early June 2020. Denies SI/HI.   BP Readings from Last 3 Encounters:  05/02/19 (!) 168/70  03/09/19 122/82  10/05/18 132/72   She is checking her BP at home which is running 120-130/60-70's. Switched from Amlodipine to Lisinopril several weeks ago due to ankle edema. The ankle edema has resolved.   Review of Systems  HENT: Positive for postnasal drip.   Respiratory: Negative for shortness of breath.   Cardiovascular: Negative for chest pain.  Gastrointestinal: Positive for diarrhea and nausea.  Allergic/Immunologic: Positive for environmental allergies.  Psychiatric/Behavioral:       See HPI       Past Medical History:  Diagnosis Date  . Adenocarcinoma, breast ([place])   . Anxiety   . Colonic polyp    5 mm adenoma 1977  . Degenerative joint disease   . Fibromyalgia   . GERD (gastroesophageal reflux disease)   . Hemorrhoids   . History of anemia   . History of shingles   . Hyperlipidemia   . Hypertension   . Hypothyroidism   . IBS (irritable bowel syndrome)   . NASH (nonalcoholic steatohepatitis)   . Osteopenia   . Personal history of chemotherapy 2005   Left Breast Cancer  . Personal history of radiation therapy 2005   Left Breast Cancer     Social History    Socioeconomic History  . Marital status: Married    Spouse name: [name]  . Number of children: 2  . Years of education: Not on file  . Highest education level: Not on file  Occupational History  . Occupation: retired  Scientific laboratory technician  . Financial resource strain: Not on file  . Food insecurity    Worry: Not on file    Inability: Not on file  . Transportation needs    Medical: Not on file    Non-medical: Not on file  Tobacco Use  . Smoking status: Former Smoker    Packs/day: 0.20    Years: 7.00    Pack years: 1.40    Types: Cigarettes    Quit date: 09/29/1970    Years since quitting: 48.6  . Smokeless tobacco: Never Used  . Tobacco comment: 1 pack per week  Substance and Sexual Activity  . Alcohol use: Not Currently  . Drug use: No  . Sexual activity: Yes  Lifestyle  . Physical activity    Days per week: Not on file    Minutes per session: Not on file  . Stress: Not on file  Relationships  . Social Herbalist on phone: Not on file    Gets together: Not on file    Attends religious service: Not on file    Active member of club  or organization: Not on file    Attends meetings of clubs or organizations: Not on file    Relationship status: Not on file  . Intimate partner violence    Fear of current or ex partner: Not on file    Emotionally abused: Not on file    Physically abused: Not on file    Forced sexual activity: Not on file  Other Topics Concern  . Not on file  Social History Narrative   Would desire CPR    Past Surgical History:  Procedure Laterality Date  . ABDOMINAL HYSTERECTOMY    . APPENDECTOMY    . BILATERAL SALPINGOOPHORECTOMY  1978  . BREAST LUMPECTOMY Left 2005   left--node dissection 7/05 by Dr. Charlynne Pander  . LAPAROSCOPIC CHOLECYSTECTOMY    . ROTATOR CUFF REPAIR     left    Family History  Problem Relation Age of Onset  . Prostate cancer Father   . Prostate cancer Brother   . Stroke Mother   . Aneurysm Brother   . Emphysema  Sister   . Crohn's disease Sister   . Liver disease Brother   . Hypertension Brother     Allergies  Allergen Reactions  . Ciprofloxacin Diarrhea  . Codeine     REACTION: nausea and vomiting    Current Outpatient Medications on File Prior to Visit  Medication Sig Dispense Refill  . aspirin (ADULT ASPIRIN EC LOW STRENGTH) 81 MG EC tablet Take 81 mg by mouth daily.      Marland Kitchen levothyroxine (SYNTHROID) 75 MCG tablet TAKE 1 TABLET BY MOUTH EVERY DAY 90 tablet 2  . lisinopril (ZESTRIL) 20 MG tablet Take 1 tablet (20 mg total) by mouth daily. For blood pressure. 30 tablet 0  . omeprazole (PRILOSEC) 40 MG capsule TAKE 1 CAPSULE BY MOUTH EVERY DAY for heartburn. 90 capsule 3  . sertraline (ZOLOFT) 25 MG tablet TAKE 1 TABLET (25 MG TOTAL) BY MOUTH DAILY. FOR ANXIETY. 90 tablet 0   No current facility-administered medications on file prior to visit.     BP (!) 168/70   Pulse 86   Temp 98.2 F (36.8 C) (Temporal)   Ht 5\' 5"  (1.651 m)   Wt 178 lb 8 oz (81 kg)   SpO2 97%   BMI 29.70 kg/m    Objective:   Physical Exam  Constitutional: She appears well-nourished.  Neck: Neck supple.  Cardiovascular: Normal rate and regular rhythm.  Respiratory: Effort normal and breath sounds normal.  Skin: Skin is warm and dry.  Psychiatric: She has a normal mood and affect.           Assessment & Plan:

## 2019-05-02 NOTE — Patient Instructions (Signed)
Stop taking sertraline (Zoloft) for anxiety. Start taking escitalopram (Lexapro) for anxiety.  You may take the hydroxyzine as needed for panic attacks/anxiety. This may cause drowsiness.  You can try Zyrtec at bedtime for allergies/throat drainage.  Please update me in 4 weeks in regards to your anxiety. Please call me in two weeks if your symptoms of nausea and diarrhea do not improve.  It was a pleasure to see you today!

## 2019-05-04 ENCOUNTER — Other Ambulatory Visit: Payer: Self-pay | Admitting: Primary Care

## 2019-05-04 DIAGNOSIS — I10 Essential (primary) hypertension: Secondary | ICD-10-CM

## 2019-05-24 ENCOUNTER — Other Ambulatory Visit: Payer: Self-pay | Admitting: Primary Care

## 2019-05-24 DIAGNOSIS — F411 Generalized anxiety disorder: Secondary | ICD-10-CM

## 2019-05-28 ENCOUNTER — Other Ambulatory Visit: Payer: Self-pay | Admitting: Primary Care

## 2019-05-28 DIAGNOSIS — I10 Essential (primary) hypertension: Secondary | ICD-10-CM

## 2019-06-30 ENCOUNTER — Other Ambulatory Visit: Payer: Self-pay | Admitting: Primary Care

## 2019-06-30 DIAGNOSIS — F411 Generalized anxiety disorder: Secondary | ICD-10-CM

## 2019-08-27 ENCOUNTER — Other Ambulatory Visit: Payer: Self-pay | Admitting: Primary Care

## 2019-08-27 DIAGNOSIS — F411 Generalized anxiety disorder: Secondary | ICD-10-CM

## 2019-08-29 ENCOUNTER — Other Ambulatory Visit: Payer: Self-pay | Admitting: Primary Care

## 2019-08-29 DIAGNOSIS — I10 Essential (primary) hypertension: Secondary | ICD-10-CM

## 2019-08-29 DIAGNOSIS — R69 Illness, unspecified: Secondary | ICD-10-CM | POA: Diagnosis not present

## 2019-08-29 DIAGNOSIS — F411 Generalized anxiety disorder: Secondary | ICD-10-CM

## 2019-08-30 NOTE — Telephone Encounter (Signed)
Pt called checking on her rx  Lisinopril escitalopram  [facility]  Pt is out of escitalopram  Best number [phone]

## 2019-08-30 NOTE — Telephone Encounter (Signed)
Spoken to patient and refill has been sent.

## 2019-08-30 NOTE — Telephone Encounter (Signed)
Pt left v/m requesting cb from Gentry Fitz NP CMA to ck on status of refills.

## 2019-09-19 DIAGNOSIS — I10 Essential (primary) hypertension: Secondary | ICD-10-CM | POA: Diagnosis not present

## 2019-09-19 DIAGNOSIS — E039 Hypothyroidism, unspecified: Secondary | ICD-10-CM | POA: Diagnosis not present

## 2019-09-19 DIAGNOSIS — K219 Gastro-esophageal reflux disease without esophagitis: Secondary | ICD-10-CM | POA: Diagnosis not present

## 2019-09-19 DIAGNOSIS — R69 Illness, unspecified: Secondary | ICD-10-CM | POA: Diagnosis not present

## 2019-09-19 DIAGNOSIS — Z809 Family history of malignant neoplasm, unspecified: Secondary | ICD-10-CM | POA: Diagnosis not present

## 2019-09-19 DIAGNOSIS — Z823 Family history of stroke: Secondary | ICD-10-CM | POA: Diagnosis not present

## 2019-09-19 DIAGNOSIS — Z853 Personal history of malignant neoplasm of breast: Secondary | ICD-10-CM | POA: Diagnosis not present

## 2019-09-19 DIAGNOSIS — Z825 Family history of asthma and other chronic lower respiratory diseases: Secondary | ICD-10-CM | POA: Diagnosis not present

## 2019-09-19 DIAGNOSIS — Z87891 Personal history of nicotine dependence: Secondary | ICD-10-CM | POA: Diagnosis not present

## 2019-09-19 DIAGNOSIS — Z8249 Family history of ischemic heart disease and other diseases of the circulatory system: Secondary | ICD-10-CM | POA: Diagnosis not present

## 2019-11-26 ENCOUNTER — Other Ambulatory Visit: Payer: Self-pay | Admitting: Primary Care

## 2019-11-26 DIAGNOSIS — R69 Illness, unspecified: Secondary | ICD-10-CM | POA: Diagnosis not present

## 2019-11-26 DIAGNOSIS — E039 Hypothyroidism, unspecified: Secondary | ICD-10-CM

## 2020-02-21 ENCOUNTER — Other Ambulatory Visit: Payer: Self-pay | Admitting: Primary Care

## 2020-02-21 DIAGNOSIS — F411 Generalized anxiety disorder: Secondary | ICD-10-CM

## 2020-02-22 ENCOUNTER — Other Ambulatory Visit: Payer: Self-pay | Admitting: Primary Care

## 2020-02-22 DIAGNOSIS — I10 Essential (primary) hypertension: Secondary | ICD-10-CM

## 2020-02-22 DIAGNOSIS — E039 Hypothyroidism, unspecified: Secondary | ICD-10-CM

## 2020-02-22 DIAGNOSIS — K219 Gastro-esophageal reflux disease without esophagitis: Secondary | ICD-10-CM

## 2020-04-02 ENCOUNTER — Other Ambulatory Visit: Payer: Self-pay | Admitting: Primary Care

## 2020-04-02 DIAGNOSIS — F411 Generalized anxiety disorder: Secondary | ICD-10-CM

## 2020-04-02 DIAGNOSIS — K219 Gastro-esophageal reflux disease without esophagitis: Secondary | ICD-10-CM

## 2020-04-09 ENCOUNTER — Other Ambulatory Visit: Payer: Self-pay | Admitting: Primary Care

## 2020-04-09 DIAGNOSIS — E785 Hyperlipidemia, unspecified: Secondary | ICD-10-CM

## 2020-04-09 DIAGNOSIS — E039 Hypothyroidism, unspecified: Secondary | ICD-10-CM

## 2020-04-09 DIAGNOSIS — I10 Essential (primary) hypertension: Secondary | ICD-10-CM

## 2020-04-18 ENCOUNTER — Encounter: Payer: Medicare HMO | Admitting: Primary Care

## 2020-04-20 ENCOUNTER — Other Ambulatory Visit: Payer: Self-pay

## 2020-04-20 ENCOUNTER — Other Ambulatory Visit (INDEPENDENT_AMBULATORY_CARE_PROVIDER_SITE_OTHER): Payer: Medicare HMO

## 2020-04-20 ENCOUNTER — Telehealth: Payer: Self-pay

## 2020-04-20 ENCOUNTER — Ambulatory Visit: Payer: Medicare HMO

## 2020-04-20 DIAGNOSIS — E785 Hyperlipidemia, unspecified: Secondary | ICD-10-CM | POA: Diagnosis not present

## 2020-04-20 DIAGNOSIS — I10 Essential (primary) hypertension: Secondary | ICD-10-CM | POA: Diagnosis not present

## 2020-04-20 DIAGNOSIS — E039 Hypothyroidism, unspecified: Secondary | ICD-10-CM | POA: Diagnosis not present

## 2020-04-20 LAB — COMPREHENSIVE METABOLIC PANEL
ALT: 22 U/L (ref 0–35)
AST: 34 U/L (ref 0–37)
Albumin: 3.5 g/dL (ref 3.5–5.2)
Alkaline Phosphatase: 84 U/L (ref 39–117)
BUN: 11 mg/dL (ref 6–23)
CO2: 24 mEq/L (ref 19–32)
Calcium: 9.8 mg/dL (ref 8.4–10.5)
Chloride: 105 mEq/L (ref 96–112)
Creatinine, Ser: 0.97 mg/dL (ref 0.40–1.20)
GFR: 55.23 mL/min — ABNORMAL LOW (ref >60.00)
Glucose, Bld: 96 mg/dL (ref 70–99)
Potassium: 4.2 mEq/L (ref 3.5–5.1)
Sodium: 137 mEq/L (ref 135–145)
Total Bilirubin: 0.8 mg/dL (ref 0.2–1.2)
Total Protein: 6.8 g/dL (ref 6.0–8.3)

## 2020-04-20 LAB — TSH: TSH: 3.64 u[IU]/mL (ref 0.35–4.50)

## 2020-04-20 LAB — CBC
HCT: 38.1 % (ref 36.0–46.0)
Hemoglobin: 12.7 g/dL (ref 12.0–15.0)
MCHC: 33.4 g/dL (ref 30.0–36.0)
MCV: 87.4 fl (ref 78.0–100.0)
Platelets: 157 10*3/uL (ref 150.0–400.0)
RBC: 4.35 Mil/uL (ref 3.87–5.11)
RDW: 15.4 % (ref 11.5–15.5)
WBC: 6 10*3/uL (ref 4.0–10.5)

## 2020-04-20 LAB — LIPID PANEL
Cholesterol: 182 mg/dL (ref 0–200)
HDL: 54.3 mg/dL (ref >39.00)
LDL Cholesterol: 105 mg/dL — ABNORMAL HIGH (ref 0–99)
NonHDL: 127.67
Total CHOL/HDL Ratio: 3
Triglycerides: 115 mg/dL (ref 0.0–149.0)
VLDL: 23 mg/dL (ref 0.0–40.0)

## 2020-04-20 NOTE — Telephone Encounter (Signed)
Called patient 3 times trying to complete her Medicare visit. Patient never answered. Left message on voicemail notifying patient appointment was cancelled and to call the office to reschedule or complete at upcoming physical.

## 2020-04-23 ENCOUNTER — Other Ambulatory Visit: Payer: Self-pay

## 2020-04-23 ENCOUNTER — Encounter: Payer: Self-pay | Admitting: Primary Care

## 2020-04-23 ENCOUNTER — Ambulatory Visit (INDEPENDENT_AMBULATORY_CARE_PROVIDER_SITE_OTHER): Payer: Medicare HMO | Admitting: Primary Care

## 2020-04-23 VITALS — BP 134/74 | HR 88 | Temp 95.9°F | Ht 65.0 in | Wt 183.2 lb

## 2020-04-23 DIAGNOSIS — E2839 Other primary ovarian failure: Secondary | ICD-10-CM | POA: Diagnosis not present

## 2020-04-23 DIAGNOSIS — E038 Other specified hypothyroidism: Secondary | ICD-10-CM

## 2020-04-23 DIAGNOSIS — K589 Irritable bowel syndrome without diarrhea: Secondary | ICD-10-CM | POA: Diagnosis not present

## 2020-04-23 DIAGNOSIS — E785 Hyperlipidemia, unspecified: Secondary | ICD-10-CM | POA: Diagnosis not present

## 2020-04-23 DIAGNOSIS — K219 Gastro-esophageal reflux disease without esophagitis: Secondary | ICD-10-CM | POA: Diagnosis not present

## 2020-04-23 DIAGNOSIS — Z1231 Encounter for screening mammogram for malignant neoplasm of breast: Secondary | ICD-10-CM | POA: Diagnosis not present

## 2020-04-23 DIAGNOSIS — C50019 Malignant neoplasm of nipple and areola, unspecified female breast: Secondary | ICD-10-CM

## 2020-04-23 DIAGNOSIS — Z23 Encounter for immunization: Secondary | ICD-10-CM | POA: Diagnosis not present

## 2020-04-23 DIAGNOSIS — Z0000 Encounter for general adult medical examination without abnormal findings: Secondary | ICD-10-CM

## 2020-04-23 DIAGNOSIS — I10 Essential (primary) hypertension: Secondary | ICD-10-CM

## 2020-04-23 DIAGNOSIS — F411 Generalized anxiety disorder: Secondary | ICD-10-CM

## 2020-04-23 DIAGNOSIS — Z86010 Personal history of colonic polyps: Secondary | ICD-10-CM

## 2020-04-23 MED ORDER — ZOSTER VAC RECOMB ADJUVANTED 50 MCG/0.5ML IM SUSR: 0.5000 mL | Freq: Once | INTRAMUSCULAR | 1 refills | Status: AC

## 2020-04-23 NOTE — Progress Notes (Signed)
Subjective:    Patient ID: [name], female    DOB: May 01, 1941, 79 y.o.   MRN: [id number]  HPI  This visit occurred during the SARS-CoV-2 public health emergency.  Safety protocols were in place, including screening questions prior to the visit, additional usage of staff PPE, and extensive cleaning of exam room while observing appropriate contact time as indicated for disinfecting solutions.   [name] is a 79 year old female who presents today for complete physical.  Immunizations: -Tetanus: Completed in 2012 -Influenza: Declines  -Shingles: Never completed, due  -Pneumonia: Never completed, due -Covid-19: Declines   Diet: She endorses a fair diet Exercise: She is active  Eye exam: No recent exam, she will schedule  Dental exam: No recent exam   Mammogram: Completed in 2019 Dexa: Completed in 2019 Colonoscopy: Completed in 2011, N/A now due to age Hep C Screen: Negative   BP Readings from Last 3 Encounters:  04/23/20 (!) 134/74  05/02/19 (!) 168/70  03/09/19 122/82     Review of Systems  Constitutional: Negative for unexpected weight change.  HENT: Negative for rhinorrhea.   Respiratory: Negative for cough and shortness of breath.   Cardiovascular: Negative for chest pain.  Gastrointestinal: Negative for constipation and diarrhea.  Genitourinary: Negative for difficulty urinating.  Musculoskeletal: Negative for arthralgias and myalgias.  Skin: Negative for rash.  Allergic/Immunologic: Positive for environmental allergies.  Neurological: Negative for dizziness, numbness and headaches.  Psychiatric/Behavioral: The patient is not nervous/anxious.        Past Medical History:  Diagnosis Date  . Adenocarcinoma, breast ([place])   . Anxiety   . Colonic polyp    5 mm adenoma 1977  . Degenerative joint disease   . Fibromyalgia   . GERD (gastroesophageal reflux disease)   . Hemorrhoids   . History of anemia   . History of shingles   . Hyperlipidemia     . Hypertension   . Hypothyroidism   . IBS (irritable bowel syndrome)   . NASH (nonalcoholic steatohepatitis)   . Osteopenia   . Personal history of chemotherapy 2005   Left Breast Cancer  . Personal history of radiation therapy 2005   Left Breast Cancer     Social History   Socioeconomic History  . Marital status: Married    Spouse name: [name]  . Number of children: 2  . Years of education: Not on file  . Highest education level: Not on file  Occupational History  . Occupation: retired  Tobacco Use  . Smoking status: Former Smoker    Packs/day: 0.20    Years: 7.00    Pack years: 1.40    Types: Cigarettes    Quit date: 09/29/1970    Years since quitting: 49.6  . Smokeless tobacco: Never Used  . Tobacco comment: 1 pack per week  Vaping Use  . Vaping Use: Never used  Substance and Sexual Activity  . Alcohol use: Not Currently  . Drug use: No  . Sexual activity: Yes  Other Topics Concern  . Not on file  Social History Narrative   Would desire CPR   Social Determinants of Health   Financial Resource Strain:   . Difficulty of Paying Living Expenses:   Food Insecurity:   . Worried About Charity fundraiser in the Last Year:   . Arboriculturist in the Last Year:   Transportation Needs:   . Film/video editor (Medical):   Marland Kitchen Lack of Transportation (Non-Medical):  Physical Activity:   . Days of Exercise per Week:   . Minutes of Exercise per Session:   Stress:   . Feeling of Stress :   Social Connections:   . Frequency of Communication with Friends and Family:   . Frequency of Social Gatherings with Friends and Family:   . Attends Religious Services:   . Active Member of Clubs or Organizations:   . Attends Archivist Meetings:   Marland Kitchen Marital Status:   Intimate Partner Violence:   . Fear of Current or Ex-Partner:   . Emotionally Abused:   Marland Kitchen Physically Abused:   . Sexually Abused:     Past Surgical History:  Procedure Laterality Date  .  ABDOMINAL HYSTERECTOMY    . APPENDECTOMY    . BILATERAL SALPINGOOPHORECTOMY  1978  . BREAST LUMPECTOMY Left 2005   left--node dissection 7/05 by Dr. Charlynne Pander  . LAPAROSCOPIC CHOLECYSTECTOMY    . ROTATOR CUFF REPAIR     left    Family History  Problem Relation Age of Onset  . Prostate cancer Father   . Prostate cancer Brother   . Stroke Mother   . Aneurysm Brother   . Emphysema Sister   . Crohn's disease Sister   . Liver disease Brother   . Hypertension Brother     Allergies  Allergen Reactions  . Ciprofloxacin Diarrhea  . Codeine     REACTION: nausea and vomiting    Current Outpatient Medications on File Prior to Visit  Medication Sig Dispense Refill  . aspirin (ADULT ASPIRIN EC LOW STRENGTH) 81 MG EC tablet Take 81 mg by mouth daily.      Marland Kitchen escitalopram (LEXAPRO) 10 MG tablet TAKE 1 TABLET (10 MG TOTAL) BY MOUTH DAILY. FOR ANXIETY. 90 tablet 0  . hydrOXYzine (ATARAX/VISTARIL) 10 MG tablet TAKE 1 TABLET BY MOUTH EVERY DAY AS NEEDED FOR ANXIETY 90 tablet 0  . levothyroxine (SYNTHROID) 75 MCG tablet TAKE 1 TABLET BY MOUTH EVERY DAY 90 tablet 0  . lisinopril (ZESTRIL) 20 MG tablet TAKE 1 TABLET (20 MG TOTAL) BY MOUTH DAILY. FOR BLOOD PRESSURE. 90 tablet 0  . omeprazole (PRILOSEC) 40 MG capsule TAKE 1 CAPSULE BY MOUTH EVERY DAY FOR HEARTBURN. 90 capsule 0   No current facility-administered medications on file prior to visit.    BP (!) 134/74   Pulse 88   Temp (!) 95.9 F (35.5 C) (Temporal)   Ht 5\' 5"  (1.651 m)   Wt 183 lb 4 oz (83.1 kg)   SpO2 98%   BMI 30.49 kg/m    Objective:   Physical Exam HENT:     Right Ear: Tympanic membrane and ear canal normal.     Left Ear: Tympanic membrane and ear canal normal.  Eyes:     Pupils: Pupils are equal, round, and reactive to light.  Cardiovascular:     Rate and Rhythm: Normal rate and regular rhythm.  Pulmonary:     Effort: Pulmonary effort is normal.     Breath sounds: Normal breath sounds.  Abdominal:     General:  Bowel sounds are normal.     Palpations: Abdomen is soft.     Tenderness: There is no abdominal tenderness.  Musculoskeletal:        General: Normal range of motion.     Cervical back: Neck supple.  Skin:    General: Skin is warm and dry.  Neurological:     Mental Status: She is alert and oriented to person, place,  and time.     Cranial Nerves: No cranial nerve deficit.     Deep Tendon Reflexes:     Reflex Scores:      Patellar reflexes are 2+ on the right side and 2+ on the left side. Psychiatric:        Mood and Affect: Mood normal.            Assessment & Plan:

## 2020-04-23 NOTE — Assessment & Plan Note (Signed)
Stable in the office today, continue lisinopril. CMP reviewed.

## 2020-04-23 NOTE — Assessment & Plan Note (Signed)
Received letter from GI center, no further imaging required given age. Both patient and I agree.

## 2020-04-23 NOTE — Assessment & Plan Note (Signed)
She is taking levothyroxine correctly, recent TSH stable. Continue levothyroxine 75 mcg.

## 2020-04-23 NOTE — Assessment & Plan Note (Signed)
Lipids stable off of statin therapy, continue to monitor off medication.

## 2020-04-23 NOTE — Assessment & Plan Note (Signed)
Overall doing well on omeprazole 40 mg, intermittent flares.  Continue same.

## 2020-04-23 NOTE — Assessment & Plan Note (Signed)
Intermittent, overall stable. Continue to monitor.

## 2020-04-23 NOTE — Patient Instructions (Signed)
Continue exercising. You should be getting 150 minutes of moderate intensity exercise weekly.  Continue to work on a healthy diet. Drink plenty of water daily.  Take the shingles vaccine to the pharmacy in one month or later.  Call the breast center to schedule your mammogram and bone density tests.  It was a pleasure to see you today!   Preventive Care 7 Years and Older, Female Preventive care refers to lifestyle choices and visits with your health care provider that can promote health and wellness. This includes:  A yearly physical exam. This is also called an annual well check.  Regular dental and eye exams.  Immunizations.  Screening for certain conditions.  Healthy lifestyle choices, such as diet and exercise. What can I expect for my preventive care visit? Physical exam Your health care provider will check:  Height and weight. These may be used to calculate body mass index (BMI), which is a measurement that tells if you are at a healthy weight.  Heart rate and blood pressure.  Your skin for abnormal spots. Counseling Your health care provider may ask you questions about:  Alcohol, tobacco, and drug use.  Emotional well-being.  Home and relationship well-being.  Sexual activity.  Eating habits.  History of falls.  Memory and ability to understand (cognition).  Work and work Statistician.  Pregnancy and menstrual history. What immunizations do I need?  Influenza (flu) vaccine  This is recommended every year. Tetanus, diphtheria, and pertussis (Tdap) vaccine  You may need a Td booster every 10 years. Varicella (chickenpox) vaccine  You may need this vaccine if you have not already been vaccinated. Zoster (shingles) vaccine  You may need this after age 53. Pneumococcal conjugate (PCV13) vaccine  One dose is recommended after age 62. Pneumococcal polysaccharide (PPSV23) vaccine  One dose is recommended after age 75. Measles, mumps, and rubella  (MMR) vaccine  You may need at least one dose of MMR if you were born in 1957 or later. You may also need a second dose. Meningococcal conjugate (MenACWY) vaccine  You may need this if you have certain conditions. Hepatitis A vaccine  You may need this if you have certain conditions or if you travel or work in places where you may be exposed to hepatitis A. Hepatitis B vaccine  You may need this if you have certain conditions or if you travel or work in places where you may be exposed to hepatitis B. Haemophilus influenzae type b (Hib) vaccine  You may need this if you have certain conditions. You may receive vaccines as individual doses or as more than one vaccine together in one shot (combination vaccines). Talk with your health care provider about the risks and benefits of combination vaccines. What tests do I need? Blood tests  Lipid and cholesterol levels. These may be checked every 5 years, or more frequently depending on your overall health.  Hepatitis C test.  Hepatitis B test. Screening  Lung cancer screening. You may have this screening every year starting at age 9 if you have a 30-pack-year history of smoking and currently smoke or have quit within the past 15 years.  Colorectal cancer screening. All adults should have this screening starting at age 74 and continuing until age 36. Your health care provider may recommend screening at age 31 if you are at increased risk. You will have tests every 1-10 years, depending on your results and the type of screening test.  Diabetes screening. This is done by checking your blood  sugar (glucose) after you have not eaten for a while (fasting). You may have this done every 1-3 years.  Mammogram. This may be done every 1-2 years. Talk with your health care provider about how often you should have regular mammograms.  BRCA-related cancer screening. This may be done if you have a family history of breast, ovarian, tubal, or peritoneal  cancers. Other tests  Sexually transmitted disease (STD) testing.  Bone density scan. This is done to screen for osteoporosis. You may have this done starting at age 98. Follow these instructions at home: Eating and drinking  Eat a diet that includes fresh fruits and vegetables, whole grains, lean protein, and low-fat dairy products. Limit your intake of foods with high amounts of sugar, saturated fats, and salt.  Take vitamin and mineral supplements as recommended by your health care provider.  Do not drink alcohol if your health care provider tells you not to drink.  If you drink alcohol: ? Limit how much you have to 0-1 drink a day. ? Be aware of how much alcohol is in your drink. In the [place] one drink equals one 12 oz bottle of beer (355 mL), one 5 oz glass of wine (148 mL), or one 1 oz glass of hard liquor (44 mL). Lifestyle  Take daily care of your teeth and gums.  Stay active. Exercise for at least 30 minutes on 5 or more days each week.  Do not use any products that contain nicotine or tobacco, such as cigarettes, e-cigarettes, and chewing tobacco. If you need help quitting, ask your health care provider.  If you are sexually active, practice safe sex. Use a condom or other form of protection in order to prevent STIs (sexually transmitted infections).  Talk with your health care provider about taking a low-dose aspirin or statin. What's next?  Go to your health care provider once a year for a well check visit.  Ask your health care provider how often you should have your eyes and teeth checked.  Stay up to date on all vaccines. This information is not intended to replace advice given to you by your health care provider. Make sure you discuss any questions you have with your health care provider. Document Revised: 09/09/2018 Document Reviewed: 09/09/2018 Elsevier Patient Education  2020 [employer].

## 2020-04-23 NOTE — Assessment & Plan Note (Signed)
Due for Pneumonia and Shingles vaccines. Prevnar 13 provided today, Pneumovax due in 2022. Rx for Shingrix provided to take to pharmacy.   Mammogram and bone density scan due, orders placed. Colonoscopy N/A given age.  Encouraged a healthy diet, regular exercise. Exam today stable. Labs reviewed.

## 2020-04-23 NOTE — Assessment & Plan Note (Signed)
Doing well on Lexapro 10 mg, continue same.

## 2020-04-23 NOTE — Addendum Note (Signed)
Addended by: Jacqualin Combes on: 04/23/2020 03:39 PM   Modules accepted: Orders

## 2020-04-23 NOTE — Assessment & Plan Note (Signed)
Mammogram overdue, orders placed for mammogram and bone density.

## 2020-05-14 DIAGNOSIS — Z791 Long term (current) use of non-steroidal anti-inflammatories (NSAID): Secondary | ICD-10-CM | POA: Diagnosis not present

## 2020-05-14 DIAGNOSIS — I10 Essential (primary) hypertension: Secondary | ICD-10-CM | POA: Diagnosis not present

## 2020-05-14 DIAGNOSIS — K219 Gastro-esophageal reflux disease without esophagitis: Secondary | ICD-10-CM | POA: Diagnosis not present

## 2020-05-14 DIAGNOSIS — Z008 Encounter for other general examination: Secondary | ICD-10-CM | POA: Diagnosis not present

## 2020-05-14 DIAGNOSIS — E039 Hypothyroidism, unspecified: Secondary | ICD-10-CM | POA: Diagnosis not present

## 2020-05-14 DIAGNOSIS — Z6830 Body mass index (BMI) 30.0-30.9, adult: Secondary | ICD-10-CM | POA: Diagnosis not present

## 2020-05-14 DIAGNOSIS — Z809 Family history of malignant neoplasm, unspecified: Secondary | ICD-10-CM | POA: Diagnosis not present

## 2020-05-14 DIAGNOSIS — G8929 Other chronic pain: Secondary | ICD-10-CM | POA: Diagnosis not present

## 2020-05-14 DIAGNOSIS — R69 Illness, unspecified: Secondary | ICD-10-CM | POA: Diagnosis not present

## 2020-05-14 DIAGNOSIS — M1990 Unspecified osteoarthritis, unspecified site: Secondary | ICD-10-CM | POA: Diagnosis not present

## 2020-05-14 DIAGNOSIS — E669 Obesity, unspecified: Secondary | ICD-10-CM | POA: Diagnosis not present

## 2020-05-21 ENCOUNTER — Other Ambulatory Visit: Payer: Self-pay | Admitting: Primary Care

## 2020-05-21 DIAGNOSIS — E039 Hypothyroidism, unspecified: Secondary | ICD-10-CM

## 2020-05-21 DIAGNOSIS — K219 Gastro-esophageal reflux disease without esophagitis: Secondary | ICD-10-CM

## 2020-05-21 DIAGNOSIS — F411 Generalized anxiety disorder: Secondary | ICD-10-CM

## 2020-05-30 ENCOUNTER — Other Ambulatory Visit: Payer: Self-pay | Admitting: Primary Care

## 2020-05-30 DIAGNOSIS — I10 Essential (primary) hypertension: Secondary | ICD-10-CM

## 2020-08-07 ENCOUNTER — Other Ambulatory Visit: Payer: Self-pay

## 2020-08-07 ENCOUNTER — Ambulatory Visit
Admission: RE | Admit: 2020-08-07 | Discharge: 2020-08-07 | Disposition: A | Discharge status: Home / Self Care | Payer: Medicare HMO | Source: Ambulatory Visit | Attending: Primary Care | Admitting: Primary Care

## 2020-08-07 DIAGNOSIS — Z780 Asymptomatic menopausal state: Secondary | ICD-10-CM | POA: Diagnosis not present

## 2020-08-07 DIAGNOSIS — E2839 Other primary ovarian failure: Secondary | ICD-10-CM

## 2020-08-07 DIAGNOSIS — Z1231 Encounter for screening mammogram for malignant neoplasm of breast: Secondary | ICD-10-CM | POA: Diagnosis not present

## 2020-08-07 DIAGNOSIS — M8589 Other specified disorders of bone density and structure, multiple sites: Secondary | ICD-10-CM | POA: Diagnosis not present

## 2020-08-10 ENCOUNTER — Other Ambulatory Visit: Payer: Self-pay | Admitting: Primary Care

## 2020-08-10 DIAGNOSIS — R928 Other abnormal and inconclusive findings on diagnostic imaging of breast: Secondary | ICD-10-CM

## 2020-08-27 ENCOUNTER — Ambulatory Visit
Admission: RE | Admit: 2020-08-27 | Discharge: 2020-08-27 | Disposition: A | Discharge status: Home / Self Care | Payer: Medicare HMO | Source: Ambulatory Visit | Attending: Primary Care | Admitting: Primary Care

## 2020-08-27 ENCOUNTER — Ambulatory Visit: Admission: RE | Admit: 2020-08-27 | Payer: Medicare HMO | Source: Ambulatory Visit

## 2020-08-27 ENCOUNTER — Other Ambulatory Visit: Payer: Self-pay | Admitting: Primary Care

## 2020-08-27 ENCOUNTER — Other Ambulatory Visit: Payer: Self-pay

## 2020-08-27 DIAGNOSIS — Z853 Personal history of malignant neoplasm of breast: Secondary | ICD-10-CM | POA: Diagnosis not present

## 2020-08-27 DIAGNOSIS — R921 Mammographic calcification found on diagnostic imaging of breast: Secondary | ICD-10-CM

## 2020-08-27 DIAGNOSIS — R928 Other abnormal and inconclusive findings on diagnostic imaging of breast: Secondary | ICD-10-CM

## 2020-08-27 DIAGNOSIS — I10 Essential (primary) hypertension: Secondary | ICD-10-CM

## 2020-11-13 ENCOUNTER — Other Ambulatory Visit: Payer: Self-pay | Admitting: Primary Care

## 2020-11-13 DIAGNOSIS — K219 Gastro-esophageal reflux disease without esophagitis: Secondary | ICD-10-CM

## 2020-11-16 ENCOUNTER — Other Ambulatory Visit: Payer: Self-pay | Admitting: Primary Care

## 2020-11-16 DIAGNOSIS — F411 Generalized anxiety disorder: Secondary | ICD-10-CM

## 2020-11-19 ENCOUNTER — Other Ambulatory Visit: Payer: Self-pay | Admitting: Primary Care

## 2020-11-19 DIAGNOSIS — E039 Hypothyroidism, unspecified: Secondary | ICD-10-CM

## 2021-02-26 ENCOUNTER — Other Ambulatory Visit: Payer: Self-pay | Admitting: Primary Care

## 2021-02-26 ENCOUNTER — Other Ambulatory Visit: Payer: Self-pay

## 2021-02-26 ENCOUNTER — Ambulatory Visit
Admission: RE | Admit: 2021-02-26 | Discharge: 2021-02-26 | Disposition: A | Discharge status: Home / Self Care | Payer: Medicare HMO | Source: Ambulatory Visit | Attending: Primary Care | Admitting: Primary Care

## 2021-02-26 DIAGNOSIS — R922 Inconclusive mammogram: Secondary | ICD-10-CM | POA: Diagnosis not present

## 2021-02-26 DIAGNOSIS — R921 Mammographic calcification found on diagnostic imaging of breast: Secondary | ICD-10-CM

## 2021-02-26 DIAGNOSIS — Z853 Personal history of malignant neoplasm of breast: Secondary | ICD-10-CM | POA: Diagnosis not present

## 2021-02-28 ENCOUNTER — Other Ambulatory Visit: Payer: Self-pay | Admitting: Primary Care

## 2021-02-28 DIAGNOSIS — I10 Essential (primary) hypertension: Secondary | ICD-10-CM

## 2021-02-28 DIAGNOSIS — K219 Gastro-esophageal reflux disease without esophagitis: Secondary | ICD-10-CM

## 2021-02-28 DIAGNOSIS — F411 Generalized anxiety disorder: Secondary | ICD-10-CM

## 2021-02-28 NOTE — Telephone Encounter (Signed)
Patient is due for CPE/follow up in early August, this will be required prior to any further refills.   Please schedule.

## 2021-03-04 NOTE — Telephone Encounter (Signed)
Called patient app made for 05/07/2021.

## 2021-04-05 ENCOUNTER — Telehealth: Payer: Self-pay | Admitting: *Deleted

## 2021-04-05 NOTE — Chronic Care Management (AMB) (Signed)
  Chronic Care Management   Outreach Note  04/05/2021 Name: [name] MRN: [id number] DOB: [birth year]  [name] is a 80 y.o. year old female who is a primary care patient of [name], NP. I reached out to [name] by phone today in response to a referral sent by [name]'s PCP [name], NP     An unsuccessful telephone outreach was attempted today. The patient was referred to the case management team for assistance with care management and care coordination.   Follow Up Plan: A HIPAA compliant phone message was left for the patient providing contact information and requesting a return call.  If patient returns call to provider office, please advise to call Embedded Care Management Care Guide [name] at [facility] [place] [facility]  Direct Dial: [phone]

## 2021-04-05 NOTE — Chronic Care Management (AMB) (Signed)
  Chronic Care Management   Note  04/05/2021 Name: DAMYAH GUGEL MRN: [id number] DOB: Jun 01, 1941  LIVIYA SANTINI is a 80 y.o. year old female who is a primary care patient of [name], NP. I reached out to [name] by phone today in response to a referral sent by Ms. Francine Graven Selman's PCP [name], NP     Ms. Leu was given information about Chronic Care Management services today including:  CCM service includes personalized support from designated clinical staff supervised by her physician, including individualized plan of care and coordination with other care providers 24/7 contact phone numbers for assistance for urgent and routine care needs. Service will only be billed when office clinical staff spend 20 minutes or more in a month to coordinate care. Only one practitioner may furnish and bill the service in a calendar month. The patient may stop CCM services at any time (effective at the end of the month) by phone call to the office staff. The patient will be responsible for cost sharing (co-pay) of up to 20% of the service fee (after annual deductible is met).  Patient agreed to services and verbal consent obtained.   Follow up plan: Telephone appointment with care management team member scheduled for:05/09/2021  Julian Hy, [facility]  Direct Dial: [phone]

## 2021-05-07 ENCOUNTER — Encounter: Payer: Self-pay | Admitting: Primary Care

## 2021-05-07 ENCOUNTER — Ambulatory Visit (INDEPENDENT_AMBULATORY_CARE_PROVIDER_SITE_OTHER): Payer: Medicare HMO | Admitting: Primary Care

## 2021-05-07 ENCOUNTER — Other Ambulatory Visit: Payer: Self-pay

## 2021-05-07 VITALS — BP 132/72 | HR 82 | Temp 98.2°F | Ht 65.0 in | Wt 191.0 lb

## 2021-05-07 DIAGNOSIS — G8929 Other chronic pain: Secondary | ICD-10-CM | POA: Diagnosis not present

## 2021-05-07 DIAGNOSIS — R69 Illness, unspecified: Secondary | ICD-10-CM | POA: Diagnosis not present

## 2021-05-07 DIAGNOSIS — K219 Gastro-esophageal reflux disease without esophagitis: Secondary | ICD-10-CM

## 2021-05-07 DIAGNOSIS — I10 Essential (primary) hypertension: Secondary | ICD-10-CM | POA: Diagnosis not present

## 2021-05-07 DIAGNOSIS — Z0000 Encounter for general adult medical examination without abnormal findings: Secondary | ICD-10-CM | POA: Diagnosis not present

## 2021-05-07 DIAGNOSIS — E038 Other specified hypothyroidism: Secondary | ICD-10-CM

## 2021-05-07 DIAGNOSIS — F411 Generalized anxiety disorder: Secondary | ICD-10-CM

## 2021-05-07 DIAGNOSIS — M5440 Lumbago with sciatica, unspecified side: Secondary | ICD-10-CM

## 2021-05-07 DIAGNOSIS — Z23 Encounter for immunization: Secondary | ICD-10-CM

## 2021-05-07 LAB — LIPID PANEL
Cholesterol: 155 mg/dL (ref 0–200)
HDL: 50.9 mg/dL (ref >39.00)
LDL Cholesterol: 83 mg/dL (ref 0–99)
NonHDL: 103.93
Total CHOL/HDL Ratio: 3
Triglycerides: 103 mg/dL (ref 0.0–149.0)
VLDL: 20.6 mg/dL (ref 0.0–40.0)

## 2021-05-07 LAB — COMPREHENSIVE METABOLIC PANEL
ALT: 21 U/L (ref 0–35)
AST: 31 U/L (ref 0–37)
Albumin: 3.3 g/dL — ABNORMAL LOW (ref 3.5–5.2)
Alkaline Phosphatase: 82 U/L (ref 39–117)
BUN: 15 mg/dL (ref 6–23)
CO2: 23 mEq/L (ref 19–32)
Calcium: 9.1 mg/dL (ref 8.4–10.5)
Chloride: 106 mEq/L (ref 96–112)
Creatinine, Ser: 0.97 mg/dL (ref 0.40–1.20)
GFR: 54.89 mL/min — ABNORMAL LOW (ref >60.00)
Glucose, Bld: 93 mg/dL (ref 70–99)
Potassium: 4.4 mEq/L (ref 3.5–5.1)
Sodium: 137 mEq/L (ref 135–145)
Total Bilirubin: 1 mg/dL (ref 0.2–1.2)
Total Protein: 6.8 g/dL (ref 6.0–8.3)

## 2021-05-07 LAB — TSH: TSH: 2.66 u[IU]/mL (ref 0.35–5.50)

## 2021-05-07 NOTE — Assessment & Plan Note (Signed)
Chronic, stable. Overall manageable.   Continue to monitor.

## 2021-05-07 NOTE — Assessment & Plan Note (Signed)
Doing well on the omeprazole 40 mg daily, continue same. She does have breakthrough symptoms at times.

## 2021-05-07 NOTE — Assessment & Plan Note (Signed)
Doing well on Lexapro 10 mg and PRN hydroxyzine 10 mg, continue same.

## 2021-05-07 NOTE — Assessment & Plan Note (Signed)
She is taking levothyroxine 75 mcg correctly. Repeat TSH pending.   Continue levothyroxine 75 mcg for now.

## 2021-05-07 NOTE — Progress Notes (Signed)
Subjective:    Patient ID: [name], female    DOB: 11-03-1940, 80 y.o.   MRN: [id number]  HPI  [name] is a very pleasant 80 y.o. female who presents today for complete physical and follow up of chronic conditions.  Immunizations: -Tetanus: 2012 -Covid-19: Has not completed -Shingles: Shingrix first dose in August 2021, will get the second dose at the pharmacy.  -Pneumonia: Prevnar 13 in July 2021  Diet: [place].  Exercise: Regular walking.  Eye exam: No recent visit Dental exam: No recent visit   Mammogram: Completed in May 2022, scheduled for follow up for December 2022. Dexa: Completed in November 2021 Colonoscopy: Completed in 2011  BP Readings from Last 3 Encounters:  05/07/21 132/72  04/23/20 (!) 134/74  05/02/19 (!) 168/70       Review of Systems  Constitutional:  Negative for unexpected weight change.  HENT:  Negative for rhinorrhea.   Respiratory:  Positive for cough. Negative for shortness of breath.        Intermittent, daily, dry cough. Chronic.   Cardiovascular:  Negative for chest pain.  Gastrointestinal:  Negative for constipation and diarrhea.  Genitourinary:  Negative for difficulty urinating.  Musculoskeletal:  Negative for arthralgias and myalgias.  Skin:  Negative for rash.  Allergic/Immunologic: Negative for environmental allergies.  Neurological:  Negative for dizziness and headaches.  Psychiatric/Behavioral:  The patient is not nervous/anxious.         Past Medical History:  Diagnosis Date   Adenocarcinoma, breast ([place])    Anxiety    Colonic polyp    5 mm adenoma 1977   Degenerative joint disease    Fibromyalgia    GERD (gastroesophageal reflux disease)    Hemorrhoids    History of anemia    History of shingles    Hyperlipidemia    Hypertension    Hypothyroidism    IBS (irritable bowel syndrome)    NASH (nonalcoholic steatohepatitis)    Osteopenia    Personal history of chemotherapy 2005   Left Breast  Cancer   Personal history of radiation therapy 2005   Left Breast Cancer    Social History   Socioeconomic History   Marital status: Married    Spouse name: [name]   Number of children: 2   Years of education: Not on file   Highest education level: Not on file  Occupational History   Occupation: retired  Tobacco Use   Smoking status: Former    Packs/day: 0.20    Years: 7.00    Pack years: 1.40    Types: Cigarettes    Quit date: 09/29/1970    Years since quitting: 50.6   Smokeless tobacco: Never   Tobacco comments:    1 pack per week  Vaping Use   Vaping Use: Never used  Substance and Sexual Activity   Alcohol use: Not Currently   Drug use: No   Sexual activity: Yes  Other Topics Concern   Not on file  Social History Narrative   Would desire CPR   Social Determinants of Health   Financial Resource Strain: Not on file  Food Insecurity: Not on file  Transportation Needs: Not on file  Physical Activity: Not on file  Stress: Not on file  Social Connections: Not on file  Intimate Partner Violence: Not on file    Past Surgical History:  Procedure Laterality Date   [place]  BREAST LUMPECTOMY Left 2005   left--node dissection 7/05 by Dr. Charlynne Pander   LAPAROSCOPIC CHOLECYSTECTOMY     ROTATOR CUFF REPAIR     left    Family History  Problem Relation Age of Onset   Prostate cancer Father    Prostate cancer Brother    Stroke Mother    Aneurysm Brother    Emphysema Sister    Crohn's disease Sister    Liver disease Brother    Hypertension Brother     Allergies  Allergen Reactions   Ciprofloxacin Diarrhea   Codeine     REACTION: nausea and vomiting    Current Outpatient Medications on File Prior to Visit  Medication Sig Dispense Refill   aspirin 81 MG EC tablet Take 81 mg by mouth daily.       escitalopram (LEXAPRO) 10 MG tablet Take 1 tablet (10 mg total) by mouth daily. For anxiety.  90 tablet 0   hydrOXYzine (ATARAX/VISTARIL) 10 MG tablet TAKE 1 TABLET BY MOUTH EVERY DAY AS NEEDED FOR ANXIETY 90 tablet 0   levothyroxine (SYNTHROID) 75 MCG tablet TAKE 1 TABLET BY MOUTH EVERY DAY 90 tablet 1   lisinopril (ZESTRIL) 20 MG tablet TAKE 1 TABLET (20 MG TOTAL) BY MOUTH DAILY. FOR BLOOD PRESSURE. 90 tablet 0   omeprazole (PRILOSEC) 40 MG capsule TAKE 1 CAPSULE BY MOUTH EVERY DAY FOR HEARTBURN. 90 capsule 0   No current facility-administered medications on file prior to visit.    BP 132/72   Pulse 82   Temp 98.2 F (36.8 C) (Temporal)   Ht '5\' 5"'$  (1.651 m)   Wt 191 lb (86.6 kg)   SpO2 97%   BMI 31.78 kg/m  Objective:   Physical Exam HENT:     Right Ear: Tympanic membrane and ear canal normal.     Left Ear: Tympanic membrane and ear canal normal.     Nose: Nose normal.  Eyes:     Conjunctiva/sclera: Conjunctivae normal.     Pupils: Pupils are equal, round, and reactive to light.  Neck:     Thyroid: No thyromegaly.  Cardiovascular:     Rate and Rhythm: Normal rate and regular rhythm.     Heart sounds: No murmur heard. Pulmonary:     Effort: Pulmonary effort is normal.     Breath sounds: Normal breath sounds. No rales.  Abdominal:     General: Bowel sounds are normal.     Palpations: Abdomen is soft.     Tenderness: There is no abdominal tenderness.  Musculoskeletal:        General: Normal range of motion.     Cervical back: Neck supple.  Lymphadenopathy:     Cervical: No cervical adenopathy.  Skin:    General: Skin is warm and dry.     Findings: No rash.  Neurological:     Mental Status: She is alert and oriented to person, place, and time.     Cranial Nerves: No cranial nerve deficit.     Deep Tendon Reflexes: Reflexes are normal and symmetric.  Psychiatric:        Mood and Affect: Mood normal.          Assessment & Plan:      This visit occurred during the SARS-CoV-2 public health emergency.  Safety protocols were in place, including  screening questions prior to the visit, additional usage of staff PPE, and extensive cleaning of exam room while observing appropriate contact time as indicated for disinfecting solutions.

## 2021-05-07 NOTE — Assessment & Plan Note (Signed)
Due for Pneumovax, provided today. Also due for second Shingrix vaccine, she will get at her pharmacy.   Mammogram UTD and due again this December 2022, she is scheduled. Bone density scan UTD.  Discussed the importance of a healthy diet and regular exercise in order for weight loss, and to reduce the risk of further co-morbidity.  Exam today stable. Labs pending.

## 2021-05-07 NOTE — Patient Instructions (Addendum)
Stop by the lab prior to leaving today. I will notify you of your results once received.   Call your pharmacy to find out what medication is costing so much. Also ask them why it is costing so much.  Go by your pharmacy to get your second shingles vaccination and your TDAP. Wait at least two weeks from today.  It was a pleasure to see you today!  Preventive Care 81 Years and Older, Female Preventive care refers to lifestyle choices and visits with your health care provider that can promote health and wellness. This includes: A yearly physical exam. This is also called an annual wellness visit. Regular dental and eye exams. Immunizations. Screening for certain conditions. Healthy lifestyle choices, such as: Eating a healthy diet. Getting regular exercise. Not using drugs or products that contain nicotine and tobacco. Limiting alcohol use. What can I expect for my preventive care visit? Physical exam Your health care provider will check your: Height and weight. These may be used to calculate your BMI (body mass index). BMI is a measurement that tells if you are at a healthy weight. Heart rate and blood pressure. Body temperature. Skin for abnormal spots. Counseling Your health care provider may ask you questions about your: Past medical problems. Family's medical history. Alcohol, tobacco, and drug use. Emotional well-being. Home life and relationship well-being. Sexual activity. Diet, exercise, and sleep habits. History of falls. Memory and ability to understand (cognition). Work and work Statistician. Pregnancy and menstrual history. Access to firearms. What immunizations do I need?  Vaccines are usually given at various ages, according to a schedule. Your health care provider will recommend vaccines for you based on your age, medicalhistory, and lifestyle or other factors, such as travel or where you work. What tests do I need? Blood tests Lipid and cholesterol levels.  These may be checked every 5 years, or more often depending on your overall health. Hepatitis C test. Hepatitis B test. Screening Lung cancer screening. You may have this screening every year starting at age 61 if you have a 30-pack-year history of smoking and currently smoke or have quit within the past 15 years. Colorectal cancer screening. All adults should have this screening starting at age 71 and continuing until age 31. Your health care provider may recommend screening at age 1 if you are at increased risk. You will have tests every 1-10 years, depending on your results and the type of screening test. Diabetes screening. This is done by checking your blood sugar (glucose) after you have not eaten for a while (fasting). You may have this done every 1-3 years. Mammogram. This may be done every 1-2 years. Talk with your health care provider about how often you should have regular mammograms. Abdominal aortic aneurysm (AAA) screening. You may need this if you are a current or former smoker. BRCA-related cancer screening. This may be done if you have a family history of breast, ovarian, tubal, or peritoneal cancers. Other tests STD (sexually transmitted disease) testing, if you are at risk. Bone density scan. This is done to screen for osteoporosis. You may have this done starting at age 68. Talk with your health care provider about your test results, treatment options,and if necessary, the need for more tests. Follow these instructions at home: Eating and drinking  Eat a diet that includes fresh fruits and vegetables, whole grains, lean protein, and low-fat dairy products. Limit your intake of foods with high amounts of sugar, saturated fats, and salt. Take vitamin and mineral  supplements as recommended by your health care provider. Do not drink alcohol if your health care provider tells you not to drink. If you drink alcohol: Limit how much you have to 0-1 drink a day. Be aware of  how much alcohol is in your drink. In the [place] one drink equals one 12 oz bottle of beer (355 mL), one 5 oz glass of wine (148 mL), or one 1 oz glass of hard liquor (44 mL).  Lifestyle Take daily care of your teeth and gums. Brush your teeth every morning and night with fluoride toothpaste. Floss one time each day. Stay active. Exercise for at least 30 minutes 5 or more days each week. Do not use any products that contain nicotine or tobacco, such as cigarettes, e-cigarettes, and chewing tobacco. If you need help quitting, ask your health care provider. Do not use drugs. If you are sexually active, practice safe sex. Use a condom or other form of protection in order to prevent STIs (sexually transmitted infections). Talk with your health care provider about taking a low-dose aspirin or statin. Find healthy ways to cope with stress, such as: Meditation, yoga, or listening to music. Journaling. Talking to a trusted person. Spending time with friends and family. Safety Always wear your seat belt while driving or riding in a vehicle. Do not drive: If you have been drinking alcohol. Do not ride with someone who has been drinking. When you are tired or distracted. While texting. Wear a helmet and other protective equipment during sports activities. If you have firearms in your house, make sure you follow all gun safety procedures. What's next? Visit your health care provider once a year for an annual wellness visit. Ask your health care provider how often you should have your eyes and teeth checked. Stay up to date on all vaccines. This information is not intended to replace advice given to you by your health care provider. Make sure you discuss any questions you have with your healthcare provider. Document Revised: 09/05/2020 Document Reviewed: 09/09/2018 Elsevier Patient Education  2022 [employer].

## 2021-05-07 NOTE — Addendum Note (Signed)
Addended by: [name] on: 05/07/2021 11:33 AM   Modules accepted: Orders

## 2021-05-07 NOTE — Assessment & Plan Note (Addendum)
Controlled in the office today, continue lisinopril 20 mg daily. CMP pending.   She does have what sounds like an ACE induced cough. Offered to switch her to olmesartan, she kindly declines. She will notify if her cough becomes bothersome.

## 2021-05-07 NOTE — Addendum Note (Signed)
Addended by: Francella Solian on: 05/07/2021 11:54 AM   Modules accepted: Orders

## 2021-05-07 NOTE — Addendum Note (Signed)
Addended by: Francella Solian on: 05/07/2021 03:47 PM   Modules accepted: Orders

## 2021-05-09 ENCOUNTER — Ambulatory Visit (INDEPENDENT_AMBULATORY_CARE_PROVIDER_SITE_OTHER): Payer: Medicare HMO

## 2021-05-09 DIAGNOSIS — E785 Hyperlipidemia, unspecified: Secondary | ICD-10-CM | POA: Diagnosis not present

## 2021-05-09 DIAGNOSIS — I10 Essential (primary) hypertension: Secondary | ICD-10-CM

## 2021-05-09 NOTE — Chronic Care Management (AMB) (Signed)
Chronic Care Management   CCM RN Visit Note  05/09/2021 Name: [name] MRN: [id number] DOB: May 08, 1941  Subjective: [name] is a 80 y.o. year old female who is a primary care patient of [name], NP. The care management team was consulted for assistance with disease management and care coordination needs.    Engaged with patient by telephone for initial visit in response to provider referral for case management and/or care coordination services.   Consent to Services:  The patient was given the following information about Chronic Care Management services today, agreed to services, and gave verbal consent: 1. CCM service includes personalized support from designated clinical staff supervised by the primary care provider, including individualized plan of care and coordination with other care providers 2. 24/7 contact phone numbers for assistance for urgent and routine care needs. 3. Service will only be billed when office clinical staff spend 20 minutes or more in a month to coordinate care. 4. Only one practitioner may furnish and bill the service in a calendar month. 5.The patient may stop CCM services at any time (effective at the end of the month) by phone call to the office staff. 6. The patient will be responsible for cost sharing (co-pay) of up to 20% of the service fee (after annual deductible is met). Patient agreed to services and consent obtained.  Patient agreed to services and verbal consent obtained.   Assessment: Review of patient past medical history, allergies, medications, health status, including review of consultants reports, laboratory and other test data, was performed as part of comprehensive evaluation and provision of chronic care management services.   SDOH (Social Determinants of Health) assessments and interventions performed:  SDOH Interventions    Flowsheet Row Most Recent Value  SDOH Interventions   Food Insecurity Interventions  Intervention Not Indicated  Housing Interventions Intervention Not Indicated  Transportation Interventions Intervention Not Indicated        CCM Care Plan  Allergies  Allergen Reactions   Ciprofloxacin Diarrhea   Codeine     REACTION: nausea and vomiting    Outpatient Encounter Medications as of 05/09/2021  Medication Sig   aspirin 81 MG EC tablet Take 81 mg by mouth daily.     escitalopram (LEXAPRO) 10 MG tablet Take 1 tablet (10 mg total) by mouth daily. For anxiety.   hydrOXYzine (ATARAX/VISTARIL) 10 MG tablet TAKE 1 TABLET BY MOUTH EVERY DAY AS NEEDED FOR ANXIETY   levothyroxine (SYNTHROID) 75 MCG tablet TAKE 1 TABLET BY MOUTH EVERY DAY   lisinopril (ZESTRIL) 20 MG tablet TAKE 1 TABLET (20 MG TOTAL) BY MOUTH DAILY. FOR BLOOD PRESSURE.   omeprazole (PRILOSEC) 40 MG capsule TAKE 1 CAPSULE BY MOUTH EVERY DAY FOR HEARTBURN.   No facility-administered encounter medications on file as of 05/09/2021.    Patient Active Problem List   Diagnosis Date Noted   Allergic rhinitis 05/02/2019   Acute non-recurrent sinusitis 05/17/2018   Neurodermatitis 05/17/2018   Medicare annual wellness visit, subsequent 02/17/2018   Preventative health care 02/13/2016   Dysuria 08/16/2015   Chronic back pain 07/03/2015   Hepatic steatosis 11/27/2009   COLONIC POLYPS, ADENOMATOUS, HX OF 11/27/2009   Malignant neoplasm of female breast ([place]) 10/21/2007   Hypothyroidism 10/21/2007   Disorder of bone and cartilage 10/21/2007   HLD (hyperlipidemia) 09/17/2007   GAD (generalized anxiety disorder) 09/17/2007   Essential hypertension 09/17/2007   GERD 09/17/2007   IRRITABLE BOWEL SYNDROME 09/17/2007   FIBROMYALGIA 09/17/2007    Conditions to  be addressed/monitored:HTN and HLD  Care Plan : Cardiovascular  Updates made by Dannielle Karvonen, RN since 05/09/2021 12:00 AM     Problem: Hypertension / Hyperlipidemia   Priority: Medium     Long-Range Goal: Hypertension / Hyperlipidemia Monitored    Start Date: 05/09/2021  Expected End Date: 08/28/2021  This Visit's Progress: On track  Priority: Medium  Note:   Objective:  Last practice recorded BP readings:  BP Readings from Last 3 Encounters:  05/07/21 132/72  04/23/20 (!) 134/74  05/02/19 (!) 168/70   Component Value Date/Time   CHOL 155 05/07/2021 1141   TRIG 103.0 05/07/2021 1141   HDL 50.90 05/07/2021 1141   CHOLHDL 3 05/07/2021 1141   VLDL 20.6 05/07/2021 1141   LDLCALC 83 05/07/2021 1141   LDLDIRECT 166.9 01/20/2013 1033  Current Barriers:  Knowledge Deficits related to long term care plan for self management of hypertension and hyperlipidemia:  Patient states she monitors her blood pressure 1 x per week. She reports she is able to afford her medications and takes them as prescribed. Patient states she has transportation her appointments.  Case Manager Clinical Goal(s):  patient will attend scheduled medical appointments patient will demonstrate improved adherence to prescribed treatment plan for hypertension as evidenced by taking all medications as prescribed, monitoring and recording blood pressure as directed, adhering to low sodium/DASH diet Check blood pressure regularly Take her medications as prescribed.  Interventions:  Collaboration with [name], NP regarding development and update of comprehensive plan of care as evidenced by provider attestation and co-signature Inter-disciplinary care team collaboration (see longitudinal plan of care) Evaluation of current treatment plan related to hypertension self management and patient's adherence to plan as established by provider. Provided education to patient re: stroke prevention, s/s of heart attack and stroke, DASH diet, complications of uncontrolled blood pressure Reviewed medications with patient and discussed importance of compliance Discussed plans with patient for ongoing care management follow up and provided patient with direct contact information  for care management team Advised patient, providing education and rationale, to monitor blood pressure daily and record, calling PCP for findings outside established parameters.  Reviewed scheduled/upcoming provider appointments Self-Care Activities: - Self administers medications as prescribed Attends all scheduled provider appointments Calls provider office for new concerns, questions, or BP outside discussed parameters Checks BP and records as discussed Follows a low sodium diet/DASH diet Patient Goals: - check blood pressure at least 1 time per week and record in log/ diary.  - Take your medications as prescribed and refill timely - Follow up with your provider as recommended.  - Have your lab work done as recommended by your provider.  - Follow a heart healthy / low salt diet:  Eat low salt and heart healthy meals full of fruits, vegetables, Whole grains, lean protein and limit fat and sugars.  Follow Up Plan: The patient has been provided with contact information for the care management team and has been advised to call with any health related questions or concerns.  The care management team will reach out to the patient again over the next 45 days.                Plan:The patient has been provided with contact information for the care management team and has been advised to call with any health related questions or concerns.  and The care management team will reach out to the patient again over the next 45 days. Quinn Plowman RN,BSN,CCM RN Case Manager Velora Heckler  [place]  [phone]

## 2021-05-09 NOTE — Patient Instructions (Signed)
Visit Information:  Thank you for taking the time to speak with me today.    PATIENT GOALS:   Goals Addressed             This Visit's Progress    COMPLETED: Patient Stated       Track and Manage My Blood Pressure-Hypertension / Hyperlipidemia ( High cholesterol)   On track    Timeframe:  Long-Range Goal Priority:  Medium Start Date:    05/09/2021                         Expected End Date: 08/28/2021                      Follow Up Date 06/25/2021  - check blood pressure at least 1 time per week and record in log/ diary.  - Take your medications as prescribed and refill timely - Follow up with your provider as recommended.  - Have your lab work done as recommended by your provider.  - Follow a heart healthy / low salt diet:  Eat low salt and heart healthy meals full of fruits, vegetables, Whole grains, lean protein and limit fat and sugars.      Why is this important?   You won't feel high blood pressure, but it can still hurt your blood vessels.  High blood pressure can cause heart or kidney problems. It can also cause a stroke.  Making lifestyle changes like losing a little weight or eating less salt will help.  Checking your blood pressure at home and at different times of the day can help to control blood pressure.  If the doctor prescribes medicine remember to take it the way the doctor ordered.  Call the office if you cannot afford the medicine or if there are questions about it.             Consent to CCM Services: [name] was given information about Chronic Care Management services including:  CCM service includes personalized support from designated clinical staff supervised by her physician, including individualized plan of care and coordination with other care providers 24/7 contact phone numbers for assistance for urgent and routine care needs. Service will only be billed when office clinical staff spend 20 minutes or more in a month to coordinate care. Only  one practitioner may furnish and bill the service in a calendar month. The patient may stop CCM services at any time (effective at the end of the month) by phone call to the office staff. The patient will be responsible for cost sharing (co-pay) of up to 20% of the service fee (after annual deductible is met).  Patient agreed to services and verbal consent obtained.   The patient verbalized understanding of instructions, educational materials, and care plan provided today and agreed to receive a mailed copy of patient instructions, educational materials, and care plan.   The patient has been provided with contact information for the care management team and has been advised to call with any health related questions or concerns.  The care management team will reach out to the patient again over the next 45 days.   Kaitlyn Plowman RN,BSN,CCM RN Case Manager [place]  [phone]   CLINICAL CARE PLAN: Patient Care Plan: Cardiovascular     Problem Identified: Hypertension / Hyperlipidemia   Priority: Medium     Long-Range Goal: Hypertension / Hyperlipidemia Monitored   Start Date: 05/09/2021  Expected End Date:  08/28/2021  This Visit's Progress: On track  Priority: Medium  Note:   Objective:  Last practice recorded BP readings:  BP Readings from Last 3 Encounters:  05/07/21 132/72  04/23/20 (!) 134/74  05/02/19 (!) 168/70   Component Value Date/Time   CHOL 155 05/07/2021 1141   TRIG 103.0 05/07/2021 1141   HDL 50.90 05/07/2021 1141   CHOLHDL 3 05/07/2021 1141   VLDL 20.6 05/07/2021 1141   LDLCALC 83 05/07/2021 1141   LDLDIRECT 166.9 01/20/2013 1033  Current Barriers:  Knowledge Deficits related to long term care plan for self management of hypertension and hyperlipidemia:  Patient states she monitors her blood pressure 1 x per week. She reports she is able to afford her medications and takes them as prescribed. Patient states she has transportation her  appointments.  Case Manager Clinical Goal(s):  patient will attend scheduled medical appointments patient will demonstrate improved adherence to prescribed treatment plan for hypertension as evidenced by taking all medications as prescribed, monitoring and recording blood pressure as directed, adhering to low sodium/DASH diet Check blood pressure regularly Take her medications as prescribed.  Interventions:  Collaboration with [name], NP regarding development and update of comprehensive plan of care as evidenced by provider attestation and co-signature Inter-disciplinary care team collaboration (see longitudinal plan of care) Evaluation of current treatment plan related to hypertension self management and patient's adherence to plan as established by provider. Provided education to patient re: stroke prevention, s/s of heart attack and stroke, DASH diet, complications of uncontrolled blood pressure Reviewed medications with patient and discussed importance of compliance Discussed plans with patient for ongoing care management follow up and provided patient with direct contact information for care management team Advised patient, providing education and rationale, to monitor blood pressure daily and record, calling PCP for findings outside established parameters.  Reviewed scheduled/upcoming provider appointments Self-Care Activities: - Self administers medications as prescribed Attends all scheduled provider appointments Calls provider office for new concerns, questions, or BP outside discussed parameters Checks BP and records as discussed Follows a low sodium diet/DASH diet Patient Goals: - check blood pressure at least 1 time per week and record in log/ diary.  - Take your medications as prescribed and refill timely - Follow up with your provider as recommended.  - Have your lab work done as recommended by your provider.  - Follow a heart healthy / low salt diet:  Eat low salt  and heart healthy meals full of fruits, vegetables, Whole grains, lean protein and limit fat and sugars.  Follow Up Plan: The patient has been provided with contact information for the care management team and has been advised to call with any health related questions or concerns.  The care management team will reach out to the patient again over the next 45 days.

## 2021-05-10 ENCOUNTER — Other Ambulatory Visit: Payer: Self-pay | Admitting: Primary Care

## 2021-05-10 DIAGNOSIS — I10 Essential (primary) hypertension: Secondary | ICD-10-CM

## 2021-05-10 NOTE — Telephone Encounter (Signed)
Recent CPE will call in 90 day with 1 refill.

## 2021-05-24 ENCOUNTER — Telehealth: Payer: Self-pay

## 2021-05-24 ENCOUNTER — Other Ambulatory Visit: Payer: Self-pay

## 2021-05-24 ENCOUNTER — Emergency Department
Admission: EM | Admit: 2021-05-24 | Discharge: 2021-05-24 | Disposition: A | Discharge status: Home / Self Care | Payer: Medicare HMO | Attending: Emergency Medicine | Admitting: Emergency Medicine

## 2021-05-24 ENCOUNTER — Emergency Department: Payer: Medicare HMO

## 2021-05-24 DIAGNOSIS — Z87891 Personal history of nicotine dependence: Secondary | ICD-10-CM | POA: Diagnosis not present

## 2021-05-24 DIAGNOSIS — Z9221 Personal history of antineoplastic chemotherapy: Secondary | ICD-10-CM | POA: Insufficient documentation

## 2021-05-24 DIAGNOSIS — I10 Essential (primary) hypertension: Secondary | ICD-10-CM | POA: Diagnosis not present

## 2021-05-24 DIAGNOSIS — J0140 Acute pansinusitis, unspecified: Secondary | ICD-10-CM | POA: Diagnosis not present

## 2021-05-24 DIAGNOSIS — Z853 Personal history of malignant neoplasm of breast: Secondary | ICD-10-CM | POA: Diagnosis not present

## 2021-05-24 DIAGNOSIS — Z923 Personal history of irradiation: Secondary | ICD-10-CM | POA: Diagnosis not present

## 2021-05-24 DIAGNOSIS — W01198A Fall on same level from slipping, tripping and stumbling with subsequent striking against other object, initial encounter: Secondary | ICD-10-CM | POA: Diagnosis not present

## 2021-05-24 DIAGNOSIS — Z7982 Long term (current) use of aspirin: Secondary | ICD-10-CM | POA: Diagnosis not present

## 2021-05-24 DIAGNOSIS — Z79899 Other long term (current) drug therapy: Secondary | ICD-10-CM | POA: Insufficient documentation

## 2021-05-24 DIAGNOSIS — D62 Acute posthemorrhagic anemia: Secondary | ICD-10-CM | POA: Diagnosis not present

## 2021-05-24 DIAGNOSIS — D500 Iron deficiency anemia secondary to blood loss (chronic): Secondary | ICD-10-CM

## 2021-05-24 DIAGNOSIS — E039 Hypothyroidism, unspecified: Secondary | ICD-10-CM | POA: Diagnosis not present

## 2021-05-24 DIAGNOSIS — S0003XA Contusion of scalp, initial encounter: Secondary | ICD-10-CM | POA: Insufficient documentation

## 2021-05-24 DIAGNOSIS — R42 Dizziness and giddiness: Secondary | ICD-10-CM | POA: Diagnosis present

## 2021-05-24 LAB — CBC WITH DIFFERENTIAL/PLATELET
Abs Immature Granulocytes: 0.03 10*3/uL (ref 0.00–0.07)
Basophils Absolute: 0 10*3/uL (ref 0.0–0.1)
Basophils Relative: 1 %
Eosinophils Absolute: 0 10*3/uL (ref 0.0–0.5)
Eosinophils Relative: 1 %
HCT: 27.7 % — ABNORMAL LOW (ref 36.0–46.0)
Hemoglobin: 8.9 g/dL — ABNORMAL LOW (ref 12.0–15.0)
Immature Granulocytes: 1 %
Lymphocytes Relative: 13 %
Lymphs Abs: 0.8 10*3/uL (ref 0.7–4.0)
MCH: 25 pg — ABNORMAL LOW (ref 26.0–34.0)
MCHC: 32.1 g/dL (ref 30.0–36.0)
MCV: 77.8 fL — ABNORMAL LOW (ref 80.0–100.0)
Monocytes Absolute: 0.6 10*3/uL (ref 0.1–1.0)
Monocytes Relative: 9 %
Neutro Abs: 5 10*3/uL (ref 1.7–7.7)
Neutrophils Relative %: 75 %
Platelets: 215 10*3/uL (ref 150–400)
RBC: 3.56 MIL/uL — ABNORMAL LOW (ref 3.87–5.11)
RDW: 16.7 % — ABNORMAL HIGH (ref 11.5–15.5)
WBC: 6.5 10*3/uL (ref 4.0–10.5)
nRBC: 0 % (ref 0.0–0.2)

## 2021-05-24 LAB — COMPREHENSIVE METABOLIC PANEL
ALT: 23 U/L (ref 0–44)
AST: 38 U/L (ref 15–41)
Albumin: 2.9 g/dL — ABNORMAL LOW (ref 3.5–5.0)
Alkaline Phosphatase: 87 U/L (ref 38–126)
Anion gap: 5 (ref 5–15)
BUN: 16 mg/dL (ref 8–23)
CO2: 22 mmol/L (ref 22–32)
Calcium: 8.9 mg/dL (ref 8.9–10.3)
Chloride: 107 mmol/L (ref 98–111)
Creatinine, Ser: 0.91 mg/dL (ref 0.44–1.00)
GFR, Estimated: >60 mL/min (ref >60)
Glucose, Bld: 123 mg/dL — ABNORMAL HIGH (ref 70–99)
Potassium: 4.1 mmol/L (ref 3.5–5.1)
Sodium: 134 mmol/L — ABNORMAL LOW (ref 135–145)
Total Bilirubin: 0.8 mg/dL (ref 0.3–1.2)
Total Protein: 6.6 g/dL (ref 6.5–8.1)

## 2021-05-24 LAB — URINALYSIS, COMPLETE (UACMP) WITH MICROSCOPIC
Bacteria, UA: NONE SEEN
Bilirubin Urine: NEGATIVE
Glucose, UA: NEGATIVE mg/dL
Hgb urine dipstick: NEGATIVE
Ketones, ur: 5 mg/dL — AB
Nitrite: NEGATIVE
Protein, ur: NEGATIVE mg/dL
Specific Gravity, Urine: 1.024 (ref 1.005–1.030)
pH: 5 (ref 5.0–8.0)

## 2021-05-24 MED ORDER — AMOXICILLIN-POT CLAVULANATE 875-125 MG PO TABS: 1.0000 | ORAL_TABLET | Freq: Two times a day (BID) | ORAL | 0 refills | Status: AC

## 2021-05-24 NOTE — Telephone Encounter (Signed)
Noted and agree, especially given acute cogitative decline.

## 2021-05-24 NOTE — Discharge Instructions (Addendum)
Please see primary care as scheduled to further evaluate for anemia.  Take the augmentin for sinusitis.   Return to the ER for symptoms that change or worsen if unable to see primary care.

## 2021-05-24 NOTE — ED Notes (Signed)
Patient transported to CT 

## 2021-05-24 NOTE — ED Notes (Signed)
Pt aware we need urine sample, states she does not need to use restroom at this time. Will notify nursing staff when she needs to void.

## 2021-05-24 NOTE — ED Notes (Signed)
Pt returned from CT °

## 2021-05-24 NOTE — Telephone Encounter (Signed)
Spoke with Dawn and she will talk with her sister and pt to see if pt will go to ED; explained symptoms could be brought on by different things but one concern would be pending stroke. Dawn will cb later today to let us know pts status and if pt will go to ED.

## 2021-05-24 NOTE — Telephone Encounter (Signed)
Mrs. [name] called and stated that they talked her into going to the hospital [facility]

## 2021-05-24 NOTE — ED Triage Notes (Signed)
Pt states she lost her balance and fell back on the the hard wood floor today, denies LOC. States she is not on any blood thinners, pt is ambulatory with her walker on arrival, pt is a/ox4

## 2021-05-24 NOTE — Telephone Encounter (Signed)
Dawn, patient's daughter, called stating that patient fell yesterday and hit her head. Dawn said patient does not know what happened to cause this or if she passed out. EMS came out and checked patient out. Dawn is concerned because she has noted some decline in patient status, they are getting her a walker. Patient also has had a cough and head congestion for 3 weeks now. Her husband was positive for Covid 3 weeks ago but patient has been re testing and keeps been negative.  Patient has an appointment with Dr Einar Pheasant on 05/27/21 since PCP does not have an appointment until 05/29/21 for now.  I have sent Dawn to access nurse to speak with them to see if patient should wait till Monday.  FYI to PCP and Dr Einar Pheasant

## 2021-05-24 NOTE — Telephone Encounter (Signed)
[facility] - Client TELEPHONE ADVICE RECORD [facility] Patient Name: [name] Gender: Female DOB: [birth year] Age: 80 Y 3 M 23 D Return Phone Number: [phone] (Primary) Address: City/ State/ Zip: [place] [address] Client [facility] - Client Client Site [place] - Day Physician Alma Friendly - NP Contact Type Call Who Is Calling Patient / Member / Family / Caregiver Call Type Triage / Clinical Caller Name Dawn Relationship To Patient Daughter Return Phone Number [phone] (Primary) Chief Complaint HEAD INJURY - and not acting right. Change in behaviour after hitting head. Reason for Call Symptomatic / Request for Health Information Initial Comment Caller states, pt has an appt Monday. Pt had prev. fall, hit head. Pt does not remember the incident and cog. decline. Pt having mini stroke and cold symptoms. Additional Comment Not with pt. Translation No Nurse Assessment Nurse: Radford Pax, RN, Eugene Garnet Date/Time (Eastern Time): 05/24/2021 10:51:46 AM Confirm and document reason for call. If symptomatic, describe symptoms. ---Caller states, pt has an appt Monday. Had a fall yesterday and hit head. Seems to be having a steady declined in cognitive abilities. Also has a cough. Does the patient have any new or worsening symptoms? ---Yes Will a triage be completed? ---Yes Related visit to physician within the last 2 weeks? ---No Does the PT have any chronic conditions? (i.e. diabetes, asthma, this includes High risk factors for pregnancy, etc.) ---No Is this a behavioral health or substance abuse call? ---No Guidelines Guideline Title Affirmed Question Affirmed Notes Nurse Date/Time (Eastern Time) Falls and Falling [1] MODERATE weakness (i.e., interferes with work, school, normal activities) AND [2] new-onset or worsening Radford Pax, Therapist, sports, Eugene Garnet 05/24/2021  10:53:47 AM PLEASE NOTE: All timestamps contained within this report are represented as [place] Standard Time. CONFIDENTIALTY NOTICE: This fax transmission is intended only for the addressee. It contains information that is legally privileged, confidential or otherwise protected from use or disclosure. If you are not the intended recipient, you are strictly prohibited from reviewing, disclosing, copying using or disseminating any of this information or taking any action in reliance on or regarding this information. If you have received this fax in error, please notify us immediately by telephone so that we can arrange for its return to [place]. Phone: [phone], Toll-Free: [phone], Fax: [phone] Page: 2 of 2 Call Id: [id number] [place]. Time Eilene Ghazi Time) Disposition Final User 05/24/2021 10:48:56 AM Send to Urgent Queue Lonia Farber 05/24/2021 10:59:21 AM See HCP within 4 Hours (or PCP triage) Yes Radford Pax, RN, Eugene Garnet Caller Disagree/Comply Comply Caller Understands Yes PreDisposition Call Doctor Care Advice Given Per Guideline SEE HCP (OR PCP TRIAGE) WITHIN 4 HOURS: * IF OFFICE WILL BE OPEN: You need to be seen within the next 3 or 4 hours. Call your doctor (or NP/PA) now or as soon as the office opens. CALL BACK IF: * You become worse CARE ADVICE given per Fall and Falling (Adult) guideline. Comments User: Susie Cassette, RN Date/Time Eilene Ghazi Time): 05/24/2021 11:02:22 AM Office is completely booked for today. Advised caller to see care at [facility] or ED. She states that pt won't go to UC but will keep appt for monday. Referrals REFERRED TO PCP OFFICE

## 2021-05-24 NOTE — ED Provider Notes (Signed)
[name] - [facility] Emergency Department Provider Note ____________________________________________   Event Date/Time   First MD Initiated Contact with Patient 05/24/21 1606     (approximate)  I have reviewed the triage vital signs and the nursing notes.   HISTORY  Chief Complaint Fall  HPI [name] is a 80 y.o. female with history of as listed below presents to the emergency department for treatment and evaluation after feeling dizzy and falling backward striking her head on the hardwood floor at home. She states that she was getting up to go to the bathroom and for an unknown reason, fell straight back. She reports having sinus issues for more than a month. No antibiotic treatment. Otherwise, she is in her normal state of health. She denies headache, nausea, vomiting, fever, or other concerns.      Past Medical History:  Diagnosis Date   Adenocarcinoma, breast ([place])    Anxiety    Colonic polyp    5 mm adenoma 1977   Degenerative joint disease    Fibromyalgia    GERD (gastroesophageal reflux disease)    Hemorrhoids    History of anemia    History of shingles    Hyperlipidemia    Hypertension    Hypothyroidism    IBS (irritable bowel syndrome)    NASH (nonalcoholic steatohepatitis)    Osteopenia    Personal history of chemotherapy 2005   Left Breast Cancer   Personal history of radiation therapy 2005   Left Breast Cancer   UTI (urinary tract infection) 03/01/2015    Patient Active Problem List   Diagnosis Date Noted   Allergic rhinitis 05/02/2019   Acute non-recurrent sinusitis 05/17/2018   Neurodermatitis 05/17/2018   Medicare annual wellness visit, subsequent 02/17/2018   Preventative health care 02/13/2016   Dysuria 08/16/2015   Chronic back pain 07/03/2015   Hepatic steatosis 11/27/2009   COLONIC POLYPS, ADENOMATOUS, HX OF 11/27/2009   Malignant neoplasm of female breast ([place]) 10/21/2007   Hypothyroidism 10/21/2007   Disorder of  bone and cartilage 10/21/2007   HLD (hyperlipidemia) 09/17/2007   GAD (generalized anxiety disorder) 09/17/2007   Essential hypertension 09/17/2007   GERD 09/17/2007   IRRITABLE BOWEL SYNDROME 09/17/2007   FIBROMYALGIA 09/17/2007    Past Surgical History:  Procedure Laterality Date   ABDOMINAL HYSTERECTOMY     APPENDECTOMY     BILATERAL SALPINGOOPHORECTOMY  1978   BREAST LUMPECTOMY Left 2005   left--node dissection 7/05 by Dr. Charlynne Pander   LAPAROSCOPIC CHOLECYSTECTOMY     ROTATOR CUFF REPAIR     left    Prior to Admission medications   Medication Sig Start Date End Date Taking? Authorizing Provider  amoxicillin-clavulanate (AUGMENTIN) 875-125 MG tablet Take 1 tablet by mouth 2 (two) times daily for 10 days. 05/24/21 06/03/21 Yes Reiner Loewen B, FNP  aspirin 81 MG EC tablet Take 81 mg by mouth daily.      [provider]  escitalopram (LEXAPRO) 10 MG tablet Take 1 tablet (10 mg total) by mouth daily. For anxiety. 02/28/21   [name], NP  hydrOXYzine (ATARAX/VISTARIL) 10 MG tablet TAKE 1 TABLET BY MOUTH EVERY DAY AS NEEDED FOR ANXIETY 05/24/19   [name], NP  levothyroxine (SYNTHROID) 75 MCG tablet TAKE 1 TABLET BY MOUTH EVERY DAY 11/20/20   [name], NP  lisinopril (ZESTRIL) 20 MG tablet TAKE 1 TABLET (20 MG TOTAL) BY MOUTH DAILY. FOR BLOOD PRESSURE. 05/10/21   [name], NP  omeprazole (PRILOSEC) 40  MG capsule TAKE 1 CAPSULE BY MOUTH EVERY DAY FOR HEARTBURN. 02/28/21   [name], NP    Allergies Ciprofloxacin and Codeine  Family History  Problem Relation Age of Onset   Prostate cancer Father    Prostate cancer Brother    Stroke Mother    Aneurysm Brother    Emphysema Sister    Crohn's disease Sister    Liver disease Brother    Hypertension Brother     Social History Social History   Tobacco Use   Smoking status: Former    Packs/day: 0.20    Years: 7.00    Pack years: 1.40    Types: Cigarettes    Quit date: 09/29/1970     Years since quitting: 50.6   Smokeless tobacco: Never   Tobacco comments:    1 pack per week  Vaping Use   Vaping Use: Never used  Substance Use Topics   Alcohol use: Not Currently   Drug use: No    Review of Systems  Constitutional: No fever/chills Eyes: No visual changes. ENT: No sore throat. Cardiovascular: Denies chest pain. Respiratory: Denies shortness of breath. Gastrointestinal: No abdominal pain.  No nausea, no vomiting.  No diarrhea.  Genitourinary: Negative for dysuria. Musculoskeletal: Positive for "tailbone" pain. Skin: Negative for rash. Neurological: Negative for headaches, focal weakness or numbness. ____________________________________________   PHYSICAL EXAM:  VITAL SIGNS: ED Triage Vitals [05/24/21 1345]  Enc Vitals Group     BP 137/68     Pulse Rate 82     Resp 16     Temp 98.2 F (36.8 C)     Temp Source Oral     SpO2 96 %     Weight      Height      Head Circumference      Peak Flow      Pain Score      Pain Loc      Pain Edu?      Excl. in [place]?     Constitutional: Alert and oriented. Well appearing and in no acute distress. Eyes: Conjunctivae are normal. PERRL.  Head: Atraumatic. Nose: No congestion/rhinnorhea. Mouth/Throat: Mucous membranes are moist.  Oropharynx non-erythematous. Neck: No stridor.   Hematological/Lymphatic/Immunilogical: No cervical lymphadenopathy. Cardiovascular: Normal rate, regular rhythm. Grossly normal heart sounds.  Good peripheral circulation. Respiratory: Normal respiratory effort.  No retractions. Lungs CTAB. Gastrointestinal: Soft and nontender. No distention. No abdominal bruits. No CVA tenderness. Genitourinary:  Musculoskeletal: No lower extremity tenderness nor edema.  No joint effusions. Neurologic:  Normal speech and language. No gross focal neurologic deficits are appreciated. No gait instability. Skin:  Skin is warm, dry and intact. No rash noted. Psychiatric: Mood and affect are normal.  Speech and behavior are normal.  ____________________________________________   LABS (all labs ordered are listed, but only abnormal results are displayed)  Labs Reviewed  COMPREHENSIVE METABOLIC PANEL - Abnormal; Notable for the following components:      Result Value   Sodium 134 (*)    Glucose, Bld 123 (*)    Albumin 2.9 (*)    All other components within normal limits  CBC WITH DIFFERENTIAL/PLATELET - Abnormal; Notable for the following components:   RBC 3.56 (*)    Hemoglobin 8.9 (*)    HCT 27.7 (*)    MCV 77.8 (*)    MCH 25.0 (*)    RDW 16.7 (*)    All other components within normal limits  URINALYSIS, COMPLETE (UACMP) WITH MICROSCOPIC - Abnormal; Notable  for the following components:   Color, Urine AMBER (*)    APPearance HAZY (*)    Ketones, ur 5 (*)    Leukocytes,Ua MODERATE (*)    All other components within normal limits   ____________________________________________  EKG  ED ECG REPORT I, Sophina Mitten, FNP-BC personally viewed and interpreted this ECG.   Date: 05/24/2021  EKG Time: 1652  Rate: 69  Rhythm: normal EKG, normal sinus rhythm  Axis: normal  Intervals:none  ST&T Change: none  ____________________________________________  RADIOLOGY  ED MD interpretation:    CT head without acute concerns for ICH, however there is evidence of bilateral sinusitis.  I, Sherrie George, personally viewed and evaluated these images (plain radiographs) as part of my medical decision making, as well as reviewing the written report by the radiologist.  Official radiology report(s): CT HEAD WO CONTRAST (5MM)  Result Date: 05/24/2021 CLINICAL DATA:  Head trauma, mod-severe EXAM: CT HEAD WITHOUT CONTRAST TECHNIQUE: Contiguous axial images were obtained from the base of the skull through the vertex without intravenous contrast. COMPARISON:  Head CT 04/22/2004 FINDINGS: Brain: No evidence of acute intracranial hemorrhage or extra-axial collection.No evidence of mass  lesion/concern mass effect.The ventricles are normal in size. Vascular: No hyperdense vessel or unexpected calcification. Skull: Normal. Negative for fracture or focal lesion. Sinuses/Orbits: Near complete opacification of the left sphenoid sinus and maxillary sinuses with frothy material. There is mucosal thickening in the frontal sinus ethmoid air cells and right sphenoid sinus. Other: Small posterior scalp hematoma. IMPRESSION: No acute intracranial abnormality.  Small posterior scalp hematoma. Paranasal sinus disease as described above, correlate for symptoms of acute sinusitis. Electronically Signed   By: Maurine Simmering M.D.   On: 05/24/2021 15:29    ____________________________________________   PROCEDURES  Procedure(s) performed (including Critical Care):  Procedures  ____________________________________________   INITIAL IMPRESSION / ASSESSMENT AND PLAN     80 year old female presenting to the emergency department for treatment and evaluation after sustaining a fall at home.  See HPI for further details.  Patient denies loss of consciousness.  She is unsure what caused her fall.  She states that she has had some dizziness as of late but also states that she has had some recurring sinus congestion and drainage.  Plan will be to get some labs to check for electrolyte disturbances, anemia, and get an ECG to evaluate for arrhythmia.  DIFFERENTIAL DIAGNOSIS  Simple fall, dizziness, sinusitis, anemia, orthostasis  ED COURSE  CT head shows bilateral sinusitis.  As the patient states that this is been present for more than a month, she will be treated with Augmentin.  Her hemoglobin is down to 8.9 with hematocrit of 27.7 which was normal about 1 year ago.  She is Hemoccult positive however no bright red blood per rectum. She believes last colonoscopy was about 5 years ago.   Plan will be to have her follow-up with her primary care on Monday as already scheduled.  She was advised to return to  the emergency department if she has any bright red blood per rectum or if she becomes increasingly dizzy or sustains additional falls.  Patient is agreeable to the plan.    ___________________________________________   FINAL CLINICAL IMPRESSION(S) / ED DIAGNOSES  Final diagnoses:  Anemia due to GI blood loss  Acute pansinusitis, recurrence not specified     ED Discharge Orders          Ordered    amoxicillin-clavulanate (AUGMENTIN) 875-125 MG tablet  2 times daily  05/24/21 [name] was evaluated in Emergency Department on 05/24/2021 for the symptoms described in the history of present illness. She was evaluated in the context of the global COVID-19 pandemic, which necessitated consideration that the patient might be at risk for infection with the SARS-CoV-2 virus that causes COVID-19. Institutional protocols and algorithms that pertain to the evaluation of patients at risk for COVID-19 are in a state of rapid change based on information released by regulatory bodies including the CDC and federal and state organizations. These policies and algorithms were followed during the patient's care in the ED.   Note:  This document was prepared using Dragon voice recognition software and may include unintentional dictation errors.    Victorino Dike, FNP 05/24/21 2308    Harvest Dark, MD 05/24/21 2326

## 2021-05-27 ENCOUNTER — Ambulatory Visit: Payer: Medicare HMO | Admitting: Family Medicine

## 2021-05-27 NOTE — Telephone Encounter (Signed)
Reviewed, schedule. Pt not longer scheduled to see me 05/27/21  Routing to front office staff to have pt scheduled with [facility] next available or today if worsening symptoms since ER visit. Should have follow-up within 1 week of ER visit on 05/24/2021

## 2021-05-27 NOTE — Telephone Encounter (Signed)
Pt seen in ER on 05/24/2021 for anemia and sinus infection. Will assess at todays visit

## 2021-05-28 ENCOUNTER — Encounter: Payer: Self-pay | Admitting: Primary Care

## 2021-05-28 ENCOUNTER — Ambulatory Visit (INDEPENDENT_AMBULATORY_CARE_PROVIDER_SITE_OTHER): Payer: Medicare HMO | Admitting: Primary Care

## 2021-05-28 ENCOUNTER — Other Ambulatory Visit: Payer: Self-pay

## 2021-05-28 VITALS — BP 144/82 | HR 90 | Temp 98.6°F | Ht 65.0 in | Wt 184.0 lb

## 2021-05-28 DIAGNOSIS — K219 Gastro-esophageal reflux disease without esophagitis: Secondary | ICD-10-CM | POA: Diagnosis not present

## 2021-05-28 DIAGNOSIS — K921 Melena: Secondary | ICD-10-CM

## 2021-05-28 DIAGNOSIS — J0140 Acute pansinusitis, unspecified: Secondary | ICD-10-CM

## 2021-05-28 DIAGNOSIS — F411 Generalized anxiety disorder: Secondary | ICD-10-CM

## 2021-05-28 DIAGNOSIS — R69 Illness, unspecified: Secondary | ICD-10-CM | POA: Diagnosis not present

## 2021-05-28 HISTORY — DX: Melena: K92.1

## 2021-05-28 MED ORDER — OMEPRAZOLE 40 MG PO CPDR: 40.0000 mg | DELAYED_RELEASE_CAPSULE | Freq: Every day | ORAL | 3 refills | Status: DC

## 2021-05-28 MED ORDER — ESCITALOPRAM OXALATE 10 MG PO TABS: 10.0000 mg | ORAL_TABLET | Freq: Every day | ORAL | 3 refills | Status: DC

## 2021-05-28 MED ORDER — FLONASE SENSIMIST 27.5 MCG/SPRAY NA SUSP: 1.0000 | Freq: Two times a day (BID) | NASAL | 0 refills | Status: DC

## 2021-05-28 NOTE — Progress Notes (Signed)
Subjective:    Patient ID: ATZIRY [name], female    DOB: [birth year], 80 y.o.   MRN: [id number]  HPI  [name] is a very pleasant 80 y.o. female with a history of who presents today   She presented to [facility] ED on 05/24/21 with symptoms of dizziness after falling backwards striking her head on hardwood flooring at home. She was getting up to use the bathroom, fell backwards.   Work up in the ED showed bilateral sinusitis (from CT scan) so she was treated with Augmentin antibiotics. Labs revealed a decline in hemoglobin to 8.9 from one year prior. Her hemoccult card was positive, no rectal bleeding on exam. CT head negative for bleeding. She was discharged home later that day, asked to follow up with PCP.  Today she endorses a 2-3 month history of dark stools. She feels dizzy with position changes that began a few months ago. Her occipital head is sore from her fall. She is pulling out thick, green, bloody mucous from her nasal cavity.   She does not take oral iron or NSAID's. She is taking aspirin 81 mg daily. She denies abdominal pain, nausea, vomiting, fevers, decrease in appetite. She is compliant to her Augmentin BID. She is compliant to her omeprazole 40 mg daily. She does enjoy eating ice. History of chronic diarrhea, dates back decades.   BP Readings from Last 3 Encounters:  05/28/21 (!) 144/82  05/24/21 (!) 154/74  05/07/21 132/72     Review of Systems  Constitutional:  Negative for chills and fever.  Respiratory:  Negative for shortness of breath.   Gastrointestinal:  Positive for diarrhea. Negative for abdominal pain, constipation, nausea and vomiting.       Dark stools        Past Medical History:  Diagnosis Date   Adenocarcinoma, breast ([place])    Anxiety    Colonic polyp    5 mm adenoma 1977   Degenerative joint disease    Fibromyalgia    GERD (gastroesophageal reflux disease)    Hemorrhoids    History of anemia    History of shingles     Hyperlipidemia    Hypertension    Hypothyroidism    IBS (irritable bowel syndrome)    NASH (nonalcoholic steatohepatitis)    Osteopenia    Personal history of chemotherapy 2005   Left Breast Cancer   Personal history of radiation therapy 2005   Left Breast Cancer   UTI (urinary tract infection) 03/01/2015    Social History   Socioeconomic History   Marital status: Married    Spouse name: [name]   Number of children: 2   Years of education: Not on file   Highest education level: Not on file  Occupational History   Occupation: retired  Tobacco Use   Smoking status: Former    Packs/day: 0.20    Years: 7.00    Pack years: 1.40    Types: Cigarettes    Quit date: 09/29/1970    Years since quitting: 50.6   Smokeless tobacco: Never   Tobacco comments:    1 pack per week  Vaping Use   Vaping Use: Never used  Substance and Sexual Activity   Alcohol use: Not Currently   Drug use: No   Sexual activity: Yes  Other Topics Concern   Not on file  Social History Narrative   Would desire CPR   Social Determinants of Health   Financial Resource Strain: Not on file  Food Insecurity:  No Food Insecurity   Worried About Charity fundraiser in the Last Year: Never true   Ran Out of Food in the Last Year: Never true  Transportation Needs: No Transportation Needs   Lack of Transportation (Medical): No   Lack of Transportation (Non-Medical): No  Physical Activity: Not on file  Stress: Not on file  Social Connections: Not on file  Intimate Partner Violence: Not on file    Past Surgical History:  Procedure Laterality Date   ABDOMINAL HYSTERECTOMY     APPENDECTOMY     BILATERAL SALPINGOOPHORECTOMY  1978   BREAST LUMPECTOMY Left 2005   left--node dissection 7/05 by Dr. Charlynne Pander   LAPAROSCOPIC CHOLECYSTECTOMY     ROTATOR CUFF REPAIR     left    Family History  Problem Relation Age of Onset   Prostate cancer Father    Prostate cancer Brother    Stroke Mother    Aneurysm  Brother    Emphysema Sister    Crohn's disease Sister    Liver disease Brother    Hypertension Brother     Allergies  Allergen Reactions   Ciprofloxacin Diarrhea   Codeine     REACTION: nausea and vomiting    Current Outpatient Medications on File Prior to Visit  Medication Sig Dispense Refill   amoxicillin-clavulanate (AUGMENTIN) 875-125 MG tablet Take 1 tablet by mouth 2 (two) times daily for 10 days. 20 tablet 0   aspirin 81 MG EC tablet Take 81 mg by mouth daily.       escitalopram (LEXAPRO) 10 MG tablet Take 1 tablet (10 mg total) by mouth daily. For anxiety. 90 tablet 0   hydrOXYzine (ATARAX/VISTARIL) 10 MG tablet TAKE 1 TABLET BY MOUTH EVERY DAY AS NEEDED FOR ANXIETY 90 tablet 0   levothyroxine (SYNTHROID) 75 MCG tablet TAKE 1 TABLET BY MOUTH EVERY DAY 90 tablet 1   lisinopril (ZESTRIL) 20 MG tablet TAKE 1 TABLET (20 MG TOTAL) BY MOUTH DAILY. FOR BLOOD PRESSURE. 90 tablet 1   omeprazole (PRILOSEC) 40 MG capsule TAKE 1 CAPSULE BY MOUTH EVERY DAY FOR HEARTBURN. 90 capsule 0   No current facility-administered medications on file prior to visit.    BP (!) 144/82   Pulse 90   Temp 98.6 F (37 C) (Temporal)   Ht '5\' 5"'$  (1.651 m)   Wt 184 lb (83.5 kg)   SpO2 97%   BMI 30.62 kg/m  Objective:   Physical Exam Eyes:     Extraocular Movements: Extraocular movements intact.  Cardiovascular:     Rate and Rhythm: Normal rate and regular rhythm.  Pulmonary:     Effort: Pulmonary effort is normal.     Breath sounds: Normal breath sounds.  Musculoskeletal:     Cervical back: Neck supple.  Skin:    General: Skin is warm and dry.  Neurological:     Mental Status: She is alert and oriented to person, place, and time.     Cranial Nerves: No cranial nerve deficit.  Psychiatric:        Mood and Affect: Mood normal.          Assessment & Plan:      This visit occurred during the SARS-CoV-2 public health emergency.  Safety protocols were in place, including screening  questions prior to the visit, additional usage of staff PPE, and extensive cleaning of exam room while observing appropriate contact time as indicated for disinfecting solutions.

## 2021-05-28 NOTE — Assessment & Plan Note (Signed)
Dark stools x 2-3 months, positive hemoccult stool card with anemia.   Referral placed to GI. Stop aspirin. Continue PPI. Start oral iron.  ED notes, labs, imaging reviewed.

## 2021-05-28 NOTE — Assessment & Plan Note (Signed)
Pansinusitis noted on CT head, continue Augmentin antibiotics. Rx for Flonase sensimist provided.

## 2021-05-28 NOTE — Patient Instructions (Addendum)
You will be contacted regarding your referral to GI for evaluation of your anemia and bleeding.  Please let us know if you have not been contacted within two weeks.   Stop aspirin 81 mg daily.   Start ferrous sulfate 325 mg once daily for iron levels.   Continue taking omeprazole 40 mg daily.   It was a pleasure to see you today!

## 2021-05-30 ENCOUNTER — Encounter: Payer: Self-pay | Admitting: Gastroenterology

## 2021-05-30 ENCOUNTER — Encounter: Payer: Self-pay | Admitting: Physician Assistant

## 2021-05-30 ENCOUNTER — Ambulatory Visit (INDEPENDENT_AMBULATORY_CARE_PROVIDER_SITE_OTHER): Payer: Medicare HMO | Admitting: Physician Assistant

## 2021-05-30 ENCOUNTER — Other Ambulatory Visit (INDEPENDENT_AMBULATORY_CARE_PROVIDER_SITE_OTHER): Payer: Medicare HMO

## 2021-05-30 VITALS — BP 130/70 | HR 57 | Ht 65.0 in | Wt 182.0 lb

## 2021-05-30 DIAGNOSIS — K921 Melena: Secondary | ICD-10-CM | POA: Diagnosis not present

## 2021-05-30 DIAGNOSIS — D509 Iron deficiency anemia, unspecified: Secondary | ICD-10-CM

## 2021-05-30 DIAGNOSIS — K219 Gastro-esophageal reflux disease without esophagitis: Secondary | ICD-10-CM

## 2021-05-30 LAB — CBC WITH DIFFERENTIAL/PLATELET
Basophils Absolute: 0.1 10*3/uL (ref 0.0–0.1)
Basophils Relative: 1.2 % (ref 0.0–3.0)
Eosinophils Absolute: 0.1 10*3/uL (ref 0.0–0.7)
Eosinophils Relative: 1 % (ref 0.0–5.0)
HCT: 28.9 % — ABNORMAL LOW (ref 36.0–46.0)
Hemoglobin: 9.2 g/dL — ABNORMAL LOW (ref 12.0–15.0)
Lymphocytes Relative: 12.3 % (ref 12.0–46.0)
Lymphs Abs: 0.9 10*3/uL (ref 0.7–4.0)
MCHC: 31.9 g/dL (ref 30.0–36.0)
MCV: 75.8 fl — ABNORMAL LOW (ref 78.0–100.0)
Monocytes Absolute: 0.8 10*3/uL (ref 0.1–1.0)
Monocytes Relative: 10.5 % (ref 3.0–12.0)
Neutro Abs: 5.3 10*3/uL (ref 1.4–7.7)
Neutrophils Relative %: 75 % (ref 43.0–77.0)
Platelets: 251 10*3/uL (ref 150.0–400.0)
RBC: 3.82 Mil/uL — ABNORMAL LOW (ref 3.87–5.11)
RDW: 17.5 % — ABNORMAL HIGH (ref 11.5–15.5)
WBC: 7.1 10*3/uL (ref 4.0–10.5)

## 2021-05-30 LAB — COMPREHENSIVE METABOLIC PANEL
ALT: 22 U/L (ref 0–35)
AST: 37 U/L (ref 0–37)
Albumin: 3.2 g/dL — ABNORMAL LOW (ref 3.5–5.2)
Alkaline Phosphatase: 112 U/L (ref 39–117)
BUN: 12 mg/dL (ref 6–23)
CO2: 22 mEq/L (ref 19–32)
Calcium: 9.2 mg/dL (ref 8.4–10.5)
Chloride: 106 mEq/L (ref 96–112)
Creatinine, Ser: 0.89 mg/dL (ref 0.40–1.20)
GFR: 60.84 mL/min (ref >60.00)
Glucose, Bld: 117 mg/dL — ABNORMAL HIGH (ref 70–99)
Potassium: 3.6 mEq/L (ref 3.5–5.1)
Sodium: 137 mEq/L (ref 135–145)
Total Bilirubin: 0.6 mg/dL (ref 0.2–1.2)
Total Protein: 7.1 g/dL (ref 6.0–8.3)

## 2021-05-30 MED ORDER — OMEPRAZOLE 40 MG PO CPDR: 40.0000 mg | DELAYED_RELEASE_CAPSULE | Freq: Two times a day (BID) | ORAL | 5 refills | Status: DC

## 2021-05-30 NOTE — Patient Instructions (Signed)
Your provider has requested that you go to the basement level for lab work before leaving today. Press "B" on the elevator. The lab is located at the first door on the left as you exit the elevator.  We have sent the following medications to your pharmacy for you to pick up at your convenience: Omeprazole 40 mg twice daily 30-60 minutes before breakfast and dinner.   You have been scheduled for an endoscopy. Please follow written instructions given to you at your visit today. If you use inhalers (even only as needed), please bring them with you on the day of your procedure.  If you are age 84 or older, your body mass index should be between 23-30. Your Body mass index is 30.29 kg/m. If this is out of the aforementioned range listed, please consider follow up with your Primary Care Provider.  If you are age 85 or younger, your body mass index should be between 19-25. Your Body mass index is 30.29 kg/m. If this is out of the aformentioned range listed, please consider follow up with your Primary Care Provider.   __________________________________________________________  The [facility] providers would like to encourage you to use [facility] to communicate with providers for non-urgent requests or questions.  Due to long hold times on the telephone, sending your provider a message by [facility] may be a faster and more efficient way to get a response.  Please allow 48 business hours for a response.  Please remember that this is for non-urgent requests.

## 2021-05-30 NOTE — Progress Notes (Signed)
Agree with the assessment and plan as outlined by Ellouise Newer, PA-C. Agree with plan for additional labs as outlined and expedited EGD tomorrow for diagnostic and potentially therapeutic intent.   Shantika Bermea, DO, [facility]

## 2021-05-30 NOTE — Progress Notes (Signed)
Chief Complaint: Follow-up after ER visit for anemia  HPI:    [name] is an 80 year old female with a past medical history as listed below including fibromyalgia, GERD, IBS, NASH and multiple others, known to Dr. Carlean Purl, who was referred to me by [name], NP for a complaint of anemia.    11/30/2003 EGD with a hiatal hernia and a mildly tortuous esophagus.  Otherwise normal.    12/06/2009 colonoscopy normal, had a history of a diminutive adenoma removed in 1997, follow-up recommended 10 years    05/24/2021 patient seen in the ER in [place] after a fall.  At that time CMP with a sodium of 134, CBC with a hemoglobin of 8.9 (normal year ago), MCV low at 77.8.  CT of the head without contrast showed no acute intracranial abnormality.  She did have paranasal sinus disease.  Patient was Hemoccult positive with no bright red blood per rectum.  Is recommended she follow-up with her PCP in 4 days.    05/28/2021 patient saw PCP and at that time discussed 2 to 70-monthhistory of dark stools.  Described feeling dizzy when she change positions.  She was on Aspirin 81 mg daily.  Currently taking Augmentin twice daily for sinusitis and Omeprazole 40 mg daily.  At that time patient was told to stop her aspirin, continue PPI and start iron supplementation.    Today, the patient presents to clinic accompanied by her daughter who does assist with history.  Together they explain that the patient has been getting dizzy lately.  She tells me that she has noticed at least 4 weeks of a dark-colored stool but thought this was due to some cereal she was eating so really did not take note of it.  Describes a least 2-3 bowel movements a day which are very urgent.  Explains that she then fell when she was turning a corner, she just fell straight back.  This prompted an ER visit and a CT of her head.  During that time she was found to have a low hemoglobin as above.  Tells me she chronically takes Omeprazole 40 mg once  daily and does not describe any breakthrough symptoms of reflux.  Denies any NSAID use.  Associated symptoms include dizziness typically with a change in position.    Denies fever, chills, weight loss, bright red blood in her stool, nausea, vomiting or symptoms that awaken her from sleep.     Past Medical History:  Diagnosis Date   Adenocarcinoma, breast ([place]    Anxiety    Colonic polyp    5 mm adenoma 1977   Degenerative joint disease    Fibromyalgia    GERD (gastroesophageal reflux disease)    Hemorrhoids    History of anemia    History of shingles    Hyperlipidemia    Hypertension    Hypothyroidism    IBS (irritable bowel syndrome)    NASH (nonalcoholic steatohepatitis)    Osteopenia    Personal history of chemotherapy 2005   Left Breast Cancer   Personal history of radiation therapy 2005   Left Breast Cancer   UTI (urinary tract infection) 03/01/2015    Past Surgical History:  Procedure Laterality Date   ABDOMINAL HYSTERECTOMY     APPENDECTOMY     BILATERAL SALPINGOOPHORECTOMY  1978   BREAST LUMPECTOMY Left 2005   left--node dissection 7/05 by Dr. PCharlynne Pander  LAPAROSCOPIC CHOLECYSTECTOMY     ROTATOR CUFF REPAIR     left  Current Outpatient Medications  Medication Sig Dispense Refill   amoxicillin-clavulanate (AUGMENTIN) 875-125 MG tablet Take 1 tablet by mouth 2 (two) times daily for 10 days. 20 tablet 0   aspirin 81 MG EC tablet Take 81 mg by mouth daily.       escitalopram (LEXAPRO) 10 MG tablet Take 1 tablet (10 mg total) by mouth daily. For anxiety. 90 tablet 3   fluticasone (FLONASE SENSIMIST) 27.5 MCG/SPRAY nasal spray Place 1 spray into the nose 2 (two) times daily. 10 g 0   hydrOXYzine (ATARAX/VISTARIL) 10 MG tablet TAKE 1 TABLET BY MOUTH EVERY DAY AS NEEDED FOR ANXIETY 90 tablet 0   levothyroxine (SYNTHROID) 75 MCG tablet TAKE 1 TABLET BY MOUTH EVERY DAY 90 tablet 1   lisinopril (ZESTRIL) 20 MG tablet TAKE 1 TABLET (20 MG TOTAL) BY MOUTH DAILY. FOR BLOOD  PRESSURE. 90 tablet 1   omeprazole (PRILOSEC) 40 MG capsule Take 1 capsule (40 mg total) by mouth daily. For heartburn 90 capsule 3   No current facility-administered medications for this visit.    Allergies as of 05/30/2021 - Review Complete 05/28/2021  Allergen Reaction Noted   Ciprofloxacin Diarrhea 07/03/2015   Codeine      Family History  Problem Relation Age of Onset   Prostate cancer Father    Prostate cancer Brother    Stroke Mother    Aneurysm Brother    Emphysema Sister    Crohn's disease Sister    Liver disease Brother    Hypertension Brother     Social History   Socioeconomic History   Marital status: Married    Spouse name: [name]   Number of children: 2   Years of education: Not on file   Highest education level: Not on file  Occupational History   Occupation: retired  Tobacco Use   Smoking status: Former    Packs/day: 0.20    Years: 7.00    Pack years: 1.40    Types: Cigarettes    Quit date: 09/29/1970    Years since quitting: 50.7   Smokeless tobacco: Never   Tobacco comments:    1 pack per week  Vaping Use   Vaping Use: Never used  Substance and Sexual Activity   Alcohol use: Not Currently   Drug use: No   Sexual activity: Yes  Other Topics Concern   Not on file  Social History Narrative   Would desire CPR   Social Determinants of Health   Financial Resource Strain: Not on file  Food Insecurity: No Food Insecurity   Worried About Running Out of Food in the Last Year: Never true   [place] in the Last Year: Never true  Transportation Needs: No Transportation Needs   Lack of Transportation (Medical): No   Lack of Transportation (Non-Medical): No  Physical Activity: Not on file  Stress: Not on file  Social Connections: Not on file  Intimate Partner Violence: Not on file    Review of Systems:    Constitutional: No weight loss, fever or chills Skin: No rash  Cardiovascular: No chest pain Respiratory: No  SOB Gastrointestinal: See HPI and otherwise negative Genitourinary: No dysuria  Neurological: +dizziness Musculoskeletal: No new muscle or joint pain Hematologic: No bruising Psychiatric: No history of depression or anxiety   Physical Exam:  Vital signs: BP 130/70   Pulse (!) 57   Ht '5\' 5"'$  (1.651 m)   Wt 182 lb (82.6 kg)   BMI 30.29 kg/m    Constitutional:  Pleasant Caucasian female appears to be in NAD, Well developed, Well nourished, alert and cooperative Head:  Normocephalic and atraumatic. Eyes:   PEERL, EOMI. No icterus. Conjunctiva pink. Ears:  Normal auditory acuity. Neck:  Supple Throat: Oral cavity and pharynx without inflammation, swelling or lesion.  Respiratory: Respirations even and unlabored. Lungs clear to auscultation bilaterally.   No wheezes, crackles, or rhonchi.  Cardiovascular: Normal S1, S2. No MRG. Regular rate and rhythm. No peripheral edema, cyanosis or pallor.  Gastrointestinal:  Soft, nondistended, mild epigastric ttp, No rebound or guarding. Normal bowel sounds. No appreciable masses or hepatomegaly. Rectal:  Not performed.  Msk:  Symmetrical without gross deformities. Without edema, no deformity or joint abnormality.  Neurologic:  Alert and  oriented x4;  grossly normal neurologically.  Skin:   Dry and intact without significant lesions or rashes. Psychiatric: Demonstrates good judgement and reason without abnormal affect or behaviors.  RELEVANT LABS AND IMAGING: CBC    Component Value Date/Time   WBC 6.5 05/24/2021 1633   RBC 3.56 (L) 05/24/2021 1633   HGB 8.9 (L) 05/24/2021 1633   HGB 13.2 04/18/2010 0904   HCT 27.7 (L) 05/24/2021 1633   HCT 38.2 04/18/2010 0904   PLT 215 05/24/2021 1633   PLT 260 04/18/2010 0904   MCV 77.8 (L) 05/24/2021 1633   MCV 85.2 04/18/2010 0904   MCH 25.0 (L) 05/24/2021 1633   MCHC 32.1 05/24/2021 1633   RDW 16.7 (H) 05/24/2021 1633   RDW 14.7 (H) 04/18/2010 0904   LYMPHSABS 0.8 05/24/2021 1633   LYMPHSABS  2.2 04/18/2010 0904   MONOABS 0.6 05/24/2021 1633   MONOABS 0.9 04/18/2010 0904   EOSABS 0.0 05/24/2021 1633   EOSABS 0.2 04/18/2010 0904   BASOSABS 0.0 05/24/2021 1633   BASOSABS 0.0 04/18/2010 0904    CMP     Component Value Date/Time   NA 134 (L) 05/24/2021 1633   K 4.1 05/24/2021 1633   CL 107 05/24/2021 1633   CO2 22 05/24/2021 1633   GLUCOSE 123 (H) 05/24/2021 1633   BUN 16 05/24/2021 1633   CREATININE 0.91 05/24/2021 1633   CALCIUM 8.9 05/24/2021 1633   PROT 6.6 05/24/2021 1633   ALBUMIN 2.9 (L) 05/24/2021 1633   AST 38 05/24/2021 1633   ALT 23 05/24/2021 1633   ALKPHOS 87 05/24/2021 1633   BILITOT 0.8 05/24/2021 1633   GFRNONAA >60 05/24/2021 1633   GFRAA 92 12/08/2008 1009    Assessment: 1.  Melena: 2 dark looking stools a day over the past 3 to 4 weeks, was on ASA 81 mg once daily just discontinued by her PCP recently due to suspected bleeding; consider upper GI bleed 2.  Anemia: Hemoglobin recently noted to be 8.9 in the ER after a fall, patient with dizziness and description of melena above; likely upper GI bleed  Plan: 1.  Scheduled patient for an EGD tomorrow afternoon.  This was scheduled with Dr. Bryan Lemma.  Did provide the patient with a detailed list of risks for the procedure and she agrees to proceed. Patient is appropriate for endoscopic procedure(s) in the ambulatory ([place]) setting.  2.  We will recheck CBC, CMP and iron studies today.  Did discuss with the patient that if her hemoglobin is less than 7 then we will need to cancel procedure for tomorrow and likely she will require blood transfusion prior to rescheduling. 3.  Increased Omeprazole to 40 mg twice daily, 30-60 minutes for breakfast and dinner.  Prescribed #60 with 1 refill. 4.  Patient to follow in clinic per recommendations after time of procedures tomorrow.  Ellouise Newer, PA-C [facility] 05/30/2021, 3:22 PM  Cc: [name], NP

## 2021-05-31 ENCOUNTER — Other Ambulatory Visit (INDEPENDENT_AMBULATORY_CARE_PROVIDER_SITE_OTHER): Payer: Medicare HMO

## 2021-05-31 ENCOUNTER — Other Ambulatory Visit: Payer: Self-pay

## 2021-05-31 ENCOUNTER — Encounter: Payer: Self-pay | Admitting: Gastroenterology

## 2021-05-31 ENCOUNTER — Telehealth: Payer: Self-pay

## 2021-05-31 ENCOUNTER — Other Ambulatory Visit: Payer: Self-pay | Admitting: Gastroenterology

## 2021-05-31 ENCOUNTER — Ambulatory Visit (AMBULATORY_SURGERY_CENTER): Payer: Medicare HMO | Admitting: Gastroenterology

## 2021-05-31 VITALS — BP 122/55 | HR 72 | Temp 98.0°F | Resp 17 | Ht 65.0 in | Wt 182.0 lb

## 2021-05-31 DIAGNOSIS — D509 Iron deficiency anemia, unspecified: Secondary | ICD-10-CM | POA: Diagnosis not present

## 2021-05-31 DIAGNOSIS — K7460 Unspecified cirrhosis of liver: Secondary | ICD-10-CM

## 2021-05-31 DIAGNOSIS — K31819 Angiodysplasia of stomach and duodenum without bleeding: Secondary | ICD-10-CM | POA: Diagnosis not present

## 2021-05-31 DIAGNOSIS — K2990 Gastroduodenitis, unspecified, without bleeding: Secondary | ICD-10-CM | POA: Diagnosis not present

## 2021-05-31 DIAGNOSIS — I8500 Esophageal varices without bleeding: Secondary | ICD-10-CM

## 2021-05-31 DIAGNOSIS — K921 Melena: Secondary | ICD-10-CM

## 2021-05-31 DIAGNOSIS — K317 Polyp of stomach and duodenum: Secondary | ICD-10-CM | POA: Diagnosis not present

## 2021-05-31 DIAGNOSIS — I10 Essential (primary) hypertension: Secondary | ICD-10-CM | POA: Diagnosis not present

## 2021-05-31 DIAGNOSIS — K219 Gastro-esophageal reflux disease without esophagitis: Secondary | ICD-10-CM

## 2021-05-31 DIAGNOSIS — D649 Anemia, unspecified: Secondary | ICD-10-CM

## 2021-05-31 DIAGNOSIS — I998 Other disorder of circulatory system: Secondary | ICD-10-CM

## 2021-05-31 DIAGNOSIS — K2970 Gastritis, unspecified, without bleeding: Secondary | ICD-10-CM

## 2021-05-31 LAB — FERRITIN: Ferritin: 16.8 ng/mL (ref 10.0–291.0)

## 2021-05-31 LAB — IRON: Iron: 8 ug/dL — ABNORMAL LOW (ref 42–145)

## 2021-05-31 LAB — PROTIME-INR
INR: 1.4 ratio — ABNORMAL HIGH (ref 0.8–1.0)
Prothrombin Time: 15.2 s — ABNORMAL HIGH (ref 9.6–13.1)

## 2021-05-31 LAB — VITAMIN B12: Vitamin B-12: 748 pg/mL (ref 211–911)

## 2021-05-31 LAB — IBC PANEL
Iron: 9 ug/dL — ABNORMAL LOW (ref 42–145)
Saturation Ratios: 2.3 % — ABNORMAL LOW (ref 20.0–50.0)
TIBC: 392 ug/dL (ref 250.0–450.0)
Transferrin: 280 mg/dL (ref 212.0–360.0)

## 2021-05-31 LAB — FOLATE: Folate: >24.4 ng/mL (ref >5.9)

## 2021-05-31 MED ORDER — SODIUM CHLORIDE 0.9 % IV SOLN
500.0000 mL | Freq: Once | INTRAVENOUS | Status: DC
Start: 2021-05-31 — End: 2021-05-31

## 2021-05-31 NOTE — Progress Notes (Signed)
Report given to PACU, vss 

## 2021-05-31 NOTE — Patient Instructions (Addendum)
Resume previous medications.  Resume previous diet.  Repeat upper endoscopy at [facility] on 06/05/21 at 130 pm.   Arrive at 12:00 pm.    Labs today.  Ultrasound to be scheduled by office.    YOU HAD AN ENDOSCOPIC PROCEDURE TODAY AT [facility]:   Refer to the procedure report that was given to you for any specific questions about what was found during the examination.  If the procedure report does not answer your questions, please call your gastroenterologist to clarify.  If you requested that your care partner not be given the details of your procedure findings, then the procedure report has been included in a sealed envelope for you to review at your convenience later.  YOU SHOULD EXPECT: Some feelings of bloating in the abdomen. Passage of more gas than usual.  Walking can help get rid of the air that was put into your GI tract during the procedure and reduce the bloating. If you had a lower endoscopy (such as a colonoscopy or flexible sigmoidoscopy) you may notice spotting of blood in your stool or on the toilet paper. If you underwent a bowel prep for your procedure, you may not have a normal bowel movement for a few days.  Please Note:  You might notice some irritation and congestion in your nose or some drainage.  This is from the oxygen used during your procedure.  There is no need for concern and it should clear up in a day or so.  SYMPTOMS TO REPORT IMMEDIATELY:  Following upper endoscopy (EGD)  Vomiting of blood or coffee ground material  New chest pain or pain under the shoulder blades  Painful or persistently difficult swallowing  New shortness of breath  Fever of 100F or higher  Black, tarry-looking stools  For urgent or emergent issues, a gastroenterologist can be reached at any hour by calling [phone]. Do not use MyChart messaging for urgent concerns.    DIET:  We do recommend a small meal at first, but then you may proceed to your regular  diet.  Drink plenty of fluids but you should avoid alcoholic beverages for 24 hours.  ACTIVITY:  You should plan to take it easy for the rest of today and you should NOT DRIVE or use heavy machinery until tomorrow (because of the sedation medicines used during the test).    FOLLOW UP: Our staff will call the number listed on your records 48-72 hours following your procedure to check on you and address any questions or concerns that you may have regarding the information given to you following your procedure. If we do not reach you, we will leave a message.  We will attempt to reach you two times.  During this call, we will ask if you have developed any symptoms of COVID 19. If you develop any symptoms (ie: fever, flu-like symptoms, shortness of breath, cough etc.) before then, please call [phone].  If you test positive for Covid 19 in the 2 weeks post procedure, please call and report this information to [place].    If any biopsies were taken you will be contacted by phone or by letter within the next 1-3 weeks.  Please call us at [phone] if you have not heard about the biopsies in 3 weeks.    SIGNATURES/CONFIDENTIALITY: You and/or your care partner have signed paperwork which will be entered into your electronic medical record.  These signatures attest to the fact that that the information above on  your After Visit Summary has been reviewed and is understood.  Full responsibility of the confidentiality of this discharge information lies with you and/or your care-partner.

## 2021-05-31 NOTE — Telephone Encounter (Signed)
Yes, US liver doppler. NO elastography needed. Looking for cirrhosis and any obstruction of the portal vein. Thanks.    US liver doppler orders in epic.

## 2021-05-31 NOTE — Progress Notes (Signed)
GASTROENTEROLOGY PROCEDURE H&P NOTE   Primary Care Physician: [name], NP    Reason for Procedure:   Iron deficiency anemia, melena, symptomatic anemia  Plan:    EGD  Patient is appropriate for endoscopic procedure(s) in the ambulatory ([place]) setting.  The nature of the procedure, as well as the risks, benefits, and alternatives were carefully and thoroughly reviewed with the patient. Ample time for discussion and questions allowed. The patient understood, was satisfied, and agreed to proceed.     HPI: [name] is a 80 y.o. female who presents for expedited EGD for evaluation and potential treatment of IDA and melena. She was seen in the GI Clinic by Ellouise Newer for this issue on 05/30/2021 with plan for expedited EGD for diagnostic and therapeutic intent. Holding ASA and increased PPI to Prilosec 40 mg BID yesterday. Please see yesterday's note for additional details. Otehrwise, no changes in clinical hx in the interim.   Past Medical History:  Diagnosis Date   Adenocarcinoma, breast ([place])    Anxiety    Colonic polyp    5 mm adenoma 1977   Degenerative joint disease    Fibromyalgia    GERD (gastroesophageal reflux disease)    Hemorrhoids    History of anemia    History of shingles    Hyperlipidemia    Hypertension    Hypothyroidism    IBS (irritable bowel syndrome)    NASH (nonalcoholic steatohepatitis)    Osteopenia    Personal history of chemotherapy 2005   Left Breast Cancer   Personal history of radiation therapy 2005   Left Breast Cancer   UTI (urinary tract infection) 03/01/2015    Past Surgical History:  Procedure Laterality Date   ABDOMINAL HYSTERECTOMY     APPENDECTOMY     BILATERAL SALPINGOOPHORECTOMY  1978   BREAST LUMPECTOMY Left 2005   left--node dissection 7/05 by Dr. Charlynne Pander   LAPAROSCOPIC CHOLECYSTECTOMY     ROTATOR CUFF REPAIR     left    Prior to Admission medications   Medication Sig Start Date End Date Taking?  Authorizing Provider  amoxicillin-clavulanate (AUGMENTIN) 875-125 MG tablet Take 1 tablet by mouth 2 (two) times daily for 10 days. 05/24/21 06/03/21 Yes Triplett, Cari B, FNP  escitalopram (LEXAPRO) 10 MG tablet Take 1 tablet (10 mg total) by mouth daily. For anxiety. 05/28/21  Yes [name], NP  Ferrous Sulfate (IRON PO) Take by mouth. One daily   Yes [provider]  fluticasone (FLONASE SENSIMIST) 27.5 MCG/SPRAY nasal spray Place 1 spray into the nose 2 (two) times daily. 05/28/21  Yes [name], NP  hydrOXYzine (ATARAX/VISTARIL) 10 MG tablet TAKE 1 TABLET BY MOUTH EVERY DAY AS NEEDED FOR ANXIETY 05/24/19  Yes [name], NP  levothyroxine (SYNTHROID) 75 MCG tablet TAKE 1 TABLET BY MOUTH EVERY DAY 11/20/20  Yes [name], NP  lisinopril (ZESTRIL) 20 MG tablet TAKE 1 TABLET (20 MG TOTAL) BY MOUTH DAILY. FOR BLOOD PRESSURE. 05/10/21  Yes [name], NP  omeprazole (PRILOSEC) 40 MG capsule Take 1 capsule (40 mg total) by mouth 2 (two) times daily before a meal. 05/30/21  Yes Levin Erp, PA    Current Outpatient Medications  Medication Sig Dispense Refill   amoxicillin-clavulanate (AUGMENTIN) 875-125 MG tablet Take 1 tablet by mouth 2 (two) times daily for 10 days. 20 tablet 0   escitalopram (LEXAPRO) 10 MG tablet Take 1 tablet (10 mg total) by mouth daily. For  anxiety. 90 tablet 3   Ferrous Sulfate (IRON PO) Take by mouth. One daily     fluticasone (FLONASE SENSIMIST) 27.5 MCG/SPRAY nasal spray Place 1 spray into the nose 2 (two) times daily. 10 g 0   hydrOXYzine (ATARAX/VISTARIL) 10 MG tablet TAKE 1 TABLET BY MOUTH EVERY DAY AS NEEDED FOR ANXIETY 90 tablet 0   levothyroxine (SYNTHROID) 75 MCG tablet TAKE 1 TABLET BY MOUTH EVERY DAY 90 tablet 1   lisinopril (ZESTRIL) 20 MG tablet TAKE 1 TABLET (20 MG TOTAL) BY MOUTH DAILY. FOR BLOOD PRESSURE. 90 tablet 1   omeprazole (PRILOSEC) 40 MG capsule Take 1 capsule (40 mg total) by mouth 2 (two) times  daily before a meal. 60 capsule 5   Current Facility-Administered Medications  Medication Dose Route Frequency Provider Last Rate Last Admin   0.9 %  sodium chloride infusion  500 mL Intravenous Once Hershey Knauer V, DO        Allergies as of 05/31/2021 - Review Complete 05/31/2021  Allergen Reaction Noted   Ciprofloxacin Diarrhea 07/03/2015   Codeine      Family History  Problem Relation Age of Onset   Stroke Mother    Prostate cancer Father    Emphysema Sister    Crohn's disease Sister    Prostate cancer Brother    Aneurysm Brother    Liver disease Brother    Hypertension Brother     Social History   Socioeconomic History   Marital status: Married    Spouse name: [name]   Number of children: 2   Years of education: Not on file   Highest education level: Not on file  Occupational History   Occupation: retired  Tobacco Use   Smoking status: Former    Packs/day: 0.20    Years: 7.00    Pack years: 1.40    Types: Cigarettes    Quit date: 09/29/1970    Years since quitting: 50.7   Smokeless tobacco: Never   Tobacco comments:    1 pack per week  Vaping Use   Vaping Use: Never used  Substance and Sexual Activity   Alcohol use: Not Currently   Drug use: No   Sexual activity: Yes  Other Topics Concern   Not on file  Social History Narrative   Would desire CPR   Social Determinants of Health   Financial Resource Strain: Not on file  Food Insecurity: No Food Insecurity   Worried About [place] in the Last Year: Never true   [place] in the Last Year: Never true  Transportation Needs: No Transportation Needs   Lack of Transportation (Medical): No   Lack of Transportation (Non-Medical): No  Physical Activity: Not on file  Stress: Not on file  Social Connections: Not on file  Intimate Partner Violence: Not on file    Physical Exam: Vital signs in last 24 hours: '@BP'$  (!) 168/73   Pulse 81   Temp 98 F (36.7 C) (Skin)   Resp 19   Ht  '5\' 5"'$  (1.651 m)   Wt 182 lb (82.6 kg)   SpO2 98%   BMI 30.29 kg/m  GEN: NAD EYE: Sclerae anicteric ENT: MMM CV: Non-tachycardic Pulm: CTA b/l GI: Soft, NT/ND NEURO:  Alert & Oriented x 3   Gerrit Heck, DO [facility]   05/31/2021 11:46 AM

## 2021-05-31 NOTE — Progress Notes (Signed)
Patient to lab for blood work prior to d/c home.

## 2021-05-31 NOTE — Op Note (Signed)
[facility] Patient Name: [name] Procedure Date: 05/31/2021 11:28 AM MRN: [id number] Endoscopist: Gerrit Heck , MD Age: 80 Referring MD:  Date of Birth: 1941-06-23 Gender: Female Account #: [id number] Procedure:                Upper GI endoscopy Indications:              Microcytic anemia, Melena Medicines:                Monitored Anesthesia Care Procedure:                Pre-Anesthesia Assessment:                           - Prior to the procedure, a History and Physical                            was performed, and patient medications and                            allergies were reviewed. The patient's tolerance of                            previous anesthesia was also reviewed. The risks                            and benefits of the procedure and the sedation                            options and risks were discussed with the patient.                            All questions were answered, and informed consent                            was obtained. Prior Anticoagulants: The patient has                            taken no previous anticoagulant or antiplatelet                            agents except for aspirin. ASA Grade Assessment: II                            - A patient with mild systemic disease. After                            reviewing the risks and benefits, the patient was                            deemed in satisfactory condition to undergo the                            procedure.  After obtaining informed consent, the endoscope was                            passed under direct vision. Throughout the                            procedure, the patient's blood pressure, pulse, and                            oxygen saturations were monitored continuously. The                            Endoscope was introduced through the mouth, and                            advanced to the second part of duodenum. The upper                             GI endoscopy was accomplished without difficulty.                            The patient tolerated the procedure well. Scope In: Scope Out: Findings:                 Grade II varices were found in the lower third of                            the esophagus. They were 5 mm in largest diameter.                           Multiple 3 to 8 mm semi-sessile polyps were found                            in the gastric fundus, in the gastric body and in                            the gastric antrum. Several of these polyps had                            active oozing and additional contact oozing. These                            were not resected today in favor of resection and                            endoscopic treatment at the hospital where                            additional treatment modalities are available.                           Diffuse inflammation characterized by congestion                            (  edema) and erythema was found in the gastric                            fundus, in the gastric body and in the gastric                            antrum.                           A single small angioectasia without bleeding was                            found in the second portion of the duodenum. Will                            plan to treat with APC at the time of repeat EGD at                            the hospital. Complications:            No immediate complications. Estimated Blood Loss:     Estimated blood loss: none. Impression:               - Grade II esophageal varices.                           - Multiple gastric polyps.                           - Gastritis.                           - A single non-bleeding angioectasia in the                            duodenum.                           - No specimens collected. Recommendation:           - Patient has a contact number available for                            emergencies. The signs and symptoms  of potential                            delayed complications were discussed with the                            patient. Return to normal activities tomorrow.                            Written discharge instructions were provided to the                            patient.                           -  Resume previous diet today.                           - Continue present medications.                           - Repeat upper endoscopy at [facility] on                            06/05/2021 at 1:30. To arrive at 12:00.                           - Check PT/INR, Ferritin, iron panel, B12, folate                            today.                           - Check RUQ Ultrasound with Dopplers. Gerrit Heck, MD 05/31/2021 12:22:20 PM

## 2021-05-31 NOTE — H&P (View-Only) (Signed)
GASTROENTEROLOGY PROCEDURE H&P NOTE   Primary Care Physician: [name], NP    Reason for Procedure:   Iron deficiency anemia, melena, symptomatic anemia  Plan:    EGD  Patient is appropriate for endoscopic procedure(s) in the ambulatory ([place]) setting.  The nature of the procedure, as well as the risks, benefits, and alternatives were carefully and thoroughly reviewed with the patient. Ample time for discussion and questions allowed. The patient understood, was satisfied, and agreed to proceed.     HPI: [name] is a 80 y.o. female who presents for expedited EGD for evaluation and potential treatment of IDA and melena. She was seen in the GI Clinic by Ellouise Newer for this issue on 05/30/2021 with plan for expedited EGD for diagnostic and therapeutic intent. Holding ASA and increased PPI to Prilosec 40 mg BID yesterday. Please see yesterday's note for additional details. Otehrwise, no changes in clinical hx in the interim.   Past Medical History:  Diagnosis Date   Adenocarcinoma, breast ([place])    Anxiety    Colonic polyp    5 mm adenoma 1977   Degenerative joint disease    Fibromyalgia    GERD (gastroesophageal reflux disease)    Hemorrhoids    History of anemia    History of shingles    Hyperlipidemia    Hypertension    Hypothyroidism    IBS (irritable bowel syndrome)    NASH (nonalcoholic steatohepatitis)    Osteopenia    Personal history of chemotherapy 2005   Left Breast Cancer   Personal history of radiation therapy 2005   Left Breast Cancer   UTI (urinary tract infection) 03/01/2015    Past Surgical History:  Procedure Laterality Date   ABDOMINAL HYSTERECTOMY     APPENDECTOMY     BILATERAL SALPINGOOPHORECTOMY  1978   BREAST LUMPECTOMY Left 2005   left--node dissection 7/05 by Dr. Charlynne Pander   LAPAROSCOPIC CHOLECYSTECTOMY     ROTATOR CUFF REPAIR     left    Prior to Admission medications   Medication Sig Start Date End Date Taking?  Authorizing Provider  amoxicillin-clavulanate (AUGMENTIN) 875-125 MG tablet Take 1 tablet by mouth 2 (two) times daily for 10 days. 05/24/21 06/03/21 Yes Triplett, Cari B, FNP  escitalopram (LEXAPRO) 10 MG tablet Take 1 tablet (10 mg total) by mouth daily. For anxiety. 05/28/21  Yes [name], NP  Ferrous Sulfate (IRON PO) Take by mouth. One daily   Yes [provider]  fluticasone (FLONASE SENSIMIST) 27.5 MCG/SPRAY nasal spray Place 1 spray into the nose 2 (two) times daily. 05/28/21  Yes [name], NP  hydrOXYzine (ATARAX/VISTARIL) 10 MG tablet TAKE 1 TABLET BY MOUTH EVERY DAY AS NEEDED FOR ANXIETY 05/24/19  Yes [name], NP  levothyroxine (SYNTHROID) 75 MCG tablet TAKE 1 TABLET BY MOUTH EVERY DAY 11/20/20  Yes [name], NP  lisinopril (ZESTRIL) 20 MG tablet TAKE 1 TABLET (20 MG TOTAL) BY MOUTH DAILY. FOR BLOOD PRESSURE. 05/10/21  Yes [name], NP  omeprazole (PRILOSEC) 40 MG capsule Take 1 capsule (40 mg total) by mouth 2 (two) times daily before a meal. 05/30/21  Yes Levin Erp, PA    Current Outpatient Medications  Medication Sig Dispense Refill   amoxicillin-clavulanate (AUGMENTIN) 875-125 MG tablet Take 1 tablet by mouth 2 (two) times daily for 10 days. 20 tablet 0   escitalopram (LEXAPRO) 10 MG tablet Take 1 tablet (10 mg total) by mouth daily. For  anxiety. 90 tablet 3   Ferrous Sulfate (IRON PO) Take by mouth. One daily     fluticasone (FLONASE SENSIMIST) 27.5 MCG/SPRAY nasal spray Place 1 spray into the nose 2 (two) times daily. 10 g 0   hydrOXYzine (ATARAX/VISTARIL) 10 MG tablet TAKE 1 TABLET BY MOUTH EVERY DAY AS NEEDED FOR ANXIETY 90 tablet 0   levothyroxine (SYNTHROID) 75 MCG tablet TAKE 1 TABLET BY MOUTH EVERY DAY 90 tablet 1   lisinopril (ZESTRIL) 20 MG tablet TAKE 1 TABLET (20 MG TOTAL) BY MOUTH DAILY. FOR BLOOD PRESSURE. 90 tablet 1   omeprazole (PRILOSEC) 40 MG capsule Take 1 capsule (40 mg total) by mouth 2 (two) times  daily before a meal. 60 capsule 5   Current Facility-Administered Medications  Medication Dose Route Frequency Provider Last Rate Last Admin   0.9 %  sodium chloride infusion  500 mL Intravenous Once Karthik Whittinghill V, DO        Allergies as of 05/31/2021 - Review Complete 05/31/2021  Allergen Reaction Noted   Ciprofloxacin Diarrhea 07/03/2015   Codeine      Family History  Problem Relation Age of Onset   Stroke Mother    Prostate cancer Father    Emphysema Sister    Crohn's disease Sister    Prostate cancer Brother    Aneurysm Brother    Liver disease Brother    Hypertension Brother     Social History   Socioeconomic History   Marital status: Married    Spouse name: [name]   Number of children: 2   Years of education: Not on file   Highest education level: Not on file  Occupational History   Occupation: retired  Tobacco Use   Smoking status: Former    Packs/day: 0.20    Years: 7.00    Pack years: 1.40    Types: Cigarettes    Quit date: 09/29/1970    Years since quitting: 50.7   Smokeless tobacco: Never   Tobacco comments:    1 pack per week  Vaping Use   Vaping Use: Never used  Substance and Sexual Activity   Alcohol use: Not Currently   Drug use: No   Sexual activity: Yes  Other Topics Concern   Not on file  Social History Narrative   Would desire CPR   Social Determinants of Health   Financial Resource Strain: Not on file  Food Insecurity: No Food Insecurity   Worried About [place] in the Last Year: Never true   [place] in the Last Year: Never true  Transportation Needs: No Transportation Needs   Lack of Transportation (Medical): No   Lack of Transportation (Non-Medical): No  Physical Activity: Not on file  Stress: Not on file  Social Connections: Not on file  Intimate Partner Violence: Not on file    Physical Exam: Vital signs in last 24 hours: '@BP'$  (!) 168/73   Pulse 81   Temp 98 F (36.7 C) (Skin)   Resp 19   Ht  '5\' 5"'$  (1.651 m)   Wt 182 lb (82.6 kg)   SpO2 98%   BMI 30.29 kg/m  GEN: NAD EYE: Sclerae anicteric ENT: MMM CV: Non-tachycardic Pulm: CTA b/l GI: Soft, NT/ND NEURO:  Alert & Oriented x 3   Gerrit Heck, DO [facility]   05/31/2021 11:46 AM

## 2021-05-31 NOTE — Telephone Encounter (Signed)
-----   Message from [place], DO sent at 05/31/2021  1:38 PM EDT ----- A couple moving parts to help me out with on this patient if you can:  - EGD today with esophageal varices, oozing gastric polyps, and duodenal AVM, so she needs an EGD at [facility] to treat.  I was able to get her a spot on Wednesday, 06/05/2021 at [facility] at 1:30.  Can we please make sure orders are written for EGD with banding and APC? - I am scheduled to start in the [place] at 1:30 that day as well.  However, I have openings in my schedule.  Can we move the 1:30 patient to 2:30 and plan to start the afternoon at 2:30? -Can we make sure this patient gets an RUQ ultrasound with Dopplers? -She should have orders and to go to the lab today for PT/INR, iron panel, ferritin, B12, folate  Thank you!

## 2021-05-31 NOTE — Telephone Encounter (Signed)
Spoke with Elmyra Ricks at [facility], patient has been scheduled for an US liver doppler at [facility] on 06/12/21 at 8:30 am, arriving at 8:15 am.NPO after midnight. Enter alone and wear a mask, cancel at least 24 hours in advance to avoid $75 no show fee.

## 2021-05-31 NOTE — Progress Notes (Signed)
1139 Robinul 0.1 mg IV given due large amount of secretions upon assessment.  MD made aware, vss

## 2021-05-31 NOTE — Telephone Encounter (Signed)
Hospital orders have been entered. Pt is scheduled at [facility] on Wednesday, 06/05/21 at 1:30 pm, pt will need to arrive at 12 PM with a care partner. (CASE ID: [id number])  [place] schedule has been adjusted accordingly. Pt has gone for labs today.

## 2021-05-31 NOTE — Progress Notes (Signed)
Pt's states no medical or surgical changes since previsit or office visit. VS assessed by C.W 

## 2021-05-31 NOTE — Telephone Encounter (Signed)
Spoke with patient in regards to recommendations, appts and lab results. Pt verbalized understanding of all information.   Lab add on sheet faxed to lab to add TIBC.

## 2021-06-01 ENCOUNTER — Other Ambulatory Visit: Payer: Self-pay | Admitting: Primary Care

## 2021-06-01 DIAGNOSIS — E039 Hypothyroidism, unspecified: Secondary | ICD-10-CM

## 2021-06-04 ENCOUNTER — Telehealth: Payer: Self-pay

## 2021-06-04 ENCOUNTER — Other Ambulatory Visit: Payer: Self-pay

## 2021-06-04 ENCOUNTER — Encounter (HOSPITAL_COMMUNITY): Payer: Self-pay | Admitting: Gastroenterology

## 2021-06-04 NOTE — Telephone Encounter (Signed)
  Follow up Call-  Call back number 05/31/2021  Post procedure Call Back phone  # [phone]  Permission to leave phone message Yes  Some recent data might be hidden     Patient questions:  Do you have a fever, pain , or abdominal swelling? No. Pain Score  0 *  Have you tolerated food without any problems? Yes.    Have you been able to return to your normal activities? Yes.    Do you have any questions about your discharge instructions: Diet   No. Medications  No. Follow up visit  No.  Do you have questions or concerns about your Care? No.  Actions: * If pain score is 4 or above: No action needed, pain <4.

## 2021-06-04 NOTE — Telephone Encounter (Signed)
Left message on follow up call. 

## 2021-06-05 ENCOUNTER — Ambulatory Visit (HOSPITAL_COMMUNITY): Payer: Medicare HMO | Admitting: Certified Registered Nurse Anesthetist

## 2021-06-05 ENCOUNTER — Ambulatory Visit (HOSPITAL_COMMUNITY)
Admission: RE | Admit: 2021-06-05 | Discharge: 2021-06-05 | Disposition: A | Discharge status: Home / Self Care | Payer: Medicare HMO | Source: Ambulatory Visit | Attending: Gastroenterology | Admitting: Gastroenterology

## 2021-06-05 ENCOUNTER — Encounter (HOSPITAL_COMMUNITY)
Admission: RE | Disposition: A | Discharge status: Home / Self Care | Payer: Self-pay | Source: Ambulatory Visit | Attending: Gastroenterology

## 2021-06-05 ENCOUNTER — Encounter (HOSPITAL_COMMUNITY): Payer: Self-pay | Admitting: Gastroenterology

## 2021-06-05 DIAGNOSIS — K31811 Angiodysplasia of stomach and duodenum with bleeding: Secondary | ICD-10-CM | POA: Insufficient documentation

## 2021-06-05 DIAGNOSIS — I8510 Secondary esophageal varices without bleeding: Secondary | ICD-10-CM

## 2021-06-05 DIAGNOSIS — K3189 Other diseases of stomach and duodenum: Secondary | ICD-10-CM | POA: Diagnosis not present

## 2021-06-05 DIAGNOSIS — K317 Polyp of stomach and duodenum: Secondary | ICD-10-CM | POA: Diagnosis not present

## 2021-06-05 DIAGNOSIS — K921 Melena: Secondary | ICD-10-CM

## 2021-06-05 DIAGNOSIS — E039 Hypothyroidism, unspecified: Secondary | ICD-10-CM | POA: Insufficient documentation

## 2021-06-05 DIAGNOSIS — Z853 Personal history of malignant neoplasm of breast: Secondary | ICD-10-CM | POA: Diagnosis not present

## 2021-06-05 DIAGNOSIS — Z90722 Acquired absence of ovaries, bilateral: Secondary | ICD-10-CM | POA: Insufficient documentation

## 2021-06-05 DIAGNOSIS — Z87891 Personal history of nicotine dependence: Secondary | ICD-10-CM | POA: Diagnosis not present

## 2021-06-05 DIAGNOSIS — Z79890 Hormone replacement therapy (postmenopausal): Secondary | ICD-10-CM | POA: Diagnosis not present

## 2021-06-05 DIAGNOSIS — Z8619 Personal history of other infectious and parasitic diseases: Secondary | ICD-10-CM | POA: Diagnosis not present

## 2021-06-05 DIAGNOSIS — E785 Hyperlipidemia, unspecified: Secondary | ICD-10-CM | POA: Diagnosis not present

## 2021-06-05 DIAGNOSIS — K589 Irritable bowel syndrome without diarrhea: Secondary | ICD-10-CM | POA: Diagnosis not present

## 2021-06-05 DIAGNOSIS — K766 Portal hypertension: Secondary | ICD-10-CM | POA: Diagnosis not present

## 2021-06-05 DIAGNOSIS — Z79899 Other long term (current) drug therapy: Secondary | ICD-10-CM | POA: Insufficient documentation

## 2021-06-05 DIAGNOSIS — D509 Iron deficiency anemia, unspecified: Secondary | ICD-10-CM | POA: Diagnosis not present

## 2021-06-05 DIAGNOSIS — I8500 Esophageal varices without bleeding: Secondary | ICD-10-CM | POA: Diagnosis not present

## 2021-06-05 DIAGNOSIS — K219 Gastro-esophageal reflux disease without esophagitis: Secondary | ICD-10-CM | POA: Diagnosis not present

## 2021-06-05 DIAGNOSIS — I8511 Secondary esophageal varices with bleeding: Secondary | ICD-10-CM | POA: Diagnosis not present

## 2021-06-05 DIAGNOSIS — I10 Essential (primary) hypertension: Secondary | ICD-10-CM | POA: Diagnosis not present

## 2021-06-05 DIAGNOSIS — Z885 Allergy status to narcotic agent status: Secondary | ICD-10-CM | POA: Insufficient documentation

## 2021-06-05 DIAGNOSIS — K7581 Nonalcoholic steatohepatitis (NASH): Secondary | ICD-10-CM | POA: Diagnosis not present

## 2021-06-05 DIAGNOSIS — Z90710 Acquired absence of both cervix and uterus: Secondary | ICD-10-CM | POA: Insufficient documentation

## 2021-06-05 DIAGNOSIS — Z9049 Acquired absence of other specified parts of digestive tract: Secondary | ICD-10-CM | POA: Diagnosis not present

## 2021-06-05 DIAGNOSIS — K259 Gastric ulcer, unspecified as acute or chronic, without hemorrhage or perforation: Secondary | ICD-10-CM | POA: Diagnosis not present

## 2021-06-05 DIAGNOSIS — Z881 Allergy status to other antibiotic agents status: Secondary | ICD-10-CM | POA: Diagnosis not present

## 2021-06-05 DIAGNOSIS — Z8249 Family history of ischemic heart disease and other diseases of the circulatory system: Secondary | ICD-10-CM | POA: Insufficient documentation

## 2021-06-05 DIAGNOSIS — Z8379 Family history of other diseases of the digestive system: Secondary | ICD-10-CM | POA: Diagnosis not present

## 2021-06-05 DIAGNOSIS — K31819 Angiodysplasia of stomach and duodenum without bleeding: Secondary | ICD-10-CM | POA: Diagnosis not present

## 2021-06-05 HISTORY — DX: Presence of spectacles and contact lenses: Z97.3

## 2021-06-05 HISTORY — PX: ESOPHAGOGASTRODUODENOSCOPY (EGD) WITH PROPOFOL: SHX5813

## 2021-06-05 HISTORY — PX: HEMOSTASIS CLIP PLACEMENT: SHX6857

## 2021-06-05 HISTORY — PX: HOT HEMOSTASIS: SHX5433

## 2021-06-05 HISTORY — PX: POLYPECTOMY: SHX5525

## 2021-06-05 SURGERY — ESOPHAGOGASTRODUODENOSCOPY (EGD) WITH PROPOFOL
Anesthesia: Monitor Anesthesia Care

## 2021-06-05 MED ORDER — NADOLOL 20 MG PO TABS: 20.0000 mg | ORAL_TABLET | Freq: Every day | ORAL | 3 refills | Status: DC

## 2021-06-05 MED ORDER — PROPOFOL 1000 MG/100ML IV EMUL
INTRAVENOUS | Status: AC
  Filled 2021-06-05: qty 100

## 2021-06-05 MED ORDER — LACTATED RINGERS IV SOLN
INTRAVENOUS | Status: AC | PRN
  Administered 2021-06-05: 10 mL/h via INTRAVENOUS

## 2021-06-05 MED ORDER — PROPOFOL 500 MG/50ML IV EMUL
INTRAVENOUS | Status: DC | PRN
  Administered 2021-06-05: 100 ug/kg/min via INTRAVENOUS

## 2021-06-05 MED ORDER — PROPOFOL 10 MG/ML IV BOLUS
INTRAVENOUS | Status: DC | PRN
  Administered 2021-06-05: 90 mg via INTRAVENOUS
  Administered 2021-06-05: 20 mg via INTRAVENOUS
  Administered 2021-06-05: 10 mg via INTRAVENOUS
  Administered 2021-06-05: 30 mg via INTRAVENOUS

## 2021-06-05 MED ORDER — LIDOCAINE 2% (20 MG/ML) 5 ML SYRINGE
INTRAMUSCULAR | Status: DC | PRN
  Administered 2021-06-05: 80 mg via INTRAVENOUS
  Administered 2021-06-05: 20 mg via INTRAVENOUS

## 2021-06-05 MED ORDER — DEXAMETHASONE SODIUM PHOSPHATE 10 MG/ML IJ SOLN
INTRAMUSCULAR | Status: DC | PRN
  Administered 2021-06-05: 5 mg via INTRAVENOUS

## 2021-06-05 MED ORDER — PROPOFOL 500 MG/50ML IV EMUL
INTRAVENOUS | Status: AC
  Filled 2021-06-05: qty 100

## 2021-06-05 MED ORDER — PHENYLEPHRINE 40 MCG/ML (10ML) SYRINGE FOR IV PUSH (FOR BLOOD PRESSURE SUPPORT)
PREFILLED_SYRINGE | INTRAVENOUS | Status: DC | PRN
  Administered 2021-06-05: 80 ug via INTRAVENOUS

## 2021-06-05 MED ORDER — SUCCINYLCHOLINE CHLORIDE 200 MG/10ML IV SOSY
PREFILLED_SYRINGE | INTRAVENOUS | Status: DC | PRN
  Administered 2021-06-05: 120 mg via INTRAVENOUS

## 2021-06-05 MED ORDER — SODIUM CHLORIDE 0.9 % IV SOLN: INTRAVENOUS | Status: DC

## 2021-06-05 MED ORDER — ONDANSETRON HCL 4 MG/2ML IJ SOLN
INTRAMUSCULAR | Status: DC | PRN
  Administered 2021-06-05: 4 mg via INTRAVENOUS

## 2021-06-05 SURGICAL SUPPLY — 15 items

## 2021-06-05 NOTE — Anesthesia Procedure Notes (Signed)
Procedure Name: MAC Date/Time: 06/05/2021 1:26 PM Performed by: West Pugh, CRNA Pre-anesthesia Checklist: Patient identified, Emergency Drugs available, Suction available, Patient being monitored and Timeout performed Patient Re-evaluated:Patient Re-evaluated prior to induction Oxygen Delivery Method: Simple face mask Preoxygenation: Pre-oxygenation with 100% oxygen Placement Confirmation: positive ETCO2 Dental Injury: Teeth and Oropharynx as per pre-operative assessment

## 2021-06-05 NOTE — Op Note (Signed)
[facility] Patient Name: [name] Procedure Date: 06/05/2021 MRN: [id number] Attending MD: Gerrit Heck , MD Date of Birth: 10-Oct-1940 CSN: [id number] Age: 80 Admit Type: Outpatient Procedure:                Upper GI endoscopy Indications:              Iron deficiency anemia, Melena Providers:                Gerrit Heck, MD, Burtis Junes, RN, Benetta Spar, Technician Referring MD:              Medicines:                Monitored Anesthesia Care Complications:            No immediate complications. Estimated Blood Loss:     Estimated blood loss was minimal. Procedure:                Pre-Anesthesia Assessment:                           - Prior to the procedure, a History and Physical                            was performed, and patient medications and                            allergies were reviewed. The patient's tolerance of                            previous anesthesia was also reviewed. The risks                            and benefits of the procedure and the sedation                            options and risks were discussed with the patient.                            All questions were answered, and informed consent                            was obtained. Prior Anticoagulants: The patient has                            taken no previous anticoagulant or antiplatelet                            agents. ASA Grade Assessment: III - A patient with                            severe systemic disease. After reviewing the risks  and benefits, the patient was deemed in                            satisfactory condition to undergo the procedure.                           After obtaining informed consent, the endoscope was                            passed under direct vision. Throughout the                            procedure, the patient's blood pressure, pulse, and                            oxygen  saturations were monitored continuously. The                            [id number] Olympus endoscope was introduced                            through the mouth, and advanced to the third part                            of duodenum. The patient did develop brief                            laryngospasm and cough, but maintained oxygen                            saturation in the mid to upper 90's and otherwise                            normal HR and BP. Given the likelihood for a                            prolonged procedure time, the endoscope was removed                            in favor of elective intubation and deeper                            sedation. The endoscope was then reintroduced and                            the remainder of the procedure was accomplished                            without difficulty. The patient tolerated the                            procedure fairly well. Scope In: Scope Out: Findings:      Grade I varices were found in the lower third  of the esophagus. They       were 5 mm in largest diameter. These flattened with air insufflation. No       high grade stigmata was noted.      The upper third of the esophagus and middle third of the esophagus were       normal.      Four sessile polyps with active oozing were found in the gastric fundus       and in the gastric body. The largest was 8 mm in size. These polyps were       removed with a hot snare. Resection and retrieval were complete. There       was mild, persistent oozing at 3 of the polypectomy sites. To stop the       bleeding and prevent future bleeding after the polypectomy, five       hemostatic clips were successfully placed (MR conditional). There was no       bleeding at the end of the procedure.      Mild portal hypertensive gastropathy was found in the gastric fundus and       in the gastric body. This was not additionally biopsied due to the       bleeding at each of the  polypectomy sites.      A single area of polypoid mucosa was noted in the prepyloric region of       the stomach. This appeared to have a nipple-like tip with active oozing.       For hemostasis, one hemostatic clip was successfully placed (MR       conditional). There was no bleeding at the end of the procedure.      A single small angioectasia without bleeding was found in the second       portion of the duodenum. Coagulation for hemostasis using argon plasma       was successful. Estimated blood loss was minimal. Impression:               - Grade I esophageal varices.                           - Normal upper third of esophagus and middle third                            of esophagus.                           - Four gastric polyps. Resected and retrieved.                            Clips (MR conditional) were placed.                           - Portal hypertensive gastropathy.                           - A polypoid area with a nipple-like tip was noted                            in the pre-pyloric antrum. Clip (MR conditional)  was placed with cessation of bleeding.                           - A single non-bleeding angioectasia in the                            duodenum. Treated with argon plasma coagulation                            (APC). Moderate Sedation:      Not Applicable - Patient had care per Anesthesia. Recommendation:           - Patient has a contact number available for                            emergencies. The signs and symptoms of potential                            delayed complications were discussed with the                            patient. Return to normal activities tomorrow.                            Written discharge instructions were provided to the                            patient.                           - Resume previous diet today.                           - Continue present medications.                           -  Await pathology results.                           - Repeat upper endoscopy PRN for retreatment.                           - Return to GI clinic at appointment to be                            scheduled.                           - Continue iron therapy.                           - Start nadolol 20 mg PO daily.                           - Continue Prilosec 40 mg PO BID.                           -  Will follow-up on RUQ ultrasound scheduled for                            next week.                           - Results discussed with patient at bedside and                            with daughter by phone. Procedure Code(s):        --- Professional ---                           [phone], 56, Esophagogastroduodenoscopy, flexible,                            transoral; with control of bleeding, any method                           43251, Esophagogastroduodenoscopy, flexible,                            transoral; with removal of tumor(s), polyp(s), or                            other lesion(s) by snare technique Diagnosis Code(s):        --- Professional ---                           I85.00, Esophageal varices without bleeding                           K31.7, Polyp of stomach and duodenum                           K76.6, Portal hypertension                           K31.89, Other diseases of stomach and duodenum                           K31.819, Angiodysplasia of stomach and duodenum                            without bleeding                           D50.9, Iron deficiency anemia, unspecified                           K92.1, Melena (includes Hematochezia) CPT copyright 2019 [employer]. All rights reserved. The codes documented in this report are preliminary and upon coder review may  be revised to meet current compliance requirements. Gerrit Heck, MD 06/05/2021 2:34:08 PM Number of Addenda: 0

## 2021-06-05 NOTE — Anesthesia Postprocedure Evaluation (Signed)
Anesthesia Post Note  Patient: [name]  Procedure(s) Performed: ESOPHAGOGASTRODUODENOSCOPY (EGD) WITH PROPOFOL ESOPHAGEAL BANDING HOT HEMOSTASIS (ARGON PLASMA COAGULATION/BICAP) HEMOSTASIS CLIP PLACEMENT POLYPECTOMY     Patient location during evaluation: PACU Anesthesia Type: General Level of consciousness: awake and alert Pain management: pain level controlled Vital Signs Assessment: post-procedure vital signs reviewed and stable Respiratory status: spontaneous breathing, nonlabored ventilation and respiratory function stable Cardiovascular status: blood pressure returned to baseline and stable Postop Assessment: no apparent nausea or vomiting Anesthetic complications: no   No notable events documented.  Last Vitals:  Vitals:   06/05/21 1430 06/05/21 1440  BP: 112/63 (!) 120/55  Pulse: 68 68  Resp: (!) 21 (!) 26  Temp:    SpO2: 100% 94%    Last Pain:  Vitals:   06/05/21 1440  TempSrc:   PainSc: 0-No pain                 Audry Pili

## 2021-06-05 NOTE — Anesthesia Procedure Notes (Signed)
Procedure Name: Intubation Date/Time: 06/05/2021 1:54 PM Performed by: West Pugh, CRNA Pre-anesthesia Checklist: Patient identified, Emergency Drugs available, Suction available, Patient being monitored and Timeout performed Patient Re-evaluated:Patient Re-evaluated prior to induction Oxygen Delivery Method: Circle system utilized Preoxygenation: Pre-oxygenation with 100% oxygen Induction Type: IV induction Ventilation: Mask ventilation without difficulty Laryngoscope Size: Mac and 3 Grade View: Grade III Tube type: Oral Tube size: 7.0 mm Number of attempts: 2 Airway Equipment and Method: Stylet Placement Confirmation: ETT inserted through vocal cords under direct vision, positive ETCO2, CO2 detector and breath sounds checked- equal and bilateral Secured at: 21 cm Tube secured with: Tape Dental Injury: Teeth and Oropharynx as per pre-operative assessment

## 2021-06-05 NOTE — Transfer of Care (Signed)
Immediate Anesthesia Transfer of Care Note  Patient: [name]  Procedure(s) Performed: ESOPHAGOGASTRODUODENOSCOPY (EGD) WITH PROPOFOL ESOPHAGEAL BANDING HOT HEMOSTASIS (ARGON PLASMA COAGULATION/BICAP) HEMOSTASIS CLIP PLACEMENT POLYPECTOMY  Patient Location: PACU and Endoscopy Unit  Anesthesia Type:General  Level of Consciousness: awake, drowsy and patient cooperative  Airway & Oxygen Therapy: Patient Spontanous Breathing and Patient connected to face mask oxygen  Post-op Assessment: Report given to RN and Post -op Vital signs reviewed and stable  Post vital signs: Reviewed and stable  Last Vitals:  Vitals Value Taken Time  BP 112/63 06/05/21 1430  Temp 36.5 C 06/05/21 1428  Pulse 66 06/05/21 1432  Resp 20 06/05/21 1432  SpO2 100 % 06/05/21 1432  Vitals shown include unvalidated device data.  Last Pain:  Vitals:   06/05/21 1428  TempSrc: Axillary         Complications: No notable events documented.

## 2021-06-05 NOTE — Interval H&P Note (Signed)
History and Physical Interval Note:  EGD completed on 05/31/2021 and notable for multiple gastric polyps, a few with active mucosal bruising.  Additionally, 1 duodenal AVM, and esophageal varices were noted.  Based on comorbidities and overall endoscopic appearance, endoscopic intervention was deferred to be done at [facility] due to need for/availability of more advanced therapeutics.  Otherwise, no changes in clinical history in the interim.  She is scheduled for RUQ [place] with Doppler on 06/12/2021 to evaluate the new finding of esophageal varices and suspected portal hypertensive gastropathy.  INR slightly elevated at 1.4.  PLT normal.  Albumin 3.2, with otherwise normal AST/ALT and T bili (0.6).  Creatinine normal at 0.9.  If cirrhosis confirmed, would have MELD 10 and Child Pugh A (or low B if ultrasound with ascites).  06/05/2021 12:18 PM  [name]  has presented today for surgery, with the diagnosis of esophageal varices, oozing gastric polyps, duodenal AVM.  The various methods of treatment have been discussed with the patient and family. After consideration of risks, benefits and other options for treatment, the patient has consented to  Procedure(s): ESOPHAGOGASTRODUODENOSCOPY (EGD) WITH PROPOFOL (N/A) ESOPHAGEAL BANDING (N/A) HOT HEMOSTASIS (ARGON PLASMA COAGULATION/BICAP) (N/A) as a surgical intervention.  The patient's history has been reviewed, patient examined, no change in status, stable for surgery.  I have reviewed the patient's chart and labs.  Questions were answered to the patient's satisfaction.     Dominic Pea Caytlin Better

## 2021-06-05 NOTE — Anesthesia Preprocedure Evaluation (Addendum)
Anesthesia Evaluation  Patient identified by MRN, date of birth, ID band Patient awake    Reviewed: Allergy & Precautions, NPO status , Patient's Chart, lab work & pertinent test results  History of Anesthesia Complications Negative for: history of anesthetic complications  Airway Mallampati: II  TM Distance: >3 FB Neck ROM: Full    Dental  (+) Dental Advisory Given   Pulmonary former smoker,    Pulmonary exam normal        Cardiovascular hypertension, Pt. on medications Normal cardiovascular exam     Neuro/Psych PSYCHIATRIC DISORDERS Anxiety negative neurological ROS     GI/Hepatic GERD  Medicated and Controlled,(+) Hepatitis - ([facility]) IBS    Endo/Other  Hypothyroidism   Renal/GU negative Renal ROS     Musculoskeletal  (+) Arthritis , Fibromyalgia -  Abdominal   Peds  Hematology  (+) anemia ,   Anesthesia Other Findings   Reproductive/Obstetrics  Breast cancer                             Anesthesia Physical Anesthesia Plan  ASA: 3  Anesthesia Plan: MAC   Post-op Pain Management:    Induction:   PONV Risk Score and Plan: 2 and Propofol infusion and Treatment may vary due to age or medical condition  Airway Management Planned: Nasal Cannula and Natural Airway  Additional Equipment: None  Intra-op Plan:   Post-operative Plan:   Informed Consent: I have reviewed the patients History and Physical, chart, labs and discussed the procedure including the risks, benefits and alternatives for the proposed anesthesia with the patient or authorized representative who has indicated his/her understanding and acceptance.       Plan Discussed with: CRNA and Anesthesiologist  Anesthesia Plan Comments:        Anesthesia Quick Evaluation

## 2021-06-05 NOTE — Discharge Instructions (Signed)
YOU HAD AN ENDOSCOPIC PROCEDURE TODAY: Refer to the procedure report and other information in the discharge instructions given to you for any specific questions about what was found during the examination. If this information does not answer your questions, please call [place] office at [phone] to clarify.   YOU SHOULD EXPECT: Some feelings of bloating in the abdomen. Passage of more gas than usual. Walking can help get rid of the air that was put into your GI tract during the procedure and reduce the bloating. If you had a lower endoscopy (such as a colonoscopy or flexible sigmoidoscopy) you may notice spotting of blood in your stool or on the toilet paper. Some abdominal soreness may be present for a day or two, also.  DIET: Your first meal following the procedure should be a light meal and then it is ok to progress to your normal diet. A half-sandwich or bowl of soup is an example of a good first meal. Heavy or fried foods are harder to digest and may make you feel nauseous or bloated. Drink plenty of fluids but you should avoid alcoholic beverages for 24 hours. If you had a esophageal dilation, please see attached instructions for diet.    ACTIVITY: Your care partner should take you home directly after the procedure. You should plan to take it easy, moving slowly for the rest of the day. You can resume normal activity the day after the procedure however YOU SHOULD NOT DRIVE, use power tools, machinery or perform tasks that involve climbing or major physical exertion for 24 hours (because of the sedation medicines used during the test).   SYMPTOMS TO REPORT IMMEDIATELY: A gastroenterologist can be reached at any hour. Please call [phone]  for any of the following symptoms:   Following upper endoscopy (EGD, EUS, ERCP, esophageal dilation) Vomiting of blood or coffee ground material  New, significant abdominal pain  New, significant chest pain or pain under the shoulder blades  Painful or  persistently difficult swallowing  New shortness of breath  Black, tarry-looking or red, bloody stools  FOLLOW UP:  If any biopsies were taken you will be contacted by phone or by letter within the next 1-3 weeks. Call [phone]  if you have not heard about the biopsies in 3 weeks.  Please also call with any specific questions about appointments or follow up tests.  

## 2021-06-06 ENCOUNTER — Encounter (HOSPITAL_COMMUNITY): Payer: Self-pay | Admitting: Gastroenterology

## 2021-06-06 ENCOUNTER — Telehealth: Payer: Self-pay | Admitting: Gastroenterology

## 2021-06-06 LAB — SURGICAL PATHOLOGY

## 2021-06-06 MED ORDER — NADOLOL 20 MG PO TABS: 20.0000 mg | ORAL_TABLET | Freq: Every day | ORAL | 3 refills | Status: DC

## 2021-06-06 NOTE — Telephone Encounter (Signed)
Spoke with the patient's daughter that Medication has been sent to the [facility] pharmacy. She verbalized understanding.

## 2021-06-06 NOTE — Telephone Encounter (Signed)
Done and LVM for them to make them aware

## 2021-06-12 ENCOUNTER — Ambulatory Visit
Admission: RE | Admit: 2021-06-12 | Discharge: 2021-06-12 | Disposition: A | Discharge status: Home / Self Care | Payer: Medicare HMO | Source: Ambulatory Visit | Attending: Gastroenterology | Admitting: Gastroenterology

## 2021-06-12 ENCOUNTER — Other Ambulatory Visit: Payer: Medicare HMO

## 2021-06-12 DIAGNOSIS — K921 Melena: Secondary | ICD-10-CM

## 2021-06-12 DIAGNOSIS — K219 Gastro-esophageal reflux disease without esophagitis: Secondary | ICD-10-CM

## 2021-06-12 DIAGNOSIS — K317 Polyp of stomach and duodenum: Secondary | ICD-10-CM

## 2021-06-12 DIAGNOSIS — I81 Portal vein thrombosis: Secondary | ICD-10-CM | POA: Diagnosis not present

## 2021-06-12 DIAGNOSIS — K766 Portal hypertension: Secondary | ICD-10-CM | POA: Diagnosis not present

## 2021-06-12 DIAGNOSIS — I8500 Esophageal varices without bleeding: Secondary | ICD-10-CM

## 2021-06-12 DIAGNOSIS — K7460 Unspecified cirrhosis of liver: Secondary | ICD-10-CM | POA: Diagnosis not present

## 2021-06-12 DIAGNOSIS — K7689 Other specified diseases of liver: Secondary | ICD-10-CM | POA: Diagnosis not present

## 2021-06-12 DIAGNOSIS — D509 Iron deficiency anemia, unspecified: Secondary | ICD-10-CM

## 2021-06-17 ENCOUNTER — Telehealth: Payer: Self-pay | Admitting: General Surgery

## 2021-06-17 DIAGNOSIS — K838 Other specified diseases of biliary tract: Secondary | ICD-10-CM

## 2021-06-17 DIAGNOSIS — K760 Fatty (change of) liver, not elsewhere classified: Secondary | ICD-10-CM

## 2021-06-17 DIAGNOSIS — K766 Portal hypertension: Secondary | ICD-10-CM

## 2021-06-17 DIAGNOSIS — K7460 Unspecified cirrhosis of liver: Secondary | ICD-10-CM

## 2021-06-17 NOTE — Telephone Encounter (Signed)
-----   Message from Mahomet, DO sent at 06/17/2021 12:51 PM EDT ----- Ultrasound with Doppler completed.  Ultrasound demonstrates nodular appearing liver with perihepatic ascites and mild splenomegaly, all consistent with cirrhosis with portal hypertension.  Portal and hepatic veins patent and with appropriate flow.  No liver mass.  CBD dilated up to 1.7 cm with possible 1.4 cm stone in the extrahepatic bile duct.  Plan for the following:  - MRCP - Schedule follow-up in the GI clinic to discuss ongoing work-up of newly diagnosed cirrhosis

## 2021-06-17 NOTE — Telephone Encounter (Signed)
Sent message to central scheduling and [name] will call to schedule MRCP

## 2021-06-17 NOTE — Telephone Encounter (Signed)
Created order for MRCP at [facility]- will send to schedulers for appt

## 2021-06-17 NOTE — Telephone Encounter (Signed)
Ultrasound with Doppler completed.  Ultrasound demonstrates nodular appearing liver with perihepatic ascites and mild splenomegaly, all consistent with cirrhosis with portal hypertension.  Portal and hepatic veins patent and with appropriate flow.  No liver mass.  CBD dilated up to 1.7 cm with possible 1.4 cm stone in the extrahepatic bile duct.  Plan for the following:  - MRCP - Schedule follow-up in the GI clinic to discuss ongoing work-up of newly diagnosed cirrhosis

## 2021-06-17 NOTE — Telephone Encounter (Signed)
Contacted the patient on her mobile and home number, LVM for her to contact me back regarding results.

## 2021-06-17 NOTE — Telephone Encounter (Signed)
Contacted the patients daughter Arrie Aran per her mychart request. Went over Dr Vivia Ewing notes regarding her mothers pathology from the [place] and advised her that I placed an order for an MRCP and she will have be called by [place] to schedule. Dawn verbalized understanding.

## 2021-06-18 ENCOUNTER — Telehealth: Payer: Self-pay | Admitting: Primary Care

## 2021-06-18 NOTE — Telephone Encounter (Signed)
LVM for pt to rtn my call to schedule awv with nha. 

## 2021-06-25 ENCOUNTER — Telehealth: Payer: Medicare HMO

## 2021-06-27 ENCOUNTER — Other Ambulatory Visit: Payer: Self-pay | Admitting: Gastroenterology

## 2021-06-27 ENCOUNTER — Ambulatory Visit (HOSPITAL_COMMUNITY)
Admission: RE | Admit: 2021-06-27 | Discharge: 2021-06-27 | Disposition: A | Discharge status: Home / Self Care | Payer: Medicare HMO | Source: Ambulatory Visit | Attending: Gastroenterology | Admitting: Gastroenterology

## 2021-06-27 DIAGNOSIS — K760 Fatty (change of) liver, not elsewhere classified: Secondary | ICD-10-CM

## 2021-06-27 DIAGNOSIS — K838 Other specified diseases of biliary tract: Secondary | ICD-10-CM | POA: Insufficient documentation

## 2021-06-27 DIAGNOSIS — K7469 Other cirrhosis of liver: Secondary | ICD-10-CM | POA: Diagnosis not present

## 2021-06-27 DIAGNOSIS — K766 Portal hypertension: Secondary | ICD-10-CM | POA: Insufficient documentation

## 2021-06-27 DIAGNOSIS — K7460 Unspecified cirrhosis of liver: Secondary | ICD-10-CM | POA: Insufficient documentation

## 2021-06-27 DIAGNOSIS — K7689 Other specified diseases of liver: Secondary | ICD-10-CM | POA: Diagnosis not present

## 2021-06-27 DIAGNOSIS — K449 Diaphragmatic hernia without obstruction or gangrene: Secondary | ICD-10-CM | POA: Diagnosis not present

## 2021-06-27 MED ORDER — GADOBUTROL 1 MMOL/ML IV SOLN
8.0000 mL | Freq: Once | INTRAVENOUS | Status: AC | PRN
  Administered 2021-06-27: 8 mL via INTRAVENOUS

## 2021-07-01 ENCOUNTER — Telehealth: Payer: Self-pay | Admitting: General Surgery

## 2021-07-01 NOTE — Telephone Encounter (Signed)
-----   Message from Carlos, DO sent at 07/01/2021 12:47 PM EDT ----- MRI/MRCP results reviewed and notable for the following: -Signs of hepatic cirrhosis to include splenomegaly, small volume ascites. - No biliary duct dilation or filling defects of the CBD (i.e. no choledocholithiasis). - Cystic area of the porta habitus, with appearance suggestive of chronic postoperative process such as small seroma or biloma -6 mm subtle focus in the right liver -Small nonenhancing cystic area in the head of the pancreas measuring 1.4 x 0.6 cm and another measuring 8 mm.  No associated pancreatic duct dilation.  Recommend the following: -Repeat CT three-phase liver in 3-6 months per Radiologist recommendations -Follow-up in the GI clinic as scheduled.  Will plan to discuss these results in detail along with whether or not further evaluation of the pancreas with EUS is warranted vs adding pancreatic protocol to the above CT

## 2021-07-01 NOTE — Telephone Encounter (Signed)
Unable to reach the patient to discuss results to her MRCP. Patient is scheduled for a follow up on 07/02/2021, we will discuss it then.

## 2021-07-02 ENCOUNTER — Other Ambulatory Visit (INDEPENDENT_AMBULATORY_CARE_PROVIDER_SITE_OTHER): Payer: Medicare HMO

## 2021-07-02 ENCOUNTER — Ambulatory Visit (INDEPENDENT_AMBULATORY_CARE_PROVIDER_SITE_OTHER): Payer: Medicare HMO | Admitting: Gastroenterology

## 2021-07-02 ENCOUNTER — Encounter: Payer: Self-pay | Admitting: Gastroenterology

## 2021-07-02 ENCOUNTER — Other Ambulatory Visit: Payer: Self-pay

## 2021-07-02 VITALS — BP 146/80 | HR 95 | Ht 65.0 in | Wt 182.0 lb

## 2021-07-02 DIAGNOSIS — K3189 Other diseases of stomach and duodenum: Secondary | ICD-10-CM

## 2021-07-02 DIAGNOSIS — K219 Gastro-esophageal reflux disease without esophagitis: Secondary | ICD-10-CM | POA: Diagnosis not present

## 2021-07-02 DIAGNOSIS — R161 Splenomegaly, not elsewhere classified: Secondary | ICD-10-CM | POA: Diagnosis not present

## 2021-07-02 DIAGNOSIS — K862 Cyst of pancreas: Secondary | ICD-10-CM

## 2021-07-02 DIAGNOSIS — D500 Iron deficiency anemia secondary to blood loss (chronic): Secondary | ICD-10-CM

## 2021-07-02 DIAGNOSIS — K317 Polyp of stomach and duodenum: Secondary | ICD-10-CM

## 2021-07-02 DIAGNOSIS — K7469 Other cirrhosis of liver: Secondary | ICD-10-CM

## 2021-07-02 DIAGNOSIS — K766 Portal hypertension: Secondary | ICD-10-CM

## 2021-07-02 DIAGNOSIS — I8510 Secondary esophageal varices without bleeding: Secondary | ICD-10-CM

## 2021-07-02 NOTE — Progress Notes (Signed)
Chief Complaint:    Procedure follow-up, review recent imaging studies, newly diagnosed cirrhosis  GI History: 80 year old female with a history of fibromyalgia, breast cancer, HTN, HLD, hypothyroidism, IBS, NASH, GERD, referred to the GI clinic in 05/2021 for evaluation of anemia. - 04/2021: Was admitted to [facility] following a fall and noted to have Hgb 8.9 (normal 1 year ago) with MCV 77.8.  FOBT positive.  Patient did report 2-33-month history of dark stools.  Continued omeprazole 40 mg/day (history of well-controlled GERD) and ASA 81 mg stopped.  Started on oral iron - 05/30/2021: GI evaluation.  Increased omeprazole to 40 mg bid and scheduled for expedited EGD.  Repeat H/H 9.2/28.9 with MCV/RDW 75.8/17.5.  Ferritin 16.8, iron 8, TIBC 392, sat 2.3%, B12 748, folate >24.  INR 1.4.  Albumin 3.2 otherwise normal liver enzymes and T bili. - 05/31/2021: EGD: Small esophageal varices, multiple gastric polyps with active oozing, single nonbleeding AVM in D2 - 06/05/2021: EGD at [facility]: Grade 1 varices flattened with insufflation, for active oozing polyps in the gastric fundus/body removed with hot snare then clipped closed, mild portal hypertensive gastropathy, small polypoid area with nipple like tip with active oozing in prepyloric antrum clipped closed, single AVM in D2 treated with APC.  Started on nadolol and continued high-dose PPI - 06/12/2021: RUQ [place] with Doppler: Nodular appearing liver with perihepatic ascites and mild splenomegaly c/w cirrhosis with portal hypertension.  Patent veins.  CBD 1.7 cm with possible 1.4 cm stone in extrahepatic bile duct - 06/27/2021: MRCP: Hepatic cirrhosis characterized by splenomegaly and small volume ascites.  No biliary duct dilatation or filling defects, cystic area in porta hepatus suggestive of chronic postoperative inflammatory process (seroma or biloma), 6 mm subtle focus in the right liver.  Small nonenhancing cystic area in the head of pancreas measuring 1.4 x 0.6 cm and  another measuring 8 mm without PD dilation.  Recommended repeat CT three-phase in 3-6 months   Cirrhosis Evaluation: - Etiology: NASH - Complications: Esophageal varices, splenomegaly, portal hypertensive gastropathy, small volume ascites - HCC screening: UTD - Variceal screening: EGD 05/2021.  Consider repeat in 2024 - Serologic evaluation: Declined extended serologic evaluation based on history - Viral hepatitis vaccination: Checking labs today - Flu vaccine: UTD - Liver biopsy: 2008: Steatohepatitis - Medications: Nadolol - MELD: 10 - Child Pugh score: B (7 points)   Endoscopic History: - 11/30/2003 EGD with a hiatal hernia and a mildly tortuous esophagus.  Otherwise normal. - 12/06/2009 colonoscopy normal, had a history of a diminutive adenoma removed in 1997, follow-up recommended 10 years  HPI:     Patient is a 80 y.o. female presenting to the Gastroenterology Clinic for follow-up.  She presents with 2 of her family members today.  She is otherwise without any complaints and wishes to review recent MRI/MRCP and EGD findings as outlined above.  Otherwise, no recent dark stools.  Good appetite.  Taking all medications as prescribed.   Review of systems:     No chest pain, no SOB, no fevers, no urinary sx   Past Medical History:  Diagnosis Date   Adenocarcinoma, breast ([place])    Anxiety    Colonic polyp    5 mm adenoma 1977   Degenerative joint disease    Fibromyalgia    GERD (gastroesophageal reflux disease)    Hemorrhoids    History of anemia    History of shingles    Hyperlipidemia    Hypertension    Hypothyroidism    IBS (  irritable bowel syndrome)    NASH (nonalcoholic steatohepatitis)    Osteopenia    Personal history of chemotherapy 2005   Left Breast Cancer   Personal history of radiation therapy 2005   Left Breast Cancer   UTI (urinary tract infection) 03/01/2015   Wears glasses     Patient's surgical history, family medical history, social history,  medications and allergies were all reviewed in Epic    Current Outpatient Medications  Medication Sig Dispense Refill   escitalopram (LEXAPRO) 10 MG tablet Take 1 tablet (10 mg total) by mouth daily. For anxiety. 90 tablet 3   ferrous sulfate 325 (65 FE) MG tablet Take 325 mg by mouth daily with breakfast.     hydrOXYzine (ATARAX/VISTARIL) 10 MG tablet TAKE 1 TABLET BY MOUTH EVERY DAY AS NEEDED FOR ANXIETY 90 tablet 0   levothyroxine (SYNTHROID) 75 MCG tablet Take 1 tablet by mouth every morning on an empty stomach with water only.  No food or other medications for 30 minutes. 90 tablet 3   nadolol (CORGARD) 20 MG tablet Take 1 tablet (20 mg total) by mouth daily. 90 tablet 3   omeprazole (PRILOSEC) 40 MG capsule Take 1 capsule (40 mg total) by mouth 2 (two) times daily before a meal. 60 capsule 5   No current facility-administered medications for this visit.    Physical Exam:     BP (!) 146/80   Pulse 95   Ht 5\' 5"  (1.651 m)   Wt 182 lb (82.6 kg)   SpO2 (!) 54%   BMI 30.29 kg/m   GENERAL:  Pleasant female in NAD PSYCH: : Cooperative, normal affect ABDOMEN:  Nondistended, soft, nontender.  EXT: Trace bilateral LE edema.  Appears unchanged from chronic for many years per patient and family members Musculoskeletal:  Normal muscle tone, normal strength NEURO: Alert and oriented x 3, no focal neurologic deficits   IMPRESSION and PLAN:    1) NASH Cirrhosis 2) Esophageal varices 3) Portal hypertensive gastropathy 4) Splenomegaly 5) Coagulopathy  Discussed diagnosis at length today with the patient and family members present with her.  Essentially new diagnosis of decompensated cirrhosis presumably 2/2 NASH based on history.  Discussed the role/utility of extended serologic evaluation to evaluate for concomitant liver diseases, which she politely declined based on known clinical history, previous biopsy, and age.  Plan for the following:  - Check HAV, HBV with vaccines as  appropriate - Consider repeat EGD for EV surveillance in 2 years - Continue nadolol - Low-sodium diet - Based on trace radiographic ascites only, would like to defer starting Lasix/Aldactone  6) Pancreatic cysts 7) Right liver lesion on imaging MRCP with subtle 6 mm lesion in the right liver and 2 pancreatic cysts measuring 1.5 x 0.6 cm and the smaller one at 8 mm.  Discussed these findings at length today with plan as follows: - CT three-phase liver and pancreas protocol in 3-6 months.  Per patient request, would like to do this in January - Patient did not want to proceed with EUS at this juncture  8) Iron deficiency anemia - Presumably 2/2 gastric polyps in the setting of portal hypertensive gastropathy and coagulopathy, treated at the time of EGD in 05/2021.  Resolution of dark stools without intervention - Recheck CBC today - Continue oral iron - Recheck CBC and iron panel in 3 months  9) GERD - Well-controlled on current therapy    I spent 40 minutes of time, including in depth chart review, independent review of  results as outlined above, communicating results with the patient directly, face-to-face time with the patient, coordinating care, ordering studies and medications as appropriate, and documentation.        Lavena Bullion ,DO, FACG 07/02/2021, 3:24 PM

## 2021-07-02 NOTE — Patient Instructions (Signed)
If you are age 81 or older, your body mass index should be between 23-30. Your Body mass index is 30.29 kg/m. If this is out of the aforementioned range listed, please consider follow up with your Primary Care Provider.  If you are age 70 or younger, your body mass index should be between 19-25. Your Body mass index is 30.29 kg/m. If this is out of the aformentioned range listed, please consider follow up with your Primary Care Provider.   __________________________________________________________  The [facility] providers would like to encourage you to use [facility] to communicate with providers for non-urgent requests or questions.  Due to long hold times on the telephone, sending your provider a message by [facility] may be a faster and more efficient way to get a response.  Please allow 48 business hours for a response.  Please remember that this is for non-urgent requests.   Please go to the lab on the 2nd floor [address] before you leave the office today.     Thank you for choosing me and [facility].  Vito Cirigliano, D.O.

## 2021-07-03 LAB — HEPATITIS B SURFACE ANTIBODY,QUALITATIVE: Hep B S Ab: NONREACTIVE

## 2021-07-03 LAB — HEPATITIS A ANTIBODY, TOTAL: Hepatitis A AB,Total: NONREACTIVE

## 2021-07-03 LAB — CBC
HCT: 35 % — ABNORMAL LOW (ref 36.0–46.0)
Hemoglobin: 11.1 g/dL — ABNORMAL LOW (ref 12.0–15.0)
MCHC: 31.6 g/dL (ref 30.0–36.0)
MCV: 81.1 fl (ref 78.0–100.0)
Platelets: 183 10*3/uL (ref 150.0–400.0)
RBC: 4.31 Mil/uL (ref 3.87–5.11)
RDW: 22 % — ABNORMAL HIGH (ref 11.5–15.5)
WBC: 6.6 10*3/uL (ref 4.0–10.5)

## 2021-07-03 LAB — HEPATITIS B CORE ANTIBODY, TOTAL: Hep B Core Total Ab: NONREACTIVE

## 2021-07-05 ENCOUNTER — Ambulatory Visit (INDEPENDENT_AMBULATORY_CARE_PROVIDER_SITE_OTHER): Payer: Medicare HMO

## 2021-07-05 ENCOUNTER — Telehealth: Payer: Self-pay | Admitting: General Surgery

## 2021-07-05 DIAGNOSIS — K760 Fatty (change of) liver, not elsewhere classified: Secondary | ICD-10-CM

## 2021-07-05 DIAGNOSIS — K7469 Other cirrhosis of liver: Secondary | ICD-10-CM

## 2021-07-05 DIAGNOSIS — E785 Hyperlipidemia, unspecified: Secondary | ICD-10-CM

## 2021-07-05 DIAGNOSIS — I10 Essential (primary) hypertension: Secondary | ICD-10-CM

## 2021-07-05 NOTE — Patient Instructions (Signed)
Visit Information:  Thank you for taking the time to speak with me today.   PATIENT GOALS:  Goals Addressed             This Visit's Progress    Track and Manage My Blood Pressure-Hypertension / Hyperlipidemia ( High cholesterol), NASH Cirrhosis   On track    Timeframe:  Long-Range Goal Priority:  Medium Start Date:    05/09/2021                         Expected End Date: 09/27/2021                     Follow Up Date 09/19/2021  - Continue to check blood pressure at least 1 - 3 times per week and record in log/ diary.  - Continue to take your medications as prescribed and refill timely - Follow up with your provider as recommended.  - Have your lab work done as recommended by your provider.  - Follow a heart healthy / low salt diet:  Eat low salt and heart healthy meals full of fruits, vegetables, Whole grains, lean protein and limit fat and sugars.  - Incorporate exercise as tolerated in your routine - Review education article on NASH diet sent to you in my chart.     Why is this important?   You won't feel high blood pressure, but it can still hurt your blood vessels.  High blood pressure can cause heart or kidney problems. It can also cause a stroke.  Making lifestyle changes like losing a little weight or eating less salt will help.  Checking your blood pressure at home and at different times of the day can help to control blood pressure.  If the doctor prescribes medicine remember to take it the way the doctor ordered.  Call the office if you cannot afford the medicine or if there are questions about it.             Patient verbalizes understanding of instructions provided today and agrees to view in [place].   The patient has been provided with contact information for the care management team and has been advised to call with any health related questions or concerns.  The care management team will reach out to the patient again over the next 1-2 months    Quinn Plowman RN,BSN,CCM RN Case Manager [place]  [phone]

## 2021-07-05 NOTE — Telephone Encounter (Signed)
-----   Message from [place], DO sent at 07/04/2021  3:02 PM EDT ----- Hepatitis A and B antibodies were both negative.  Please set up for hepatitis A/B vaccine series.  Please also see if the lab can add on Hep Bs Ag lab.  If not, will need collection prior to starting vaccine series.  CBC demonstrates uptrending H/H, consistent with successful recent endoscopic intervention.  Repeat CBC in 3 months.

## 2021-07-05 NOTE — Telephone Encounter (Signed)
Spoke with the patient and she verbalized understanding of her hep a/b antibodies. She would like to wait until 09/2021 to start on vaccinations. I also expressed that I needed to have her come back in 09/2021 for labwork. The patient verbalized understanding and will schedule in January 2023

## 2021-07-05 NOTE — Chronic Care Management (AMB) (Signed)
Chronic Care Management   CCM RN Visit Note  07/05/2021 Name: [name] MRN: [id number] DOB: Oct 21, 1940  Subjective: [name] is a 80 y.o. year old female who is a primary care patient of [name], NP. The care management team was consulted for assistance with disease management and care coordination needs.    Engaged with patient by telephone for follow up visit in response to provider referral for case management and/or care coordination services.   Consent to Services:  The patient was given information about Chronic Care Management services, agreed to services, and gave verbal consent prior to initiation of services.  Please see initial visit note for detailed documentation.   Patient agreed to services and verbal consent obtained.   Assessment: Review of patient past medical history, allergies, medications, health status, including review of consultants reports, laboratory and other test data, was performed as part of comprehensive evaluation and provision of chronic care management services.   SDOH (Social Determinants of Health) assessments and interventions performed:    CCM Care Plan  Allergies  Allergen Reactions   Ciprofloxacin Diarrhea   Codeine     REACTION: nausea and vomiting    Outpatient Encounter Medications as of 07/05/2021  Medication Sig   escitalopram (LEXAPRO) 10 MG tablet Take 1 tablet (10 mg total) by mouth daily. For anxiety.   ferrous sulfate 325 (65 FE) MG tablet Take 325 mg by mouth daily with breakfast.   hydrOXYzine (ATARAX/VISTARIL) 10 MG tablet TAKE 1 TABLET BY MOUTH EVERY DAY AS NEEDED FOR ANXIETY   levothyroxine (SYNTHROID) 75 MCG tablet Take 1 tablet by mouth every morning on an empty stomach with water only.  No food or other medications for 30 minutes.   nadolol (CORGARD) 20 MG tablet Take 1 tablet (20 mg total) by mouth daily.   omeprazole (PRILOSEC) 40 MG capsule Take 1 capsule (40 mg total) by mouth 2 (two) times  daily before a meal.   No facility-administered encounter medications on file as of 07/05/2021.    Patient Active Problem List   Diagnosis Date Noted   Esophageal varices without bleeding ([place])    Iron deficiency anemia    Multiple gastric polyps    AVM (arteriovenous malformation) of duodenum, acquired    Gastrointestinal hemorrhage with melena 05/28/2021   Allergic rhinitis 05/02/2019   Acute non-recurrent sinusitis 05/17/2018   Neurodermatitis 05/17/2018   Medicare annual wellness visit, subsequent 02/17/2018   Preventative health care 02/13/2016   Dysuria 08/16/2015   Chronic back pain 07/03/2015   Hepatic steatosis 11/27/2009   COLONIC POLYPS, ADENOMATOUS, HX OF 11/27/2009   Malignant neoplasm of female breast ([place]) 10/21/2007   Hypothyroidism 10/21/2007   Disorder of bone and cartilage 10/21/2007   HLD (hyperlipidemia) 09/17/2007   GAD (generalized anxiety disorder) 09/17/2007   Essential hypertension 09/17/2007   GERD 09/17/2007   IRRITABLE BOWEL SYNDROME 09/17/2007   FIBROMYALGIA 09/17/2007    Conditions to be addressed/monitored:HTN, HLD, and NASH  Cirrhosis  Care Plan : Cardiovascular  Updates made by Dannielle Karvonen, RN since 07/05/2021 12:00 AM     Problem: Hypertension / Hyperlipidemia/    Priority: Medium     Long-Range Goal: Hypertension / Hyperlipidemia /  Monitored   Start Date: 05/09/2021  Expected End Date: 09/27/2021  This Visit's Progress: On track  Recent Progress: On track  Priority: Medium  Note:   Objective:  Last practice recorded BP readings:  BP Readings from Last 3 Encounters:  07/02/21 (!) 146/80  06/05/21 (!) 143/61  05/31/21 (!) 122/55  Current Barriers: Patient reports 2 ED visits since last outreach with [facility] regarding GI hemorrhage.  Patient after follow up with doctors and testing she has been diagnosed with NASH cirrhosis. Patient states,  " I am feeling pretty good overall."  She reports having some periods of fatigue.  She  states she may have to rest for a time then she continues on with her routine.   Patient states her initial follow up with the gastroenterologist was 05/30/2021.  Patient states she will start her Hepatis A & B shots  in January 2023 and will have a follow up CT scan.  Patient states she was given information regarding the NASH cirrhosis.  Patient states she continues to check her blood pressure daily in am and pm.  She reports recent blood pressures have ranged from 120/60 to 130/ 70's with a single reading of 170/70.   Knowledge Deficits related to long term care plan for self management of hypertension , hyperlipidemia, Karlene Lineman Cirrhosis:  Patient states she monitors her blood pressure 1 x per week. She reports she is able to afford her medications and takes them as prescribed. Patient states she has transportation her appointments.  Case Manager Clinical Goal(s):  patient will attend scheduled medical appointments patient will demonstrate improved adherence to prescribed treatment plan for hypertension as evidenced by taking all medications as prescribed, monitoring and recording blood pressure as directed, adhering to low sodium/DASH diet Check blood pressure regularly and record.  Take her medications as prescribed.  Follow up with her providers as recommended.  Interventions:  Collaboration with [name], NP regarding development and update of comprehensive plan of care as evidenced by provider attestation and co-signature Inter-disciplinary care team collaboration (see longitudinal plan of care) Evaluation of current treatment plan related to hypertension self management and patient's adherence to plan as established by provider. Reviewed medications with patient and discussed importance of compliance Discussed plans with patient for ongoing care management follow up and provided patient with direct contact information for care management team Advised patient, providing education and  rationale, to monitor blood pressure daily and record, calling PCP for findings outside established parameters.  Reviewed scheduled/upcoming provider appointments Sent education article to patient in My Chart on NASH diet Self-Care Activities: - Self administers medications as prescribed Attends all scheduled provider appointments Calls provider office for new concerns, questions, or BP outside discussed parameters Checks BP and records as discussed Follows a low sodium diet/DASH diet Patient Goals: - Continue to check blood pressure at least 1 - 3 times per week and record in log/ diary.  - Continue to take your medications as prescribed and refill timely - Follow up with your provider as recommended.  - Have your lab work done as recommended by your provider.  - Follow a heart healthy / low salt diet:  Eat low salt and heart healthy meals full of fruits, vegetables, Whole grains, lean protein and limit fat and sugars.  - Incorporate exercise as tolerated in your routine - Review education article on NASH diet sent to you in my chart.  Follow Up Plan: The patient has been provided with contact information for the care management team and has been advised to call with any health related questions or concerns.  The care management team will reach out to the patient again over the next 1-2 months      Plan:The patient has been provided with contact information for the care management  team and has been advised to call with any health related questions or concerns.  and The care management team will reach out to the patient again over the next 1-2 months. Quinn Plowman RN,BSN,CCM RN Case Manager [place]  [phone]

## 2021-07-05 NOTE — Telephone Encounter (Signed)
-----   Message from [place], DO sent at 07/04/2021  3:02 PM EDT ----- Hepatitis A and B antibodies were both negative.  Please set up for hepatitis A/B vaccine series.  Please also see if the lab can add on Hep Bs Ag lab.  If not, will need collection prior to starting vaccine series.  CBC demonstrates uptrending H/H, consistent with successful recent endoscopic intervention.  Repeat CBC in 3 months.

## 2021-07-16 ENCOUNTER — Other Ambulatory Visit: Payer: Self-pay | Admitting: Primary Care

## 2021-07-16 DIAGNOSIS — F411 Generalized anxiety disorder: Secondary | ICD-10-CM

## 2021-07-16 NOTE — Telephone Encounter (Signed)
Received refill request for Lexapro 10 mg, I sent a full years worth of refills in August 2022.  Did it go through?  Not sure why I would be receiving another refill request.

## 2021-07-22 NOTE — Telephone Encounter (Signed)
Left message to return call to our office.  

## 2021-07-24 NOTE — Telephone Encounter (Signed)
Left message to return call to our office.  This [facility] is coming from [facility] the last script was sent to [facility]. Want to make sure patient is aware and requested this refill if so she will need to call [facility] and have scrip transferred to them.

## 2021-07-29 DIAGNOSIS — I10 Essential (primary) hypertension: Secondary | ICD-10-CM

## 2021-07-29 DIAGNOSIS — E785 Hyperlipidemia, unspecified: Secondary | ICD-10-CM

## 2021-08-02 ENCOUNTER — Telehealth: Payer: Self-pay | Admitting: *Deleted

## 2021-08-02 NOTE — Chronic Care Management (AMB) (Signed)
  Care Management   Note  08/02/2021 Name: JERIANNE ANSELMO MRN: [id number] DOB: 14-Apr-1941  NOREENE BOREMAN is a 80 y.o. year old female who is a primary care patient of [name], NP and is actively engaged with the care management team. I reached out to [name] by phone today to assist with re-scheduling a follow up visit with the RN Case Manager  Follow up plan: Unsuccessful telephone outreach attempt made. A HIPAA compliant phone message was left for the patient providing contact information and requesting a return call.   Julian Hy, [place] Management  Direct Dial: [phone]

## 2021-08-13 ENCOUNTER — Other Ambulatory Visit: Payer: Self-pay | Admitting: Primary Care

## 2021-08-13 DIAGNOSIS — J0140 Acute pansinusitis, unspecified: Secondary | ICD-10-CM

## 2021-08-15 NOTE — Chronic Care Management (AMB) (Signed)
Pt scheduled with RNCM by RN for 08/26/2021

## 2021-08-16 ENCOUNTER — Other Ambulatory Visit: Payer: Self-pay | Admitting: Primary Care

## 2021-08-16 DIAGNOSIS — F411 Generalized anxiety disorder: Secondary | ICD-10-CM

## 2021-08-16 NOTE — Telephone Encounter (Signed)
[facility] 10 mg, however I sent a 1 year supply of this medication in August 2022 to [facility] in [place].  Which pharmacy is she using?

## 2021-08-26 ENCOUNTER — Telehealth: Payer: Medicare HMO

## 2021-08-26 ENCOUNTER — Telehealth: Payer: Self-pay

## 2021-08-26 ENCOUNTER — Telehealth: Payer: Self-pay | Admitting: *Deleted

## 2021-08-26 NOTE — Telephone Encounter (Signed)
  Care Management   Follow Up Note   08/26/2021 Name: [name] MRN: [id number] DOB: 1940-12-19   Referred by: [name], NP Reason for referral : Chronic Care Management (RNCM telephone follow up )   An unsuccessful telephone outreach was attempted today. The patient was referred to the case management team for assistance with care management and care coordination.  Telephone call to listed home number. Unable to reach patient or leave voice message. Message states call cannot be completed as dialed. Called number x 2.  Telephone call to listed mobile number. Unable to reach patient.  HIPAA compliant message left with phone number and return call request.   Follow Up Plan: The care management team will reach out to the patient again over the next 14 days.   Quinn Plowman RN,BSN,CCM RN Case Manager [place]  [phone]

## 2021-08-26 NOTE — Chronic Care Management (AMB) (Signed)
  Care Management   Note  08/26/2021 Name: [name] MRN: [id number] DOB: Mar 12, 1941  [name] is a 80 y.o. year old female who is a primary care patient of [name], NP and is actively engaged with the care management team. I reached out to [name] by phone today to assist with re-scheduling a follow up visit with the RN Case Manager  Follow up plan: Unsuccessful telephone outreach attempt made. A HIPAA compliant phone message was left for the patient providing contact information and requesting a return call.   Julian Hy, [place] Management  Direct Dial: [phone]

## 2021-08-29 ENCOUNTER — Other Ambulatory Visit: Payer: Self-pay

## 2021-08-29 ENCOUNTER — Ambulatory Visit
Admission: RE | Admit: 2021-08-29 | Discharge: 2021-08-29 | Disposition: A | Discharge status: Home / Self Care | Payer: Medicare HMO | Source: Ambulatory Visit | Attending: Primary Care | Admitting: Primary Care

## 2021-08-29 DIAGNOSIS — R921 Mammographic calcification found on diagnostic imaging of breast: Secondary | ICD-10-CM | POA: Diagnosis not present

## 2021-08-29 DIAGNOSIS — Z853 Personal history of malignant neoplasm of breast: Secondary | ICD-10-CM | POA: Diagnosis not present

## 2021-08-29 DIAGNOSIS — R922 Inconclusive mammogram: Secondary | ICD-10-CM | POA: Diagnosis not present

## 2021-09-02 NOTE — Chronic Care Management (AMB) (Signed)
  Care Management   Note  09/02/2021 Name: [name] MRN: [id number] DOB: 16-Sep-1941  [name] is a 80 y.o. year old female who is a primary care patient of [name], NP and is actively engaged with the care management team. I reached out to [name] by phone today to assist with re-scheduling a follow up visit with the RN Case Manager  Follow up plan: Telephone appointment with care management team member scheduled for: 10/29/2021  Julian Hy, [place], [place] Management  Direct Dial: [phone]

## 2021-09-03 NOTE — Telephone Encounter (Signed)
Left message to return call to our office.  

## 2021-09-06 NOTE — Telephone Encounter (Signed)
Left message to return call to our office.  

## 2021-09-19 ENCOUNTER — Telehealth: Payer: Medicare HMO

## 2021-09-20 ENCOUNTER — Other Ambulatory Visit: Payer: Self-pay | Admitting: Primary Care

## 2021-09-20 DIAGNOSIS — F411 Generalized anxiety disorder: Secondary | ICD-10-CM

## 2021-09-20 NOTE — Telephone Encounter (Signed)
[facility], however, I sent a 1 year supply in August 2022 to [facility] in [place].  Is she using [facility] or [facility]?

## 2021-09-24 NOTE — Telephone Encounter (Signed)
Left message to return call to our office.  

## 2021-09-25 NOTE — Telephone Encounter (Signed)
Spoke with [name].  She states she is using [facility] for the escitalopram and her BP medication because it is much cheaper at [facility].  [facility].  FYI to [place].

## 2021-10-25 ENCOUNTER — Telehealth: Payer: Self-pay | Admitting: *Deleted

## 2021-10-25 NOTE — Chronic Care Management (AMB) (Signed)
°  Care Management   Note  10/25/2021 Name: [name] MRN: [id number] DOB: Dec 28, 1940  [name] is a 81 y.o. year old female who is a primary care patient of [name], NP and is actively engaged with the care management team. I reached out to [name] by phone today to assist with re-scheduling a follow up visit with the RN Case Manager  Follow up plan: Unsuccessful telephone outreach attempt made. A HIPAA compliant phone message was left for the patient providing contact information and requesting a return call.   Julian Hy, [place] Management  Direct Dial: [phone]

## 2021-10-29 ENCOUNTER — Telehealth: Payer: Medicare HMO

## 2021-11-04 ENCOUNTER — Telehealth: Payer: Self-pay | Admitting: Gastroenterology

## 2021-11-04 NOTE — Telephone Encounter (Signed)
Returned daughter's call. Pt's daughter states that insurance will not cover CT scan because it is too soon. Asked pt's daughter if she could contact insurance company to see what they will cover. Pt's daughter stated she would contact insurance company and then call us back.

## 2021-11-04 NOTE — Telephone Encounter (Signed)
Inbound call from patient daughter Arrie Aran. States her mother was to have a follow up CT of her liver but received a letter from insurance stating they will not cover. Would like to know what to do next

## 2021-11-05 NOTE — Telephone Encounter (Signed)
Secure staff message sent to Joey to follow up on Ct auth.

## 2021-11-11 ENCOUNTER — Telehealth (HOSPITAL_BASED_OUTPATIENT_CLINIC_OR_DEPARTMENT_OTHER): Payer: Self-pay

## 2021-11-11 ENCOUNTER — Telehealth: Payer: Self-pay | Admitting: Gastroenterology

## 2021-11-11 DIAGNOSIS — K760 Fatty (change of) liver, not elsewhere classified: Secondary | ICD-10-CM

## 2021-11-11 DIAGNOSIS — K7469 Other cirrhosis of liver: Secondary | ICD-10-CM

## 2021-11-11 DIAGNOSIS — K862 Cyst of pancreas: Secondary | ICD-10-CM

## 2021-11-11 NOTE — Telephone Encounter (Signed)
Place new order for patients ct

## 2021-11-11 NOTE — Telephone Encounter (Signed)
Case was denied by [employer], an opportunity for a peer to peer is available (CT SCAN)  Case#  [id number] [phone] Opt 1

## 2021-11-19 NOTE — Chronic Care Management (AMB) (Signed)
°  Care Management   Note  11/19/2021 Name: ARIELL GUNNELS MRN: [id number] DOB: [birth year]  GLYNN FREAS is a 81 y.o. year old female who is a primary care patient of [name], NP and is actively engaged with the care management team. I reached out to [name] by phone today to assist with re-scheduling a follow up visit with the RN Case Manager  Follow up plan: Telephone appointment with care management team member scheduled for: 12/03/2021  Julian Hy, [place], [place] Management  Direct Dial: [phone]

## 2021-12-03 ENCOUNTER — Ambulatory Visit (INDEPENDENT_AMBULATORY_CARE_PROVIDER_SITE_OTHER): Payer: Medicare HMO

## 2021-12-03 DIAGNOSIS — I10 Essential (primary) hypertension: Secondary | ICD-10-CM

## 2021-12-03 DIAGNOSIS — E785 Hyperlipidemia, unspecified: Secondary | ICD-10-CM

## 2021-12-03 DIAGNOSIS — K760 Fatty (change of) liver, not elsewhere classified: Secondary | ICD-10-CM

## 2021-12-03 NOTE — Patient Instructions (Signed)
Visit Information ? ?Thank you for taking time to visit with me today. Please don't hesitate to contact me if I can be of assistance to you before our next scheduled telephone appointment. ? ?Following are the goals we discussed today:  ?Take medications as prescribed   ?Attend all scheduled provider appointments ?Call pharmacy for medication refills 3-7 days in advance of running out of medications ?Call provider office for new concerns or questions  ?- Continue to check blood pressure at least 1 - 3 times per week and record in log/ diary.  ?- Have your lab work done as recommended by your provider.  ?- Follow a heart healthy / low salt diet:  Eat low salt and heart healthy meals full of fruits, vegetables, Whole grains, lean protein and limit fat and sugars.  ?- Incorporate exercise as tolerated in your routine ? ?Our next appointment is by telephone on 01/17/22 at 10:00 am ? ?Please call the care guide team at [phone] if you need to cancel or reschedule your appointment.  ? ?If you are experiencing a Mental Health or [place] or need someone to talk to, please call the Suicide and Crisis Lifeline: 988 ?call 1-800-273-TALK (toll free, 24 hour hotline)  ? ?Patient verbalizes understanding of instructions and care plan provided today and agrees to view in [place]. Active MyChart status confirmed with patient.   ? ?Quinn Plowman RN,BSN,CCM ?RN Case Manager ?[place]  ?[phone] ? ?

## 2021-12-03 NOTE — Chronic Care Management (AMB) (Signed)
Chronic Care Management   CCM RN Visit Note  12/03/2021 Name: [name] MRN: [id number] DOB: 1941/04/25  Subjective: [name] is a 81 y.o. year old female who is a primary care patient of [name], NP. The care management team was consulted for assistance with disease management and care coordination needs.    Engaged with patient by telephone for follow up visit in response to provider referral for case management and/or care coordination services.   Consent to Services:  The patient was given information about Chronic Care Management services, agreed to services, and gave verbal consent prior to initiation of services.  Please see initial visit note for detailed documentation.   Patient agreed to services and verbal consent obtained.   Assessment: Review of patient past medical history, allergies, medications, health status, including review of consultants reports, laboratory and other test data, was performed as part of comprehensive evaluation and provision of chronic care management services.   SDOH (Social Determinants of Health) assessments and interventions performed:    CCM Care Plan  Allergies  Allergen Reactions   Ciprofloxacin Diarrhea   Codeine     REACTION: nausea and vomiting    Outpatient Encounter Medications as of 12/03/2021  Medication Sig   Cholecalciferol (VITAMIN D) 125 MCG (5000 UT) CAPS Take 125 mcg by mouth. Takes 1 per day   Cranberry-Vitamin C-Vitamin E (CRANBERRY PLUS VITAMIN C) 4200-20-3 MG-MG-UNIT CAPS Take 4,200 mg by mouth. Takes 1 per day   escitalopram (LEXAPRO) 10 MG tablet Take 1 tablet (10 mg total) by mouth daily. For anxiety.   ferrous sulfate 325 (65 FE) MG tablet Take 325 mg by mouth daily with breakfast.   levothyroxine (SYNTHROID) 75 MCG tablet Take 1 tablet by mouth every morning on an empty stomach with water only.  No food or other medications for 30 minutes.   MULTIPLE VITAMIN PO Take by mouth. Patient states  she takes 1 per day.   nadolol (CORGARD) 20 MG tablet Take 1 tablet (20 mg total) by mouth daily.   omeprazole (PRILOSEC) 40 MG capsule Take 1 capsule (40 mg total) by mouth 2 (two) times daily before a meal.   fluticasone (VERAMYST) 27.5 MCG/SPRAY nasal spray PLACE 1 SPRAY INTO THE NOSE 2 (TWO) TIMES DAILY. (Patient not taking: Reported on 12/03/2021)   hydrOXYzine (ATARAX/VISTARIL) 10 MG tablet TAKE 1 TABLET BY MOUTH EVERY DAY AS NEEDED FOR ANXIETY (Patient not taking: Reported on 12/03/2021)   No facility-administered encounter medications on file as of 12/03/2021.    Patient Active Problem List   Diagnosis Date Noted   Esophageal varices without bleeding ([place])    Iron deficiency anemia    Multiple gastric polyps    AVM (arteriovenous malformation) of duodenum, acquired    Gastrointestinal hemorrhage with melena 05/28/2021   Allergic rhinitis 05/02/2019   Acute non-recurrent sinusitis 05/17/2018   Neurodermatitis 05/17/2018   Medicare annual wellness visit, subsequent 02/17/2018   Preventative health care 02/13/2016   Dysuria 08/16/2015   Chronic back pain 07/03/2015   Hepatic steatosis 11/27/2009   COLONIC POLYPS, ADENOMATOUS, HX OF 11/27/2009   Malignant neoplasm of female breast ([place]) 10/21/2007   Hypothyroidism 10/21/2007   Disorder of bone and cartilage 10/21/2007   HLD (hyperlipidemia) 09/17/2007   GAD (generalized anxiety disorder) 09/17/2007   Essential hypertension 09/17/2007   GERD 09/17/2007   IRRITABLE BOWEL SYNDROME 09/17/2007   FIBROMYALGIA 09/17/2007    Conditions to be addressed/monitored:HTN, HLD, and NASH   Care Plan : [facility]  plan of care  Updates made by Dannielle Karvonen, RN since 12/03/2021 12:00 AM   Problem: Chronic disease management education and care coordination needs      Long-Range Goal: Development of plan of care to address chronic disease management and care coordination needs.   Start Date: 12/03/2021  Expected End Date: 02/26/2022  Priority: High   Current Barriers:  Knowledge Deficits related to plan of care for management of HTN, HLD, and NASH  Patient reports she is doing well.  She states her fatigue is better. She states she continues to follow a low salt diet as advised .  Patient reports her blood pressure readings have ranged from 130/70 to 145/80.  Patient reports she has not had the CT of her liver/ pancrease done.  She states her daughter is talking with her insurance company regarding coverage. RNCM Clinical Goal(s):  Patient will verbalize understanding of plan for management of HTN, HLD, and NASH as evidenced by patient self report and/ or notation in chart take all medications exactly as prescribed and will call provider for medication related questions as evidenced by patient self report and/ or notation in chart    attend all scheduled medical appointments:   as evidenced by patient self report and/ or notation in chart        continue to work with RN Care Manager and/or Social Worker to address care management and care coordination needs related to HTN, HLD, and NASH as evidenced by adherence to CM Team Scheduled appointments     through collaboration with Consulting civil engineer, provider, and care team.   Interventions: 1:1 collaboration with primary care provider regarding development and update of comprehensive plan of care as evidenced by provider attestation and co-signature Inter-disciplinary care team collaboration (see longitudinal plan of care) Evaluation of current treatment plan related to  self management and patient's adherence to plan as established by provider  NASH  (Status:  Goal on track:  Yes.)  Long Term Goal Evaluation of current treatment plan related to  NASH ,  self-management and patient's adherence to plan as established by provider. Discussed plans with patient for ongoing care management follow up and provided patient with direct contact information for care management team Advised to continue to follow  a low salt diet, heart healthy diet Reviewed medication and discussed importance of compliance Reviewed scheduled / upcoming provider appointments.    Hyperlipidemia Interventions:  (Status:  Goal on track:  Yes.) Long Term Goal Medication review performed; medication list updated in electronic medical record.  Advised to follow up with provider as recommended Advised to follow a heart healthy diet.   Hypertension Interventions:  (Status:  Goal on track:  Yes.) Long Term Goal Last practice recorded BP readings:  BP Readings from Last 3 Encounters:  07/02/21 (!) 146/80  06/05/21 (!) 143/61  05/31/21 (!) 122/55  Most recent eGFR/CrCl: No results found for: EGFR  No components found for: CRCL  Evaluation of current treatment plan related to hypertension self management and patient's adherence to plan as established by provider Reviewed medications with patient and discussed importance of compliance Discussed plans with patient for ongoing care management follow up and provided patient with direct contact information for care management team Reviewed scheduled/upcoming provider appointments  Advised to continue to monitor blood pressure 1-3 times per week and record.   Patient Goals/Self-Care Activities: Take medications as prescribed   Attend all scheduled provider appointments Call pharmacy for medication refills 3-7 days in advance of running  out of medications Call provider office for new concerns or questions  - Continue to check blood pressure at least 1 - 3 times per week and record in log/ diary.  - Have your lab work done as recommended by your provider.  - Follow a heart healthy / low salt diet:  Eat low salt and heart healthy meals full of fruits, vegetables, Whole grains, lean protein and limit fat and sugars.  - Incorporate exercise as tolerated in your routine            Plan:The patient has been provided with contact information for the care management team and has  been advised to call with any health related questions or concerns.  The care management team will reach out to the patient again over the next 45 days. Quinn Plowman RN,BSN,CCM RN Case Manager [place]  [phone]

## 2021-12-09 ENCOUNTER — Telehealth (HOSPITAL_BASED_OUTPATIENT_CLINIC_OR_DEPARTMENT_OTHER): Payer: Self-pay

## 2021-12-20 ENCOUNTER — Telehealth (HOSPITAL_BASED_OUTPATIENT_CLINIC_OR_DEPARTMENT_OTHER): Payer: Self-pay

## 2021-12-27 DIAGNOSIS — E785 Hyperlipidemia, unspecified: Secondary | ICD-10-CM | POA: Diagnosis not present

## 2021-12-27 DIAGNOSIS — I10 Essential (primary) hypertension: Secondary | ICD-10-CM

## 2022-01-07 NOTE — Telephone Encounter (Signed)
Patient mother called regarding CT scan. She states she spoke with insurance company regarding what codes will be approved. Please advise. ?

## 2022-01-07 NOTE — Telephone Encounter (Signed)
LVM with patients daughter [name] to see about her CT abd wo/w contrast. Spoke to mom as well. Would like it after the 4-24. It was approved until 04-2022 for CT abd w/wo contrast. They can call to schedule it at the [facility] imaging at [phone]. ?

## 2022-01-08 NOTE — Addendum Note (Signed)
Addended by: Curlene Labrum E on: 01/08/2022 01:29 PM ? ? Modules accepted: Orders ? ?

## 2022-01-08 NOTE — Telephone Encounter (Addendum)
Daughter made aware that appt is May 1st at 130pm arrival at 1pm for CT at [facility] NPR at 930am first bottle of contrast 1130am and then 1230pm ?She will need to get contrast before then at [facility] and also get lab work which daughter requested at [employer] office and I told her she needs to call them first to see if that is ok. Patient will need CBC, CMP and Hep Bs Ag if so. She said she will them to see if they will do her lab work but she can if they will allow and she will follow up with Dr C on 5-22 at the [place] office at 2:20pm. Daughter (Shaw voiced understanding.  ?

## 2022-01-10 ENCOUNTER — Other Ambulatory Visit: Payer: Self-pay | Admitting: Primary Care

## 2022-01-13 ENCOUNTER — Other Ambulatory Visit (INDEPENDENT_AMBULATORY_CARE_PROVIDER_SITE_OTHER): Payer: Medicare HMO

## 2022-01-13 DIAGNOSIS — K7469 Other cirrhosis of liver: Secondary | ICD-10-CM | POA: Diagnosis not present

## 2022-01-13 DIAGNOSIS — K862 Cyst of pancreas: Secondary | ICD-10-CM

## 2022-01-13 DIAGNOSIS — K760 Fatty (change of) liver, not elsewhere classified: Secondary | ICD-10-CM

## 2022-01-13 LAB — CBC WITH DIFFERENTIAL/PLATELET
Basophils Absolute: 0 10*3/uL (ref 0.0–0.1)
Basophils Relative: 0.6 % (ref 0.0–3.0)
Eosinophils Absolute: 0 10*3/uL (ref 0.0–0.7)
Eosinophils Relative: 0.4 % (ref 0.0–5.0)
HCT: 42.8 % (ref 36.0–46.0)
Hemoglobin: 14.3 g/dL (ref 12.0–15.0)
Lymphocytes Relative: 13.7 % (ref 12.0–46.0)
Lymphs Abs: 0.9 10*3/uL (ref 0.7–4.0)
MCHC: 33.3 g/dL (ref 30.0–36.0)
MCV: 90.1 fl (ref 78.0–100.0)
Monocytes Absolute: 0.6 10*3/uL (ref 0.1–1.0)
Monocytes Relative: 8.9 % (ref 3.0–12.0)
Neutro Abs: 5 10*3/uL (ref 1.4–7.7)
Neutrophils Relative %: 76.4 % (ref 43.0–77.0)
Platelets: 130 10*3/uL — ABNORMAL LOW (ref 150.0–400.0)
RBC: 4.75 Mil/uL (ref 3.87–5.11)
RDW: 15.2 % (ref 11.5–15.5)
WBC: 6.6 10*3/uL (ref 4.0–10.5)

## 2022-01-13 LAB — COMPREHENSIVE METABOLIC PANEL
ALT: 25 U/L (ref 0–35)
AST: 37 U/L (ref 0–37)
Albumin: 3.5 g/dL (ref 3.5–5.2)
Alkaline Phosphatase: 104 U/L (ref 39–117)
BUN: 12 mg/dL (ref 6–23)
CO2: 27 mEq/L (ref 19–32)
Calcium: 9.4 mg/dL (ref 8.4–10.5)
Chloride: 105 mEq/L (ref 96–112)
Creatinine, Ser: 0.88 mg/dL (ref 0.40–1.20)
GFR: 61.4 mL/min (ref >60.00)
Glucose, Bld: 105 mg/dL — ABNORMAL HIGH (ref 70–99)
Potassium: 4.3 mEq/L (ref 3.5–5.1)
Sodium: 138 mEq/L (ref 135–145)
Total Bilirubin: 1.2 mg/dL (ref 0.2–1.2)
Total Protein: 7.2 g/dL (ref 6.0–8.3)

## 2022-01-14 LAB — HEPATITIS B SURFACE ANTIGEN: Hepatitis B Surface Ag: NONREACTIVE

## 2022-01-17 ENCOUNTER — Ambulatory Visit (INDEPENDENT_AMBULATORY_CARE_PROVIDER_SITE_OTHER): Payer: Medicare HMO

## 2022-01-17 DIAGNOSIS — K760 Fatty (change of) liver, not elsewhere classified: Secondary | ICD-10-CM

## 2022-01-17 DIAGNOSIS — I10 Essential (primary) hypertension: Secondary | ICD-10-CM

## 2022-01-17 DIAGNOSIS — E785 Hyperlipidemia, unspecified: Secondary | ICD-10-CM

## 2022-01-17 NOTE — Patient Instructions (Signed)
Visit Information ? ?Thank you for taking time to visit with me today. Please don't hesitate to contact me if I can be of assistance to you before our next scheduled telephone appointment. ? ?Following are the goals we discussed today:  ?Continue to take medications as prescribed   ?Attend all scheduled provider appointments ?Call pharmacy for medication refills 3-7 days in advance of running out of medications ?Call provider office for new concerns or questions  ?- Continue to check blood pressure at least 1 - 3 times per week and record in log/ diary.  ?- Follow a heart healthy / low salt diet:  Eat low salt and heart healthy meals full of fruits, vegetables, Whole grains, lean protein and limit fat and sugars.  ?- Incorporate exercise as tolerated in your routine ? ?Our next appointment is by telephone on 03/10/22 at 10:00 am ? ?Please call the care guide team at [phone] if you need to cancel or reschedule your appointment.  ? ?If you are experiencing a Mental Health or [place] or need someone to talk to, please call the Suicide and Crisis Lifeline: 988 ?call 1-800-273-TALK (toll free, 24 hour hotline)  ? ?Patient verbalizes understanding of instructions and care plan provided today and agrees to view in [place]. Active MyChart status confirmed with patient.   ? ?Quinn Plowman RN,BSN,CCM ?RN Case Manager ?[place]  ?[phone] ? ?

## 2022-01-17 NOTE — Chronic Care Management (AMB) (Signed)
?Chronic Care Management  ? ?CCM RN Visit Note ? ?01/17/2022 ?Name: [name] MRN: [id number] DOB: 07/14/1941 ? ?Subjective: ?[name] is a 81 y.o. year old female who is a primary care patient of [name], NP. The care management team was consulted for assistance with disease management and care coordination needs.   ? ?Engaged with patient by telephone for follow up visit in response to provider referral for case management and/or care coordination services.  ? ?Consent to Services:  ?The patient was given information about Chronic Care Management services, agreed to services, and gave verbal consent prior to initiation of services.  Please see initial visit note for detailed documentation.  ? ?Patient agreed to services and verbal consent obtained.  ? ?Assessment: Review of patient past medical history, allergies, medications, health status, including review of consultants reports, laboratory and other test data, was performed as part of comprehensive evaluation and provision of chronic care management services.  ? ?SDOH (Social Determinants of Health) assessments and interventions performed:   ? ?CCM Care Plan ? ?Allergies  ?Allergen Reactions  ? Ciprofloxacin Diarrhea  ? Codeine   ?  REACTION: nausea and vomiting  ? ? ?Outpatient Encounter Medications as of 01/17/2022  ?Medication Sig  ? Cholecalciferol (VITAMIN D) 125 MCG (5000 UT) CAPS Take 125 mcg by mouth. Takes 1 per day  ? Cranberry-Vitamin C-Vitamin E (CRANBERRY PLUS VITAMIN C) 4200-20-3 MG-MG-UNIT CAPS Take 4,200 mg by mouth. Takes 1 per day  ? escitalopram (LEXAPRO) 10 MG tablet Take 1 tablet (10 mg total) by mouth daily. For anxiety.  ? ferrous sulfate 325 (65 FE) MG tablet Take 325 mg by mouth daily with breakfast.  ? fluticasone (VERAMYST) 27.5 MCG/SPRAY nasal spray PLACE 1 SPRAY INTO THE NOSE 2 (TWO) TIMES DAILY. (Patient not taking: Reported on 12/03/2021)  ? hydrOXYzine (ATARAX/VISTARIL) 10 MG tablet TAKE 1 TABLET BY MOUTH  EVERY DAY AS NEEDED FOR ANXIETY (Patient not taking: Reported on 12/03/2021)  ? levothyroxine (SYNTHROID) 75 MCG tablet Take 1 tablet by mouth every morning on an empty stomach with water only.  No food or other medications for 30 minutes.  ? MULTIPLE VITAMIN PO Take by mouth. Patient states she takes 1 per day.  ? nadolol (CORGARD) 20 MG tablet Take 1 tablet (20 mg total) by mouth daily.  ? omeprazole (PRILOSEC) 40 MG capsule Take 1 capsule (40 mg total) by mouth 2 (two) times daily before a meal.  ? ?No facility-administered encounter medications on file as of 01/17/2022.  ? ? ?Patient Active Problem List  ? Diagnosis Date Noted  ? Esophageal varices without bleeding ([place])   ? Iron deficiency anemia   ? Multiple gastric polyps   ? AVM (arteriovenous malformation) of duodenum, acquired   ? Gastrointestinal hemorrhage with melena 05/28/2021  ? Allergic rhinitis 05/02/2019  ? Acute non-recurrent sinusitis 05/17/2018  ? Neurodermatitis 05/17/2018  ? Medicare annual wellness visit, subsequent 02/17/2018  ? Preventative health care 02/13/2016  ? Dysuria 08/16/2015  ? Chronic back pain 07/03/2015  ? Hepatic steatosis 11/27/2009  ? COLONIC POLYPS, ADENOMATOUS, HX OF 11/27/2009  ? Malignant neoplasm of female breast ([name]) 10/21/2007  ? Hypothyroidism 10/21/2007  ? Disorder of bone and cartilage 10/21/2007  ? HLD (hyperlipidemia) 09/17/2007  ? GAD (generalized anxiety disorder) 09/17/2007  ? Essential hypertension 09/17/2007  ? GERD 09/17/2007  ? IRRITABLE BOWEL SYNDROME 09/17/2007  ? FIBROMYALGIA 09/17/2007  ? ? ?Conditions to be addressed/monitored:HTN, HLD, and NASH ? ?Care Plan : [facility] plan  of care  ?Updates made by Dannielle Karvonen, RN since 01/17/2022 12:00 AM  ?  ? ?Problem: Chronic disease management education and care coordination needs   ?  ? ?Long-Range Goal: Development of plan of care to address chronic disease management and care coordination needs.   ?Start Date: 12/03/2021  ?Expected End Date: 03/28/2022  ?Priority:  High  ?Note:   ?Current Barriers:  ?Knowledge Deficits related to plan of care for management of HTN, HLD, and NASH  ?Patient reports she is doing well.  She states she is scheduled for a CT of her liver on 01/27/2022 and follow up with her gastroenterologist on 02/17/22.  Patient states she continues to monitor her blood pressure weekly. She states her blood pressures range from 140/70-80's.   ?RNCM Clinical Goal(s):  ?Patient will verbalize understanding of plan for management of HTN, HLD, and NASH as evidenced by patient self report and/ or notation in chart ?take all medications exactly as prescribed and will call provider for medication related questions as evidenced by patient self report and/ or notation in chart    ?attend all scheduled medical appointments:   as evidenced by patient self report and/ or notation in chart        ?continue to work with Consulting civil engineer and/or Social Worker to address care management and care coordination needs related to HTN, HLD, and NASH as evidenced by adherence to CM Team Scheduled appointments     through collaboration with Consulting civil engineer, provider, and care team.  ? ?Interventions: ?1:1 collaboration with primary care provider regarding development and update of comprehensive plan of care as evidenced by provider attestation and co-signature ?Inter-disciplinary care team collaboration (see longitudinal plan of care) ?Evaluation of current treatment plan related to  self management and patient's adherence to plan as established by provider ? ?NASH  (Status:  Goal on track:  Yes.)  Long Term Goal ?Evaluation of current treatment plan related to  NASH ,  self-management and patient's adherence to plan as established by provider. ?Discussed plans with patient for ongoing care management follow up and provided patient with direct contact information for care management team ?Advised to continue to follow a low salt diet, heart healthy diet ?Reviewed medication and discussed  importance of compliance ?Reviewed scheduled / upcoming provider appointments.  ? ?Hyperlipidemia Interventions:  (Status:  Goal on track:  Yes.) Long Term Goal ?Medication review performed; medication list updated in electronic medical record.  ?Advised to follow up with provider as recommended ?Advised to follow a heart healthy diet.  ? ?Hypertension Interventions:  (Status:  Goal on track:  Yes.) Long Term Goal ?Last practice recorded BP readings:  ?BP Readings from Last 3 Encounters:  ?07/02/21 (!) 146/80  ?06/05/21 (!) 143/61  ?05/31/21 (!) 122/55  ?Most recent eGFR/CrCl: No results found for: EGFR  No components found for: CRCL ? ?Evaluation of current treatment plan related to hypertension self management and patient's adherence to plan as established by provider ?Reviewed medications with patient and discussed importance of compliance ?Discussed plans with patient for ongoing care management follow up and provided patient with direct contact information for care management team ?Reviewed scheduled/upcoming provider appointments  ?Advised to continue to monitor blood pressure 1-3 times per week and record. ?Advised to continue to follow a low salt diet.  ? ?Patient Goals/Self-Care Activities: ?Continue to take medications as prescribed   ?Attend all scheduled provider appointments ?Call pharmacy for medication refills 3-7 days in advance of running out of medications ?  Call provider office for new concerns or questions  ?- Continue to check blood pressure at least 1 - 3 times per week and record in log/ diary.  ?- Follow a heart healthy / low salt diet:  Eat low salt and heart healthy meals full of fruits, vegetables, Whole grains, lean protein and limit fat and sugars.  ?- Incorporate exercise as tolerated in your routine ? ?  ? ? ?  ?  ? ? ?Plan:The patient has been provided with contact information for the care management team and has been advised to call with any health related questions or concerns.  ?The  care management team will reach out to the patient again over the next 2 months . ?Quinn Plowman RN,BSN,CCM ?RN Case Manager ?[place]  ?[phone] ? ? ? ? ? ? ? ? ? ?

## 2022-01-26 DIAGNOSIS — E785 Hyperlipidemia, unspecified: Secondary | ICD-10-CM | POA: Diagnosis not present

## 2022-01-26 DIAGNOSIS — I10 Essential (primary) hypertension: Secondary | ICD-10-CM

## 2022-01-27 ENCOUNTER — Ambulatory Visit (HOSPITAL_COMMUNITY)
Admission: RE | Admit: 2022-01-27 | Discharge: 2022-01-27 | Disposition: A | Discharge status: Home / Self Care | Payer: Medicare HMO | Source: Ambulatory Visit | Attending: Gastroenterology | Admitting: Gastroenterology

## 2022-01-27 ENCOUNTER — Encounter (HOSPITAL_COMMUNITY): Payer: Self-pay

## 2022-01-27 DIAGNOSIS — K760 Fatty (change of) liver, not elsewhere classified: Secondary | ICD-10-CM | POA: Diagnosis not present

## 2022-01-27 DIAGNOSIS — K862 Cyst of pancreas: Secondary | ICD-10-CM | POA: Insufficient documentation

## 2022-01-27 DIAGNOSIS — N281 Cyst of kidney, acquired: Secondary | ICD-10-CM | POA: Diagnosis not present

## 2022-01-27 DIAGNOSIS — K7460 Unspecified cirrhosis of liver: Secondary | ICD-10-CM | POA: Diagnosis not present

## 2022-01-27 DIAGNOSIS — K769 Liver disease, unspecified: Secondary | ICD-10-CM | POA: Diagnosis not present

## 2022-01-27 MED ORDER — SODIUM CHLORIDE (PF) 0.9 % IJ SOLN
INTRAMUSCULAR | Status: AC
  Filled 2022-01-27: qty 50

## 2022-01-27 MED ORDER — IOHEXOL 300 MG/ML  SOLN
100.0000 mL | Freq: Once | INTRAMUSCULAR | Status: AC | PRN
  Administered 2022-01-27: 100 mL via INTRAVENOUS

## 2022-02-05 ENCOUNTER — Other Ambulatory Visit: Payer: Self-pay | Admitting: Physician Assistant

## 2022-02-06 ENCOUNTER — Encounter: Payer: Self-pay | Admitting: Gastroenterology

## 2022-02-06 NOTE — Telephone Encounter (Signed)
Spoke with pt's daughter Arrie Aran. Appt rescheduled to July 18th at 3 pm.  ?

## 2022-02-11 ENCOUNTER — Telehealth: Payer: Self-pay | Admitting: Primary Care

## 2022-02-11 NOTE — Telephone Encounter (Signed)
Left message for patient to call back and schedule Medicare Annual Wellness Visit (AWV) either virtually or phone ? ? ?Last AWV 03/07/19 ?; please schedule at anytime with health coach ? ?I left my direct # [phone]  ?

## 2022-02-17 ENCOUNTER — Ambulatory Visit: Payer: Medicare HMO | Admitting: Gastroenterology

## 2022-03-03 DIAGNOSIS — K7460 Unspecified cirrhosis of liver: Secondary | ICD-10-CM | POA: Diagnosis not present

## 2022-03-03 DIAGNOSIS — D376 Neoplasm of uncertain behavior of liver, gallbladder and bile ducts: Secondary | ICD-10-CM | POA: Insufficient documentation

## 2022-03-03 DIAGNOSIS — I8510 Secondary esophageal varices without bleeding: Secondary | ICD-10-CM | POA: Diagnosis not present

## 2022-03-03 DIAGNOSIS — R188 Other ascites: Secondary | ICD-10-CM | POA: Insufficient documentation

## 2022-03-03 DIAGNOSIS — K7581 Nonalcoholic steatohepatitis (NASH): Secondary | ICD-10-CM | POA: Diagnosis not present

## 2022-03-10 ENCOUNTER — Ambulatory Visit (INDEPENDENT_AMBULATORY_CARE_PROVIDER_SITE_OTHER): Payer: Medicare HMO

## 2022-03-10 DIAGNOSIS — I10 Essential (primary) hypertension: Secondary | ICD-10-CM

## 2022-03-10 DIAGNOSIS — E785 Hyperlipidemia, unspecified: Secondary | ICD-10-CM

## 2022-03-10 DIAGNOSIS — K760 Fatty (change of) liver, not elsewhere classified: Secondary | ICD-10-CM

## 2022-03-10 NOTE — Chronic Care Management (AMB) (Signed)
Chronic Care Management   CCM RN Visit Note  03/10/2022 Name: [name] MRN: [id number] DOB: 03-05-1941  Subjective: [name] is a 81 y.o. year old female who is a primary care patient of [name], NP. The care management team was consulted for assistance with disease management and care coordination needs.    Engaged with patient by telephone for follow up visit in response to provider referral for case management and/or care coordination services.   Consent to Services:  The patient was given information about Chronic Care Management services, agreed to services, and gave verbal consent prior to initiation of services.  Please see initial visit note for detailed documentation.   Patient agreed to services and verbal consent obtained.   Assessment: Review of patient past medical history, allergies, medications, health status, including review of consultants reports, laboratory and other test data, was performed as part of comprehensive evaluation and provision of chronic care management services.   SDOH (Social Determinants of Health) assessments and interventions performed:    CCM Care Plan  Allergies  Allergen Reactions   Ciprofloxacin Diarrhea   Codeine     REACTION: nausea and vomiting    Outpatient Encounter Medications as of 03/10/2022  Medication Sig   Cholecalciferol (VITAMIN D) 125 MCG (5000 UT) CAPS Take 125 mcg by mouth. Takes 1 per day   Cranberry-Vitamin C-Vitamin E (CRANBERRY PLUS VITAMIN C) 4200-20-3 MG-MG-UNIT CAPS Take 4,200 mg by mouth. Takes 1 per day   escitalopram (LEXAPRO) 10 MG tablet Take 1 tablet (10 mg total) by mouth daily. For anxiety.   ferrous sulfate 325 (65 FE) MG tablet Take 325 mg by mouth daily with breakfast.   fluticasone (VERAMYST) 27.5 MCG/SPRAY nasal spray PLACE 1 SPRAY INTO THE NOSE 2 (TWO) TIMES DAILY. (Patient not taking: Reported on 12/03/2021)   hydrOXYzine (ATARAX/VISTARIL) 10 MG tablet TAKE 1 TABLET BY MOUTH  EVERY DAY AS NEEDED FOR ANXIETY (Patient not taking: Reported on 12/03/2021)   levothyroxine (SYNTHROID) 75 MCG tablet Take 1 tablet by mouth every morning on an empty stomach with water only.  No food or other medications for 30 minutes.   MULTIPLE VITAMIN PO Take by mouth. Patient states she takes 1 per day.   nadolol (CORGARD) 20 MG tablet Take 1 tablet (20 mg total) by mouth daily.   omeprazole (PRILOSEC) 40 MG capsule TAKE 1 CAPSULE (40 MG TOTAL) BY MOUTH 2 (TWO) TIMES DAILY BEFORE A MEAL.   No facility-administered encounter medications on file as of 03/10/2022.    Patient Active Problem List   Diagnosis Date Noted   Esophageal varices without bleeding ([place])    Iron deficiency anemia    Multiple gastric polyps    AVM (arteriovenous malformation) of duodenum, acquired    Gastrointestinal hemorrhage with melena 05/28/2021   Allergic rhinitis 05/02/2019   Acute non-recurrent sinusitis 05/17/2018   Neurodermatitis 05/17/2018   Medicare annual wellness visit, subsequent 02/17/2018   Preventative health care 02/13/2016   Dysuria 08/16/2015   Chronic back pain 07/03/2015   Hepatic steatosis 11/27/2009   COLONIC POLYPS, ADENOMATOUS, HX OF 11/27/2009   Malignant neoplasm of female breast ([place]) 10/21/2007   Hypothyroidism 10/21/2007   Disorder of bone and cartilage 10/21/2007   HLD (hyperlipidemia) 09/17/2007   GAD (generalized anxiety disorder) 09/17/2007   Essential hypertension 09/17/2007   GERD 09/17/2007   IRRITABLE BOWEL SYNDROME 09/17/2007   FIBROMYALGIA 09/17/2007    Conditions to be addressed/monitored:HTN, HLD, and NASH  Care Plan : [facility] plan  of care  Updates made by Dannielle Karvonen, RN since 03/10/2022 12:00 AM     Problem: Chronic disease management education and care coordination needs      Long-Range Goal: Development of plan of care to address chronic disease management and care coordination needs.   Start Date: 12/03/2021  Expected End Date: 05/29/2022  Priority:  High  Note:   Current Barriers:  Knowledge Deficits related to plan of care for management of HTN, HLD, and NASH  Patient reports she is doing well.   She reports having follow up visit with her gastroenterologist on 03/03/22.  She states she had blood work done. Denies any change in medication treatment plan.  Patient states she will having repeat imaging of her liver in 3 months and has a follow up visit scheduled with her gastroenterologist on 04/15/22.  Patient reports her annual wellness exam is due.  Patient states her blood pressures have ranged from 130/70's to 160's/70.    RNCM Clinical Goal(s):  Patient will verbalize understanding of plan for management of HTN, HLD, and NASH as evidenced by patient self report and/ or notation in chart take all medications exactly as prescribed and will call provider for medication related questions as evidenced by patient self report and/ or notation in chart    attend all scheduled medical appointments:   as evidenced by patient self report and/ or notation in chart        continue to work with RN Care Manager and/or Social Worker to address care management and care coordination needs related to HTN, HLD, and NASH as evidenced by adherence to CM Team Scheduled appointments     through collaboration with Consulting civil engineer, provider, and care team.   Interventions: 1:1 collaboration with primary care provider regarding development and update of comprehensive plan of care as evidenced by provider attestation and co-signature Inter-disciplinary care team collaboration (see longitudinal plan of care) Evaluation of current treatment plan related to  self management and patient's adherence to plan as established by provider  NASH  (Status:  Goal on track:  Yes.)  Long Term Goal Evaluation of current treatment plan related to  NASH ,  self-management and patient's adherence to plan as established by provider. Discussed plans with patient for ongoing care management  follow up and provided patient with direct contact information for care management team Advised to continue to follow a low salt diet, heart healthy diet Reviewed medication and discussed importance of compliance Reviewed scheduled / upcoming provider appointments.  Reviewed office 03/03/22 office visit note with patient regarding home management recommendations( continue high protein diet from primary plant based foods, avoid red meat, limit sodium to 2,000 mg/ day, exercise and take multivitamin as instructed by provider)  Hyperlipidemia Interventions:  (Status:  Goal on track:  Yes.) Long Term Goal Medication review performed; medication list updated in electronic medical record.  Advised to follow up with provider as recommended Advised to follow a heart healthy diet.  Advised to call primary care provider office and schedule annual wellness visit  Hypertension Interventions:  (Status:  Goal on track:  Yes.) Long Term Goal Last practice recorded BP readings:  BP Readings from Last 3 Encounters:  07/02/21 (!) 146/80  06/05/21 (!) 143/61  05/31/21 (!) 122/55  Most recent eGFR/CrCl: No results found for: "EGFR"  No components found for: "CRCL"  Evaluation of current treatment plan related to hypertension self management and patient's adherence to plan as established by provider Reviewed medications with patient  and discussed importance of compliance Discussed plans with patient for ongoing care management follow up and provided patient with direct contact information for care management team Reviewed scheduled/upcoming provider appointments  Advised to continue to monitor blood pressure 1-3 times per week and record. Advised to continue to follow a low salt diet.  Advised to call primary care provider office and schedule annual wellness visit  Patient Goals/Self-Care Activities: Continue to take medications as prescribed   Attend all scheduled provider appointments Call pharmacy for  medication refills 3-7 days in advance of running out of medications Call provider office for new concerns or questions  - Continue to check blood pressure at least 1 - 3 times per week and record in log/ diary.  - Follow a heart healthy / low salt diet:  Eat low salt and heart healthy meals full of fruits, vegetables, Whole grains, lean protein and limit fat and sugars.  - Incorporate exercise as tolerated in your routine - Contact you primary care provider office to schedule your annual wellness visit.  - Advised to follow provider recommendation regarding management of NASH condition(continue high protein diet from primary plant based foods, avoid red meat, limit sodium to 2,000 mg/ day, exercise and take multivitamin as instructed by provider)            Plan:The patient has been provided with contact information for the care management team and has been advised to call with any health related questions or concerns.  The care management team will reach out to the patient again over the next 2 months . Quinn Plowman RN,BSN,CCM RN Case Manager [place]  [phone]

## 2022-03-10 NOTE — Patient Instructions (Signed)
Visit Information  Thank you for taking time to visit with me today. Please don't hesitate to contact me if I can be of assistance to you before our next scheduled telephone appointment.  Following are the goals we discussed today:  Continue to take medications as prescribed   Attend all scheduled provider appointments Call pharmacy for medication refills 3-7 days in advance of running out of medications Call provider office for new concerns or questions  - Continue to check blood pressure at least 1 - 3 times per week and record in log/ diary.  - Follow a heart healthy / low salt diet:  Eat low salt and heart healthy meals full of fruits, vegetables, Whole grains, lean protein and limit fat and sugars.  - Incorporate exercise as tolerated in your routine - Contact you primary care provider office to schedule your annual wellness visit.  - Advised to follow provider recommendation regarding management of NASH condition(continue high protein diet from primary plant based foods, avoid red meat, limit sodium to 2,000 mg/ day, exercise and take multivitamin as instructed by provider)  Our next appointment is by telephone on 05/05/22 at 11:00 am  Please call the care guide team at [phone] if you need to cancel or reschedule your appointment.   If you are experiencing a Mental Health or [place] or need someone to talk to, please call the Suicide and Crisis Lifeline: 988 call 1-800-273-TALK (toll free, 24 hour hotline)   Patient verbalizes understanding of instructions and care plan provided today and agrees to view in [place]. Active MyChart status and patient understanding of how to access instructions and care plan via MyChart confirmed with patient.     Quinn Plowman RN,BSN,CCM RN Case Manager [place]  [phone]

## 2022-03-28 DIAGNOSIS — I10 Essential (primary) hypertension: Secondary | ICD-10-CM

## 2022-03-28 DIAGNOSIS — E785 Hyperlipidemia, unspecified: Secondary | ICD-10-CM

## 2022-04-15 ENCOUNTER — Telehealth: Payer: Self-pay | Admitting: Primary Care

## 2022-04-15 ENCOUNTER — Ambulatory Visit (INDEPENDENT_AMBULATORY_CARE_PROVIDER_SITE_OTHER): Payer: Medicare HMO | Admitting: Gastroenterology

## 2022-04-15 ENCOUNTER — Encounter: Payer: Self-pay | Admitting: Gastroenterology

## 2022-04-15 VITALS — BP 152/72 | HR 84 | Ht 65.0 in | Wt 174.0 lb

## 2022-04-15 DIAGNOSIS — K7581 Nonalcoholic steatohepatitis (NASH): Secondary | ICD-10-CM

## 2022-04-15 DIAGNOSIS — K862 Cyst of pancreas: Secondary | ICD-10-CM | POA: Diagnosis not present

## 2022-04-15 DIAGNOSIS — K7460 Unspecified cirrhosis of liver: Secondary | ICD-10-CM

## 2022-04-15 DIAGNOSIS — K219 Gastro-esophageal reflux disease without esophagitis: Secondary | ICD-10-CM | POA: Diagnosis not present

## 2022-04-15 DIAGNOSIS — K3189 Other diseases of stomach and duodenum: Secondary | ICD-10-CM | POA: Diagnosis not present

## 2022-04-15 DIAGNOSIS — K766 Portal hypertension: Secondary | ICD-10-CM | POA: Diagnosis not present

## 2022-04-15 DIAGNOSIS — I8510 Secondary esophageal varices without bleeding: Secondary | ICD-10-CM

## 2022-04-15 DIAGNOSIS — K7469 Other cirrhosis of liver: Secondary | ICD-10-CM

## 2022-04-15 NOTE — Patient Instructions (Addendum)
If you are age 82 or older, your body mass index should be between 23-30. Your Body mass index is 28.96 kg/m. If this is out of the aforementioned range listed, please consider follow up with your Primary Care Provider.  _______________________________________________________  The [facility] providers would like to encourage you to use [facility] to communicate with providers for non-urgent requests or questions.  Due to long hold times on the telephone, sending your provider a message by [facility] may be a faster and more efficient way to get a response.  Please allow 48 business hours for a response.  Please remember that this is for non-urgent requests.  _______________________________________________________  Due to recent changes in healthcare laws, you may see the results of your imaging and laboratory studies on MyChart before your provider has had a chance to review them.  We understand that in some cases there may be results that are confusing or concerning to you. Not all laboratory results come back in the same time frame and the provider may be waiting for multiple results in order to interpret others.  Please give [place] 48 hours in order for your provider to thoroughly review all the results before contacting the office for clarification of your results.   Please follow up in 6 months. Give [place] a call at [phone] to schedule an appointment.   Thank you for choosing me and [facility].  Vito Cirigliano, D.O.

## 2022-04-15 NOTE — Telephone Encounter (Signed)
Left message for patient to call back and schedule Medicare Annual Wellness Visit (AWV) either virtually or phone   Last AWV6/8/20    I left my direct # [phone]

## 2022-04-15 NOTE — Progress Notes (Unsigned)
Chief Complaint:    Cirrhosis  GI History: 81 year old female with a history of fibromyalgia, breast cancer, HTN, HLD, hypothyroidism, IBS, NASH, GERD, referred to the GI clinic in 05/2021 for evaluation of anemia. - 04/2021: Was admitted to [facility] following a fall and noted to have Hgb 8.9 (normal 1 year ago) with MCV 77.8.  FOBT positive.  Patient did report 2-8-monthhistory of dark stools.  Continued omeprazole 40 mg/day (history of well-controlled GERD) and ASA 81 mg stopped.  Started on oral iron - 05/30/2021: GI evaluation.  Increased omeprazole to 40 mg bid and scheduled for expedited EGD.  Repeat H/H 9.2/28.9 with MCV/RDW 75.8/17.5.  Ferritin 16.8, iron 8, TIBC 392, sat 2.3%, B12 748, folate >24.  INR 1.4.  Albumin 3.2 otherwise normal liver enzymes and T bili. - 05/31/2021: EGD: Small esophageal varices, multiple gastric polyps with active oozing, single nonbleeding AVM in D2 - [facility] flattened with insufflation, for active oozing polyps in the gastric fundus/body removed with hot snare then clipped closed, mild portal hypertensive gastropathy, small polypoid area with nipple like tip with active oozing in prepyloric antrum clipped closed, single AVM in D2 treated with APC.  Started on nadolol and continued high-dose PPI - 06/12/2021: [facility]: Nodular appearing liver with perihepatic ascites and mild splenomegaly c/w cirrhosis with portal hypertension.  Patent veins.  CBD 1.7 cm with possible 1.4 cm stone in extrahepatic bile duct - 06/27/2021: MRCP: Hepatic cirrhosis characterized by splenomegaly and small volume ascites.  No biliary duct dilatation or filling defects, cystic area in porta hepatus suggestive of chronic postoperative inflammatory process (seroma or biloma), 6 mm subtle focus in the right liver.  Small nonenhancing cystic area in the head of pancreas measuring 1.4 x 0.6 cm and another measuring 8 mm without PD dilation.  Recommended repeat CT  three-phase in 3-6 months - 01/27/2022: CT A/P with pancreas protocol: Cirrhotic liver, 0.7 cm hyperenhancing lesion in right lobe of liver, 0.6 cm lesion in left lobe of the liver.  Intermediate suspicion for HCC.  1.1 x 0.8 cm cystic lesion in HOP without duct dilation.  Likely small IPMN or pseudocyst.     Cirrhosis Evaluation: - Etiology: NASH - Complications: Esophageal varices, splenomegaly, portal hypertensive gastropathy, small volume ascites - HCC screening: UTD - Variceal screening: EGD 05/2021.  Consider repeat in 2024 - Serologic evaluation: Declined extended serologic evaluation based on history - Viral hepatitis vaccination: Checking labs today - Flu vaccine: UTD - Liver biopsy: 2008: Steatohepatitis - Medications: Nadolol - MELD: 11 - Child Pugh score: A      Endoscopic History: - 11/30/2003 EGD with a hiatal hernia and a mildly tortuous esophagus.  Otherwise normal. - 12/06/2009 colonoscopy normal, had a history of a diminutive adenoma removed in 1997, follow-up recommended 10 years  HPI:     Patient is a 81y.o. female presenting to the Gastroenterology Clinic for routine follow-up.  Last seen by me on 07/02/2021.  She is otherwise without any symptoms or active issues today.  CT in 01/2022 with 0.7 cm right hepatic lobe and 0.6 cm left hepatic lobe lesions that were intermediate suspicion for HCC.  [place] [facility]  [place] also with 1.1 x 0.8 cm cystic lesion in the head of the pancreas, which could be IPMN or pseudocyst.  Recommended surveillance imaging.  [facility] - Does not be continued EGD for surveillance while  she is taking nadolol.  Can consider changing to carvedilol. - Plan for repeat imaging in 3 months to monitor stability of the 2 subcentimeter hepatic lesions - AFP normal - Normal liver enzymes, PT/INR, creatinine, sodium - Follow-up in 3 months (06/10/2022)   Review of systems:     No chest  pain, no SOB, no fevers, no urinary sx   Past Medical History:  Diagnosis Date   Adenocarcinoma, breast ([place])    Anxiety    Colonic polyp    5 mm adenoma 1977   Degenerative joint disease    Fibromyalgia    GERD (gastroesophageal reflux disease)    Hemorrhoids    History of anemia    History of shingles    Hyperlipidemia    Hypertension    Hypothyroidism    IBS (irritable bowel syndrome)    NASH (nonalcoholic steatohepatitis)    Osteopenia    Personal history of chemotherapy 2005   Left Breast Cancer   Personal history of radiation therapy 2005   Left Breast Cancer   UTI (urinary tract infection) 03/01/2015   Wears glasses     Patient's surgical history, family medical history, social history, medications and allergies were all reviewed in Epic    Current Outpatient Medications  Medication Sig Dispense Refill   Cholecalciferol (VITAMIN D) 125 MCG (5000 UT) CAPS Take 125 mcg by mouth. Takes 1 per day     Cranberry-Vitamin C-Vitamin E (CRANBERRY PLUS VITAMIN C) 4200-20-3 MG-MG-UNIT CAPS Take 4,200 mg by mouth. Takes 1 per day     escitalopram (LEXAPRO) 10 MG tablet Take 1 tablet (10 mg total) by mouth daily. For anxiety. 90 tablet 3   ferrous sulfate 325 (65 FE) MG tablet Take 325 mg by mouth daily with breakfast.     fluticasone (VERAMYST) 27.5 MCG/SPRAY nasal spray PLACE 1 SPRAY INTO THE NOSE 2 (TWO) TIMES DAILY. 5.9 mL 3   hydrOXYzine (ATARAX/VISTARIL) 10 MG tablet TAKE 1 TABLET BY MOUTH EVERY DAY AS NEEDED FOR ANXIETY 90 tablet 0   levothyroxine (SYNTHROID) 75 MCG tablet Take 1 tablet by mouth every morning on an empty stomach with water only.  No food or other medications for 30 minutes. 90 tablet 3   MULTIPLE VITAMIN PO Take by mouth. Patient states she takes 1 per day.     nadolol (CORGARD) 20 MG tablet Take 1 tablet (20 mg total) by mouth daily. 90 tablet 3   omeprazole (PRILOSEC) 40 MG capsule TAKE 1 CAPSULE (40 MG TOTAL) BY MOUTH 2 (TWO) TIMES DAILY BEFORE A MEAL.  180 capsule 1   No current facility-administered medications for this visit.    Physical Exam:     BP (!) 152/72   Pulse 84   Ht '5\' 5"'$  (1.651 m)   Wt 174 lb (78.9 kg)   BMI 28.96 kg/m   GENERAL:  Pleasant female in NAD PSYCH: : Cooperative, normal affect Musculoskeletal:  Normal muscle tone, normal strength NEURO: Alert and oriented x 3, no focal neurologic deficits   IMPRESSION and PLAN:    1) NASH cirrhosis 2) Esophageal varices 3) Portal hypertensive gastropathy 4) Splenomegaly 5) Hepatic lesions on CT  - UTD on labs - As she is currently being treated with nadolol, likely do not need to repeat upper endoscopy for ongoing EV surveillance - Continue nadolol - Continue low-sodium diet - No history of hepatic encephalopathy.  Only trace ascites on imaging, so no diuretics indicated at this time - Declines Hep A/B vaccine series today -  Follow-up with [facility] with repeat imaging as recommended - Discussed DDx of hepatic lesions noted on CT, to include small HCC, adenomas, benign lesions.  Current plan is for repeat imaging coordinated through [facility] as above  6) Pancreatic cyst - Discussed DDx to include IPMN or benign pancreatic cyst.  Plan for reevaluation at time of repeat imaging as above  7) GERD - Well-controlled on current therapy     RTC in 6 months or sooner as needed  I spent 30 minutes of time, including in depth chart review, independent review of results as outlined above, communicating results with the patient directly, face-to-face time with the patient, coordinating care, and ordering studies and medications as appropriate, and documentation.       Lavena Bullion ,DO, FACG 04/15/2022, 3:31 PM

## 2022-04-21 ENCOUNTER — Telehealth: Payer: Self-pay

## 2022-04-21 NOTE — Telephone Encounter (Signed)
  Care Management   Follow Up Note   04/21/2022 Name: [name] MRN: [id number] DOB: Jun 12, 1941   Referred by: [name], NP Reason for referral : Care coordination   An unsuccessful telephone outreach was attempted today. The patient was referred to the case management team for assistance with care management and care coordination.   Follow Up Plan: The care management team will reach out to the patient again over the next 14 days.   Quinn Plowman RN,BSN,CCM RN Care Manager Coordinator [place]  [phone]

## 2022-04-24 ENCOUNTER — Telehealth: Payer: Self-pay

## 2022-04-24 ENCOUNTER — Ambulatory Visit: Payer: Self-pay

## 2022-04-24 DIAGNOSIS — K7581 Nonalcoholic steatohepatitis (NASH): Secondary | ICD-10-CM

## 2022-04-24 DIAGNOSIS — I10 Essential (primary) hypertension: Secondary | ICD-10-CM

## 2022-04-24 DIAGNOSIS — E782 Mixed hyperlipidemia: Secondary | ICD-10-CM

## 2022-04-24 NOTE — Chronic Care Management (AMB) (Signed)
Care Management    RN Visit Note  04/24/2022 Name: [name] MRN: [id number] DOB: 12/09/1940  Subjective: [name] is a 81 y.o. year old female who is a primary care patient of [name], NP. The care management team was consulted for assistance with disease management and care coordination needs.    Engaged with patient by telephone for follow up visit in response to provider referral for case management and/or care coordination services.   Consent to Services:   [name] was given information about Care Management services today including:  Care Management services includes personalized support from designated clinical staff supervised by her physician, including individualized plan of care and coordination with other care providers 24/7 contact phone numbers for assistance for urgent and routine care needs. The patient may stop case management services at any time by phone call to the office staff.  Patient agreed to services and consent obtained.   Assessment: Review of patient past medical history, allergies, medications, health status, including review of consultants reports, laboratory and other test data, was performed as part of comprehensive evaluation and provision of chronic care management services.   SDOH (Social Determinants of Health) assessments and interventions performed:    Care Plan  Allergies  Allergen Reactions   Ciprofloxacin Diarrhea   Codeine     REACTION: nausea and vomiting    Outpatient Encounter Medications as of 04/24/2022  Medication Sig   Cholecalciferol (VITAMIN D) 125 MCG (5000 UT) CAPS Take 125 mcg by mouth. Takes 1 per day   Cranberry-Vitamin C-Vitamin E (CRANBERRY PLUS VITAMIN C) 4200-20-3 MG-MG-UNIT CAPS Take 4,200 mg by mouth. Takes 1 per day   escitalopram (LEXAPRO) 10 MG tablet Take 1 tablet (10 mg total) by mouth daily. For anxiety.   ferrous sulfate 325 (65 FE) MG tablet Take 325 mg by mouth daily with  breakfast.   fluticasone (VERAMYST) 27.5 MCG/SPRAY nasal spray PLACE 1 SPRAY INTO THE NOSE 2 (TWO) TIMES DAILY.   hydrOXYzine (ATARAX/VISTARIL) 10 MG tablet TAKE 1 TABLET BY MOUTH EVERY DAY AS NEEDED FOR ANXIETY   levothyroxine (SYNTHROID) 75 MCG tablet Take 1 tablet by mouth every morning on an empty stomach with water only.  No food or other medications for 30 minutes.   MULTIPLE VITAMIN PO Take by mouth. Patient states she takes 1 per day.   nadolol (CORGARD) 20 MG tablet Take 1 tablet (20 mg total) by mouth daily.   omeprazole (PRILOSEC) 40 MG capsule TAKE 1 CAPSULE (40 MG TOTAL) BY MOUTH 2 (TWO) TIMES DAILY BEFORE A MEAL.   No facility-administered encounter medications on file as of 04/24/2022.    Patient Active Problem List   Diagnosis Date Noted   Esophageal varices without bleeding ([place])    Iron deficiency anemia    Multiple gastric polyps    AVM (arteriovenous malformation) of duodenum, acquired    Gastrointestinal hemorrhage with melena 05/28/2021   Allergic rhinitis 05/02/2019   Acute non-recurrent sinusitis 05/17/2018   Neurodermatitis 05/17/2018   Medicare annual wellness visit, subsequent 02/17/2018   Preventative health care 02/13/2016   Dysuria 08/16/2015   Chronic back pain 07/03/2015   Hepatic steatosis 11/27/2009   COLONIC POLYPS, ADENOMATOUS, HX OF 11/27/2009   Malignant neoplasm of female breast ([place]) 10/21/2007   Hypothyroidism 10/21/2007   Disorder of bone and cartilage 10/21/2007   HLD (hyperlipidemia) 09/17/2007   GAD (generalized anxiety disorder) 09/17/2007   Essential hypertension 09/17/2007   GERD 09/17/2007   IRRITABLE BOWEL SYNDROME  09/17/2007   FIBROMYALGIA 09/17/2007    Conditions to be addressed/monitored: HTN, HLD, and NASH  Care Plan : [facility] plan of care  Updates made by Dannielle Karvonen, RN since 04/24/2022 12:00 AM     Problem: Chronic disease management education and care coordination needs      Long-Range Goal: Development of plan of  care to address chronic disease management and care coordination needs.   Start Date: 12/03/2021  Expected End Date: 05/29/2022  Priority: High  Note:   Goal met. Case closed Current Barriers:  Knowledge Deficits related to plan of care for management of HTN, HLD, and NASH  Patient reports she is doing well.  She reports having a follow up with her gastroenterologist.  She states she was released from service due to being stable.  Patient reports having a follow up visit with her primary care provider on 05/14/22. She states her next appointment with her liver specialist is 06/10/22.  Patient denies any changes with her medications. She states overall she feels she is managing her health conditions.  Reviewed care plan goals and determined goals were met.  Patient verbally agreed to case closure for care management services.    RNCM Clinical Goal(s):  Patient will verbalize understanding of plan for management of HTN, HLD, and NASH as evidenced by patient self report and/ or notation in chart take all medications exactly as prescribed and will call provider for medication related questions as evidenced by patient self report and/ or notation in chart    attend all scheduled medical appointments:   as evidenced by patient self report and/ or notation in chart        continue to work with RN Care Manager and/or Social Worker to address care management and care coordination needs related to HTN, HLD, and NASH as evidenced by adherence to CM Team Scheduled appointments     through collaboration with Consulting civil engineer, provider, and care team.   Interventions: 1:1 collaboration with primary care provider regarding development and update of comprehensive plan of care as evidenced by provider attestation and co-signature Inter-disciplinary care team collaboration (see longitudinal plan of care) Evaluation of current treatment plan related to  self management and patient's adherence to plan as established by  provider  NASH  (Status:  Goal Met.)  Evaluation of current treatment plan related to  NASH ,  self-management and patient's adherence to plan as established by provider. Advised to continue to follow a low salt diet, heart healthy diet Reviewed medication and discussed importance of compliance Reviewed scheduled / upcoming provider appointments.   Hyperlipidemia Interventions:  (Status:  Goal Met.)  Medication review performed; medication list updated in electronic medical record.  Advised to follow up with provider as recommended Advised to follow a heart healthy diet.   Hypertension Interventions:  (Status:  Goal Met.)  Last practice recorded BP readings:  BP Readings from Last 3 Encounters:  04/15/22 (!) 152/72  07/02/21 (!) 146/80  06/05/21 (!) 143/61  Most recent eGFR/CrCl: No results found for: "EGFR"  No components found for: "CRCL"  Evaluation of current treatment plan related to hypertension self management and patient's adherence to plan as established by provider Reviewed medications with patient and discussed importance of compliance Discussed plans with patient for ongoing care management follow up and provided patient with direct contact information for care management team Reviewed scheduled/upcoming provider appointments  Advised to continue to monitor blood pressure 1-3 times per week and record. Advised to continue to  follow a low salt diet.  Advised to call primary care provider office and schedule annual wellness visit  Patient Goals/Self-Care Activities: Continue to take medications as prescribed   Attend all scheduled provider appointments Call pharmacy for medication refills 3-7 days in advance of running out of medications Call provider office for new concerns or questions  - Continue to check blood pressure at least 1 - 3 times per week and record in log/ diary.  - Follow a heart healthy / low salt diet:  Eat low salt and heart healthy meals full of  fruits, vegetables, Whole grains, lean protein and limit fat and sugars.  - Incorporate exercise as tolerated in your routine - Contact you primary care provider office to schedule your annual wellness visit.  - Advised to follow provider recommendation regarding management of NASH condition(continue high protein diet from primary plant based foods, avoid red meat, limit sodium to 2,000 mg/ day, exercise and take multivitamin as instructed by provider)            Plan: No further follow up required: Goals met. Case closed  Quinn Plowman RN,BSN,CCM RN Care Manager Coordinator [place],  [phone]

## 2022-04-24 NOTE — Patient Instructions (Signed)
Visit Information  [place] Congratulations on achieving your goals! It was a pleasure working with you, and I hope you continue to make great strides in improving your health.  Thank you for allowing me to share the care management and care coordination services that are available to you as part of your health plan and services through your primary care provider and medical home. Please reach out to  your primary care provider or me at [phone] if the care management/care coordination team may be of assistance to you in the future.   Quinn Plowman RN,BSN,CCM RN Care Manager Coordinator [place]  [phone]

## 2022-04-24 NOTE — Telephone Encounter (Signed)
  Care Management   Follow Up Note   04/24/2022 Name: [name] MRN: [id number] DOB: 1940-10-31   Referred by: [name], NP Reason for referral : Care Coordination   A second unsuccessful telephone outreach was attempted today. The patient was referred to the case management team for assistance with care management and care coordination.   Follow Up Plan: The care management team will reach out to the patient again over the next 30 days.   Quinn Plowman RN,BSN,CCM RN Care Manager Coordinator [place] [phone]

## 2022-05-05 ENCOUNTER — Telehealth: Payer: Medicare HMO

## 2022-05-14 ENCOUNTER — Encounter: Payer: Self-pay | Admitting: Primary Care

## 2022-05-14 ENCOUNTER — Ambulatory Visit (INDEPENDENT_AMBULATORY_CARE_PROVIDER_SITE_OTHER): Payer: Medicare HMO | Admitting: Primary Care

## 2022-05-14 VITALS — BP 144/72 | HR 54 | Temp 98.6°F | Ht 65.0 in | Wt 180.0 lb

## 2022-05-14 DIAGNOSIS — F411 Generalized anxiety disorder: Secondary | ICD-10-CM

## 2022-05-14 DIAGNOSIS — I10 Essential (primary) hypertension: Secondary | ICD-10-CM | POA: Diagnosis not present

## 2022-05-14 DIAGNOSIS — Z853 Personal history of malignant neoplasm of breast: Secondary | ICD-10-CM | POA: Diagnosis not present

## 2022-05-14 DIAGNOSIS — R188 Other ascites: Secondary | ICD-10-CM | POA: Insufficient documentation

## 2022-05-14 DIAGNOSIS — E038 Other specified hypothyroidism: Secondary | ICD-10-CM | POA: Diagnosis not present

## 2022-05-14 DIAGNOSIS — K219 Gastro-esophageal reflux disease without esophagitis: Secondary | ICD-10-CM | POA: Diagnosis not present

## 2022-05-14 DIAGNOSIS — E785 Hyperlipidemia, unspecified: Secondary | ICD-10-CM

## 2022-05-14 DIAGNOSIS — E2839 Other primary ovarian failure: Secondary | ICD-10-CM | POA: Diagnosis not present

## 2022-05-14 DIAGNOSIS — K7460 Unspecified cirrhosis of liver: Secondary | ICD-10-CM | POA: Diagnosis not present

## 2022-05-14 DIAGNOSIS — Z1231 Encounter for screening mammogram for malignant neoplasm of breast: Secondary | ICD-10-CM

## 2022-05-14 DIAGNOSIS — Z0000 Encounter for general adult medical examination without abnormal findings: Secondary | ICD-10-CM

## 2022-05-14 DIAGNOSIS — I8500 Esophageal varices without bleeding: Secondary | ICD-10-CM | POA: Diagnosis not present

## 2022-05-14 DIAGNOSIS — R69 Illness, unspecified: Secondary | ICD-10-CM | POA: Diagnosis not present

## 2022-05-14 NOTE — Assessment & Plan Note (Signed)
Following with GI, office notes reviewed from July 2023.  Continue omeprazole 40 mg twice daily. Repeat endoscopy next year as scheduled.

## 2022-05-14 NOTE — Progress Notes (Signed)
Subjective:    Patient ID: [name], female    DOB: 12-Sep-1941, 81 y.o.   MRN: [id number]  HPI  [name] is a very pleasant 81 y.o. female who presents today for complete physical and follow up of chronic conditions.  Her daughter joins [place] today.  Immunizations: -Tetanus: 2012 -Influenza: Did not complete last season -Covid-19: No prior vaccines -Shingles: Completed 1 dose of Shingrix -Pneumonia: Prevnar 13 in 2021, Pneumovax 23 2022  Diet: [place].  Exercise: No regular exercise.  Eye exam: Completes annually  Dental exam: Completes semi-annually   Mammogram: Completed in December 2022  Colonoscopy: Completed in 2011.  Dexa: Completed in November 2021  BP Readings from Last 3 Encounters:  05/14/22 (!) 144/72  04/15/22 (!) 152/72  07/02/21 (!) 146/80         Review of Systems  Constitutional:  Negative for unexpected weight change.  HENT:  Negative for rhinorrhea.   Respiratory:  Negative for cough and shortness of breath.   Cardiovascular:  Negative for chest pain.  Gastrointestinal:  Negative for constipation and diarrhea.  Genitourinary:  Negative for difficulty urinating.  Musculoskeletal:  Positive for arthralgias and back pain.  Skin:  Negative for rash.  Allergic/Immunologic: Negative for environmental allergies.  Neurological:  Negative for dizziness and headaches.  Psychiatric/Behavioral:  The patient is not nervous/anxious.          Past Medical History:  Diagnosis Date   Acute non-recurrent sinusitis 05/17/2018   Adenocarcinoma, breast (HCC)    Anxiety    Colonic polyp    5 mm adenoma 1977   Degenerative joint disease    Fibromyalgia    Gastrointestinal hemorrhage with melena 05/28/2021   GERD (gastroesophageal reflux disease)    Hemorrhoids    History of anemia    History of shingles    Hyperlipidemia    Hypertension    Hypothyroidism    IBS (irritable bowel syndrome)    NASH (nonalcoholic steatohepatitis)     Osteopenia    Personal history of chemotherapy 2005   Left Breast Cancer   Personal history of radiation therapy 2005   Left Breast Cancer   UTI (urinary tract infection) 03/01/2015   Wears glasses     Social History   Socioeconomic History   Marital status: Married    Spouse name: [name]   Number of children: 2   Years of education: Not on file   Highest education level: Not on file  Occupational History   Occupation: retired  Tobacco Use   Smoking status: Former    Packs/day: 0.20    Years: 7.00    Total pack years: 1.40    Types: Cigarettes    Quit date: 09/29/1970    Years since quitting: 51.6   Smokeless tobacco: Never   Tobacco comments:    1 pack per week  Vaping Use   Vaping Use: Never used  Substance and Sexual Activity   Alcohol use: Not Currently   Drug use: No   Sexual activity: Yes  Other Topics Concern   Not on file  Social History Narrative   Would desire CPR   Social Determinants of Health   Financial Resource Strain: Not on file  Food Insecurity: No Food Insecurity (05/09/2021)   Hunger Vital Sign    Worried About Running Out of Food in the Last Year: Never true    Ran Out of Food in the Last Year: Never true  Transportation Needs: No Transportation Needs (05/09/2021)  PRAPARE - Hydrologist (Medical): No    Lack of Transportation (Non-Medical): No  Physical Activity: Not on file  Stress: Not on file  Social Connections: Not on file  Intimate Partner Violence: Not on file    Past Surgical History:  Procedure Laterality Date   ABDOMINAL HYSTERECTOMY     APPENDECTOMY     BILATERAL SALPINGOOPHORECTOMY  1978   BREAST LUMPECTOMY Left 2005   left--node dissection 7/05 by Dr. Charlynne Pander   ESOPHAGOGASTRODUODENOSCOPY (EGD) WITH PROPOFOL N/A 06/05/2021   Procedure: ESOPHAGOGASTRODUODENOSCOPY (EGD) WITH PROPOFOL;  Surgeon: Lavena Bullion, DO;  Location: [facility];  Service: Gastroenterology;  Laterality: N/A;    HEMOSTASIS CLIP PLACEMENT  06/05/2021   Procedure: HEMOSTASIS CLIP PLACEMENT;  Surgeon: Lavena Bullion, DO;  Location: [facility];  Service: Gastroenterology;;   HOT HEMOSTASIS N/A 06/05/2021   Procedure: HOT HEMOSTASIS (ARGON PLASMA COAGULATION/BICAP);  Surgeon: Lavena Bullion, DO;  Location: [facility];  Service: Gastroenterology;  Laterality: N/A;   LAPAROSCOPIC CHOLECYSTECTOMY     POLYPECTOMY  06/05/2021   Procedure: POLYPECTOMY;  Surgeon: Lavena Bullion, DO;  Location: [facility];  Service: Gastroenterology;;   ROTATOR CUFF REPAIR     left    Family History  Problem Relation Age of Onset   Stroke Mother    Prostate cancer Father    Emphysema Sister    Crohn's disease Sister    Prostate cancer Brother    Aneurysm Brother    Liver disease Brother    Hypertension Brother     Allergies  Allergen Reactions   Ciprofloxacin Diarrhea   Codeine     REACTION: nausea and vomiting    Current Outpatient Medications on File Prior to Visit  Medication Sig Dispense Refill   Cholecalciferol (VITAMIN D) 125 MCG (5000 UT) CAPS Take 125 mcg by mouth. Takes 1 per day     Cranberry-Vitamin C-Vitamin E (CRANBERRY PLUS VITAMIN C) 4200-20-3 MG-MG-UNIT CAPS Take 4,200 mg by mouth. Takes 1 per day     escitalopram (LEXAPRO) 10 MG tablet Take 1 tablet (10 mg total) by mouth daily. For anxiety. 90 tablet 3   ferrous sulfate 325 (65 FE) MG tablet Take 325 mg by mouth daily with breakfast.     levothyroxine (SYNTHROID) 75 MCG tablet Take 1 tablet by mouth every morning on an empty stomach with water only.  No food or other medications for 30 minutes. 90 tablet 3   MULTIPLE VITAMIN PO Take by mouth. Patient states she takes 1 per day.     nadolol (CORGARD) 20 MG tablet Take 1 tablet (20 mg total) by mouth daily. 90 tablet 3   omeprazole (PRILOSEC) 40 MG capsule TAKE 1 CAPSULE (40 MG TOTAL) BY MOUTH 2 (TWO) TIMES DAILY BEFORE A MEAL. 180 capsule 1   fluticasone (VERAMYST) 27.5 MCG/SPRAY nasal  spray PLACE 1 SPRAY INTO THE NOSE 2 (TWO) TIMES DAILY. (Patient not taking: Reported on 05/14/2022) 5.9 mL 3   hydrOXYzine (ATARAX/VISTARIL) 10 MG tablet TAKE 1 TABLET BY MOUTH EVERY DAY AS NEEDED FOR ANXIETY (Patient not taking: Reported on 05/14/2022) 90 tablet 0   No current facility-administered medications on file prior to visit.    BP (!) 144/72   Pulse (!) 54   Temp 98.6 F (37 C) (Oral)   Ht 5' 5"  (1.651 m)   Wt 180 lb (81.6 kg)   SpO2 98%   BMI 29.95 kg/m  Objective:   Physical Exam HENT:  Right Ear: Tympanic membrane and ear canal normal.     Left Ear: Tympanic membrane and ear canal normal.     Nose: Nose normal.  Eyes:     Conjunctiva/sclera: Conjunctivae normal.     Pupils: Pupils are equal, round, and reactive to light.  Neck:     Thyroid: No thyromegaly.  Cardiovascular:     Rate and Rhythm: Normal rate and regular rhythm.     Heart sounds: No murmur heard. Pulmonary:     Effort: Pulmonary effort is normal.     Breath sounds: Normal breath sounds. No rales.  Abdominal:     General: Bowel sounds are normal.     Palpations: Abdomen is soft.     Tenderness: There is no abdominal tenderness.  Musculoskeletal:     Cervical back: Neck supple.     Comments: Generalized reduced ROM to lower back. Ambulates with cane  Lymphadenopathy:     Cervical: No cervical adenopathy.  Skin:    General: Skin is warm and dry.     Findings: No rash.  Neurological:     Mental Status: She is alert and oriented to person, place, and time.     Cranial Nerves: No cranial nerve deficit.     Deep Tendon Reflexes: Reflexes are normal and symmetric.  Psychiatric:        Mood and Affect: Mood normal.           Assessment & Plan:   Problem List Items Addressed This Visit       Cardiovascular and Mediastinum   Essential hypertension    Controlled.  Continue nadolol 20 mg daily. ACE induced cough has resolved with elimination of lisinopril.      Relevant Orders    CBC   Esophageal varices without bleeding [facility])    Following with GI, office notes reviewed from July 2023.  Continue omeprazole 40 mg twice daily. Repeat endoscopy next year as scheduled.        Digestive   GERD    Controlled.  Continue omeprazole 40 mg twice daily.      Cirrhosis of liver (HCC)    Nonalcoholic. Following with GI, office notes reviewed from July 2023. Following with liver clinic, office notes and labs reviewed from June 2023.         Endocrine   Hypothyroidism    She is taking levothyroxine correctly.  Continue levothyroxine 75 mcg daily. Repeat TSH pending.      Relevant Orders   TSH     Other   History of breast cancer    Mammogram up-to-date. Repeat diagnostic mammogram in December 2023.      HLD (hyperlipidemia)    Repeat lipid panel pending.  Not currently on treatment.      Relevant Orders   Lipid panel   GAD (generalized anxiety disorder)    Controlled.  Continue Lexapro 10 mg daily.      Preventative health care - Primary    Discussed to complete second Shingrix vaccine at the pharmacy.  Pneumonia vaccines up-to-date. Mammogram up-to-date. Bone density scan due this December, discussed with patient. Colon cancer screening not applicable given age.  Discussed the importance of a healthy diet and regular exercise in order for weight loss, and to reduce the risk of further co-morbidity.  Exam stable. Labs pending and reviewed.  Follow up in 1 year for repeat physical.       Other Visit Diagnoses     Encounter for screening mammogram for malignant neoplasm  of breast       Relevant Orders   MM DIAG BREAST TOMO BILATERAL   Estrogen deficiency       Relevant Orders   DG Bone Density          [name], NP

## 2022-05-14 NOTE — Assessment & Plan Note (Signed)
Mammogram up-to-date. Repeat diagnostic mammogram in December 2023.

## 2022-05-14 NOTE — Assessment & Plan Note (Signed)
Repeat lipid panel pending.  Not currently on treatment.

## 2022-05-14 NOTE — Assessment & Plan Note (Signed)
Controlled.  Continue nadolol 20 mg daily. ACE induced cough has resolved with elimination of lisinopril.

## 2022-05-14 NOTE — Assessment & Plan Note (Signed)
Controlled.  Continue omeprazole 40 mg twice daily.

## 2022-05-14 NOTE — Assessment & Plan Note (Signed)
Nonalcoholic. Following with GI, office notes reviewed from July 2023. Following with liver clinic, office notes and labs reviewed from June 2023.

## 2022-05-14 NOTE — Patient Instructions (Signed)
Call the [facility] to schedule your mammogram and bone density scan for December 2023.  Stop by the lab prior to leaving today. I will notify you of your results once received.   It was a pleasure to see you today!  Preventive Care 55 Years and Older, Female Preventive care refers to lifestyle choices and visits with your health care provider that can promote health and wellness. Preventive care visits are also called wellness exams. What can I expect for my preventive care visit? Counseling Your health care provider may ask you questions about your: Medical history, including: Past medical problems. Family medical history. Pregnancy and menstrual history. History of falls. Current health, including: Memory and ability to understand (cognition). Emotional well-being. Home life and relationship well-being. Sexual activity and sexual health. Lifestyle, including: Alcohol, nicotine or tobacco, and drug use. Access to firearms. Diet, exercise, and sleep habits. Work and work Statistician. Sunscreen use. Safety issues such as seatbelt and bike helmet use. Physical exam Your health care provider will check your: Height and weight. These may be used to calculate your BMI (body mass index). BMI is a measurement that tells if you are at a healthy weight. Waist circumference. This measures the distance around your waistline. This measurement also tells if you are at a healthy weight and may help predict your risk of certain diseases, such as type 2 diabetes and high blood pressure. Heart rate and blood pressure. Body temperature. Skin for abnormal spots. What immunizations do I need?  Vaccines are usually given at various ages, according to a schedule. Your health care provider will recommend vaccines for you based on your age, medical history, and lifestyle or other factors, such as travel or where you work. What tests do I need? Screening Your health care provider may recommend  screening tests for certain conditions. This may include: Lipid and cholesterol levels. Hepatitis C test. Hepatitis B test. HIV (human immunodeficiency virus) test. STI (sexually transmitted infection) testing, if you are at risk. Lung cancer screening. Colorectal cancer screening. Diabetes screening. This is done by checking your blood sugar (glucose) after you have not eaten for a while (fasting). Mammogram. Talk with your health care provider about how often you should have regular mammograms. BRCA-related cancer screening. This may be done if you have a family history of breast, ovarian, tubal, or peritoneal cancers. Bone density scan. This is done to screen for osteoporosis. Talk with your health care provider about your test results, treatment options, and if necessary, the need for more tests. Follow these instructions at home: Eating and drinking  Eat a diet that includes fresh fruits and vegetables, whole grains, lean protein, and low-fat dairy products. Limit your intake of foods with high amounts of sugar, saturated fats, and salt. Take vitamin and mineral supplements as recommended by your health care provider. Do not drink alcohol if your health care provider tells you not to drink. If you drink alcohol: Limit how much you have to 0-1 drink a day. Know how much alcohol is in your drink. In the [place] one drink equals one 12 oz bottle of beer (355 mL), one 5 oz glass of wine (148 mL), or one 1 oz glass of hard liquor (44 mL). Lifestyle Brush your teeth every morning and night with fluoride toothpaste. Floss one time each day. Exercise for at least 30 minutes 5 or more days each week. Do not use any products that contain nicotine or tobacco. These products include cigarettes, chewing tobacco, and vaping devices,  such as e-cigarettes. If you need help quitting, ask your health care provider. Do not use drugs. If you are sexually active, practice safe sex. Use a condom or other  form of protection in order to prevent STIs. Take aspirin only as told by your health care provider. Make sure that you understand how much to take and what form to take. Work with your health care provider to find out whether it is safe and beneficial for you to take aspirin daily. Ask your health care provider if you need to take a cholesterol-lowering medicine (statin). Find healthy ways to manage stress, such as: Meditation, yoga, or listening to music. Journaling. Talking to a trusted person. Spending time with friends and family. Minimize exposure to UV radiation to reduce your risk of skin cancer. Safety Always wear your seat belt while driving or riding in a vehicle. Do not drive: If you have been drinking alcohol. Do not ride with someone who has been drinking. When you are tired or distracted. While texting. If you have been using any mind-altering substances or drugs. Wear a helmet and other protective equipment during sports activities. If you have firearms in your house, make sure you follow all gun safety procedures. What's next? Visit your health care provider once a year for an annual wellness visit. Ask your health care provider how often you should have your eyes and teeth checked. Stay up to date on all vaccines. This information is not intended to replace advice given to you by your health care provider. Make sure you discuss any questions you have with your health care provider. Document Revised: 03/13/2021 Document Reviewed: 03/13/2021 Elsevier Patient Education  [place].

## 2022-05-14 NOTE — Assessment & Plan Note (Signed)
She is taking levothyroxine correctly.  Continue levothyroxine 75 mcg daily. Repeat TSH pending.

## 2022-05-14 NOTE — Assessment & Plan Note (Signed)
Discussed to complete second Shingrix vaccine at the pharmacy.  Pneumonia vaccines up-to-date. Mammogram up-to-date. Bone density scan due this December, discussed with patient. Colon cancer screening not applicable given age.  Discussed the importance of a healthy diet and regular exercise in order for weight loss, and to reduce the risk of further co-morbidity.  Exam stable. Labs pending and reviewed.  Follow up in 1 year for repeat physical.

## 2022-05-14 NOTE — Assessment & Plan Note (Signed)
Controlled.  Continue Lexapro 10 mg daily.

## 2022-05-15 ENCOUNTER — Other Ambulatory Visit (INDEPENDENT_AMBULATORY_CARE_PROVIDER_SITE_OTHER): Payer: Medicare HMO

## 2022-05-15 ENCOUNTER — Other Ambulatory Visit: Payer: Self-pay | Admitting: Primary Care

## 2022-05-15 DIAGNOSIS — E038 Other specified hypothyroidism: Secondary | ICD-10-CM

## 2022-05-15 DIAGNOSIS — Z1231 Encounter for screening mammogram for malignant neoplasm of breast: Secondary | ICD-10-CM

## 2022-05-15 LAB — CBC
HCT: 42.3 % (ref 36.0–46.0)
Hemoglobin: 14.1 g/dL (ref 12.0–15.0)
MCHC: 33.4 g/dL (ref 30.0–36.0)
MCV: 91.8 fl (ref 78.0–100.0)
Platelets: 119 10*3/uL — ABNORMAL LOW (ref 150.0–400.0)
RBC: 4.61 Mil/uL (ref 3.87–5.11)
RDW: 15.2 % (ref 11.5–15.5)
WBC: 6.2 10*3/uL (ref 4.0–10.5)

## 2022-05-15 LAB — LIPID PANEL
Cholesterol: 187 mg/dL (ref 0–200)
HDL: 51.9 mg/dL (ref >39.00)
LDL Cholesterol: 105 mg/dL — ABNORMAL HIGH (ref 0–99)
NonHDL: 134.95
Total CHOL/HDL Ratio: 4
Triglycerides: 148 mg/dL (ref 0.0–149.0)
VLDL: 29.6 mg/dL (ref 0.0–40.0)

## 2022-05-15 LAB — TSH: TSH: 3.33 u[IU]/mL (ref 0.35–5.50)

## 2022-05-15 NOTE — Addendum Note (Signed)
Addended by: Ellamae Sia on: 05/15/2022 07:32 AM   Modules accepted: Orders

## 2022-05-27 ENCOUNTER — Other Ambulatory Visit: Payer: Self-pay

## 2022-05-27 MED ORDER — NADOLOL 20 MG PO TABS: 20.0000 mg | ORAL_TABLET | Freq: Every day | ORAL | 3 refills | Status: DC

## 2022-06-16 ENCOUNTER — Other Ambulatory Visit: Payer: Self-pay | Admitting: Nurse Practitioner

## 2022-06-16 DIAGNOSIS — K7460 Unspecified cirrhosis of liver: Secondary | ICD-10-CM

## 2022-06-16 DIAGNOSIS — D376 Neoplasm of uncertain behavior of liver, gallbladder and bile ducts: Secondary | ICD-10-CM | POA: Diagnosis not present

## 2022-06-16 DIAGNOSIS — R188 Other ascites: Secondary | ICD-10-CM | POA: Diagnosis not present

## 2022-06-16 DIAGNOSIS — K7581 Nonalcoholic steatohepatitis (NASH): Secondary | ICD-10-CM | POA: Diagnosis not present

## 2022-06-16 DIAGNOSIS — I8510 Secondary esophageal varices without bleeding: Secondary | ICD-10-CM | POA: Diagnosis not present

## 2022-06-17 ENCOUNTER — Other Ambulatory Visit: Payer: Self-pay | Admitting: Primary Care

## 2022-06-17 DIAGNOSIS — E039 Hypothyroidism, unspecified: Secondary | ICD-10-CM

## 2022-06-24 DIAGNOSIS — I10 Essential (primary) hypertension: Secondary | ICD-10-CM

## 2022-06-25 MED ORDER — LOSARTAN POTASSIUM 50 MG PO TABS: 50.0000 mg | ORAL_TABLET | Freq: Every day | ORAL | 0 refills | Status: DC

## 2022-07-08 ENCOUNTER — Ambulatory Visit
Admission: RE | Admit: 2022-07-08 | Discharge: 2022-07-08 | Disposition: A | Discharge status: Home / Self Care | Payer: Medicare HMO | Source: Ambulatory Visit | Attending: Nurse Practitioner | Admitting: Nurse Practitioner

## 2022-07-08 DIAGNOSIS — K7460 Unspecified cirrhosis of liver: Secondary | ICD-10-CM

## 2022-07-08 DIAGNOSIS — N281 Cyst of kidney, acquired: Secondary | ICD-10-CM | POA: Diagnosis not present

## 2022-07-08 DIAGNOSIS — K862 Cyst of pancreas: Secondary | ICD-10-CM | POA: Diagnosis not present

## 2022-07-08 DIAGNOSIS — K449 Diaphragmatic hernia without obstruction or gangrene: Secondary | ICD-10-CM | POA: Diagnosis not present

## 2022-07-08 MED ORDER — IOPAMIDOL (ISOVUE-300) INJECTION 61%
100.0000 mL | Freq: Once | INTRAVENOUS | Status: AC | PRN
  Administered 2022-07-08: 100 mL via INTRAVENOUS

## 2022-07-10 ENCOUNTER — Ambulatory Visit (INDEPENDENT_AMBULATORY_CARE_PROVIDER_SITE_OTHER): Payer: Medicare HMO | Admitting: Primary Care

## 2022-07-10 ENCOUNTER — Encounter: Payer: Self-pay | Admitting: Primary Care

## 2022-07-10 VITALS — BP 128/78 | HR 67 | Temp 97.7°F | Ht 65.0 in | Wt 177.0 lb

## 2022-07-10 DIAGNOSIS — I10 Essential (primary) hypertension: Secondary | ICD-10-CM | POA: Diagnosis not present

## 2022-07-10 DIAGNOSIS — F411 Generalized anxiety disorder: Secondary | ICD-10-CM | POA: Diagnosis not present

## 2022-07-10 DIAGNOSIS — R69 Illness, unspecified: Secondary | ICD-10-CM | POA: Diagnosis not present

## 2022-07-10 MED ORDER — ESCITALOPRAM OXALATE 10 MG PO TABS: 10.0000 mg | ORAL_TABLET | Freq: Every day | ORAL | 2 refills | Status: DC

## 2022-07-10 MED ORDER — LOSARTAN POTASSIUM 50 MG PO TABS: 50.0000 mg | ORAL_TABLET | Freq: Every day | ORAL | 2 refills | Status: DC

## 2022-07-10 NOTE — Progress Notes (Signed)
Subjective:    Patient ID: [name], female    DOB: 1940-11-25, 81 y.o.   MRN: [id number]  HPI  [name] is a very pleasant 81 y.o. female with a history of hypertension, esophageal varices without bleeding, AVM, hypothyroidism, fibromyalgia, GAD who presents today for follow up of hypertension.   She was last evaluated by me on 05/14/2022 for her routine follow-up.  During this visit her GI provider had changed her lisinopril to nadolol.  Her daughter reached out to [place] via MyChart a few weeks ago reporting elevated blood pressure readings at home running 150-180/80's-90's.   She is checking BP at home which is running 140's/70's-80's. She denies headaches, dizziness, cough, chest pain. Overall she's feeling very well.   She is taking her losartan 50 mg daily.  BP Readings from Last 3 Encounters:  07/10/22 128/78  05/14/22 (!) 144/72  04/15/22 (!) 152/72      Review of Systems  Respiratory:  Negative for shortness of breath.   Cardiovascular:  Negative for chest pain.  Neurological:  Negative for dizziness and headaches.         Past Medical History:  Diagnosis Date   Acute non-recurrent sinusitis 05/17/2018   Adenocarcinoma, breast ([place])    Anxiety    Colonic polyp    5 mm adenoma 1977   Degenerative joint disease    Fibromyalgia    Gastrointestinal hemorrhage with melena 05/28/2021   GERD (gastroesophageal reflux disease)    Hemorrhoids    History of anemia    History of shingles    Hyperlipidemia    Hypertension    Hypothyroidism    IBS (irritable bowel syndrome)    NASH (nonalcoholic steatohepatitis)    Osteopenia    Personal history of chemotherapy 2005   Left Breast Cancer   Personal history of radiation therapy 2005   Left Breast Cancer   UTI (urinary tract infection) 03/01/2015   Wears glasses     Social History   Socioeconomic History   Marital status: Married    Spouse name: [name]   Number of children: 2   Years of  education: Not on file   Highest education level: Not on file  Occupational History   Occupation: retired  Tobacco Use   Smoking status: Former    Packs/day: 0.20    Years: 7.00    Total pack years: 1.40    Types: Cigarettes    Quit date: 09/29/1970    Years since quitting: 51.8   Smokeless tobacco: Never   Tobacco comments:    1 pack per week  Vaping Use   Vaping Use: Never used  Substance and Sexual Activity   Alcohol use: Not Currently   Drug use: No   Sexual activity: Yes  Other Topics Concern   Not on file  Social History Narrative   Would desire CPR   Social Determinants of Health   Financial Resource Strain: Not on file  Food Insecurity: No Food Insecurity (05/09/2021)   Hunger Vital Sign    Worried About Running Out of Food in the Last Year: Never true    Ran Out of Food in the Last Year: Never true  Transportation Needs: No Transportation Needs (05/09/2021)   PRAPARE - Hydrologist (Medical): No    Lack of Transportation (Non-Medical): No  Physical Activity: Not on file  Stress: Not on file  Social Connections: Not on file  Intimate Partner Violence: Not on file  Past Surgical History:  Procedure Laterality Date   ABDOMINAL HYSTERECTOMY     APPENDECTOMY     BILATERAL SALPINGOOPHORECTOMY  1978   BREAST LUMPECTOMY Left 2005   left--node dissection 7/05 by Dr. Charlynne Pander   ESOPHAGOGASTRODUODENOSCOPY (EGD) WITH PROPOFOL N/A 06/05/2021   Procedure: ESOPHAGOGASTRODUODENOSCOPY (EGD) WITH PROPOFOL;  Surgeon: Lavena Bullion, DO;  Location: [facility];  Service: Gastroenterology;  Laterality: N/A;   HEMOSTASIS CLIP PLACEMENT  06/05/2021   Procedure: HEMOSTASIS CLIP PLACEMENT;  Surgeon: Lavena Bullion, DO;  Location: [facility];  Service: Gastroenterology;;   HOT HEMOSTASIS N/A 06/05/2021   Procedure: HOT HEMOSTASIS (ARGON PLASMA COAGULATION/BICAP);  Surgeon: Lavena Bullion, DO;  Location: [facility];  Service: Gastroenterology;   Laterality: N/A;   LAPAROSCOPIC CHOLECYSTECTOMY     POLYPECTOMY  06/05/2021   Procedure: POLYPECTOMY;  Surgeon: Lavena Bullion, DO;  Location: [facility];  Service: Gastroenterology;;   ROTATOR CUFF REPAIR     left    Family History  Problem Relation Age of Onset   Stroke Mother    Prostate cancer Father    Emphysema Sister    Crohn's disease Sister    Prostate cancer Brother    Aneurysm Brother    Liver disease Brother    Hypertension Brother     Allergies  Allergen Reactions   Ciprofloxacin Diarrhea   Codeine     REACTION: nausea and vomiting    Current Outpatient Medications on File Prior to Visit  Medication Sig Dispense Refill   Cholecalciferol (VITAMIN D) 125 MCG (5000 UT) CAPS Take 125 mcg by mouth. Takes 1 per day     Cranberry-Vitamin C-Vitamin E (CRANBERRY PLUS VITAMIN C) 4200-20-3 MG-MG-UNIT CAPS Take 4,200 mg by mouth. Takes 1 per day     ferrous sulfate 325 (65 FE) MG tablet Take 325 mg by mouth daily with breakfast.     hydrOXYzine (ATARAX/VISTARIL) 10 MG tablet TAKE 1 TABLET BY MOUTH EVERY DAY AS NEEDED FOR ANXIETY 90 tablet 0   levothyroxine (SYNTHROID) 75 MCG tablet TAKE 1 TABLET BY MOUTH EVERY MORNING ON AN EMPTY STOMACH WITH WATER ONLY. NO FOOD OR OTHER MEDICATIONS FOR 30 MINUTES. 90 tablet 3   MULTIPLE VITAMIN PO Take by mouth. Patient states she takes 1 per day.     omeprazole (PRILOSEC) 40 MG capsule TAKE 1 CAPSULE (40 MG TOTAL) BY MOUTH 2 (TWO) TIMES DAILY BEFORE A MEAL. 180 capsule 1   fluticasone (VERAMYST) 27.5 MCG/SPRAY nasal spray PLACE 1 SPRAY INTO THE NOSE 2 (TWO) TIMES DAILY. (Patient not taking: Reported on 05/14/2022) 5.9 mL 3   No current facility-administered medications on file prior to visit.    BP 128/78   Pulse 67   Temp 97.7 F (36.5 C) (Temporal)   Ht 5' 5"  (1.651 m)   Wt 177 lb (80.3 kg)   SpO2 98%   BMI 29.45 kg/m  Objective:   Physical Exam Cardiovascular:     Rate and Rhythm: Normal rate and regular rhythm.   Pulmonary:     Effort: Pulmonary effort is normal.     Breath sounds: Normal breath sounds.  Musculoskeletal:     Cervical back: Neck supple.  Skin:    General: Skin is warm and dry.           Assessment & Plan:   Problem List Items Addressed This Visit       Cardiovascular and Mediastinum   Essential hypertension - Primary    Controlled. Blood pressure at goal today.  Continue Losartan 50 mg daily. Refills sent to pharmacy.  I evaluated patient, was consulted regarding treatment, and agree with assessment and plan per Tinnie Gens, RN, DNP student.   Allie Bossier, NP-C        Relevant Medications   losartan (COZAAR) 50 MG tablet     Other   GAD (generalized anxiety disorder)   Relevant Medications   escitalopram (LEXAPRO) 10 MG tablet       [name], NP

## 2022-07-10 NOTE — Progress Notes (Signed)
Established Patient Office Visit  Subjective   Patient ID: [name], female    DOB: May 12, 1941  Age: 81 y.o. MRN: [id number]  Chief Complaint  Patient presents with   Medication Management    Blood Pressure medication     HPI  [name] is a 81 year old female with past medical history of hypertension, hypothyroidism, fibromyalgia, history of breast cancer, hyperlipidemia presents today for a follow up.   1) Hypertension: Patient was started on Nadolol by the GI doctor in July. Her daughter sent a mychart message regarding her home readings of BP between 283-151 systolic and 76H-60V diastolic.   She started taking Losartan on 06/26/22 and reports that she is doing much better. Denies any cough, blurred vision, headaches or dizziness. She does check BP at home and readings are usually between low 140s to mid 371G systolic and 62I-94W diastolic.   She does have intermittent lower extremity edema when her diet includes foods high in sodium. She notices that the swelling is there once or twice a week.   Patient Active Problem List   Diagnosis Date Noted   Cirrhosis of liver ([place]) 05/14/2022   Esophageal varices without bleeding ([facility])    Iron deficiency anemia    Multiple gastric polyps    AVM (arteriovenous malformation) of duodenum, acquired    Allergic rhinitis 05/02/2019   Neurodermatitis 05/17/2018   Medicare annual wellness visit, subsequent 02/17/2018   Preventative health care 02/13/2016   Dysuria 08/16/2015   Chronic back pain 07/03/2015   Hepatic steatosis 11/27/2009   COLONIC POLYPS, ADENOMATOUS, HX OF 11/27/2009   History of breast cancer 10/21/2007   Hypothyroidism 10/21/2007   Disorder of bone and cartilage 10/21/2007   HLD (hyperlipidemia) 09/17/2007   GAD (generalized anxiety disorder) 09/17/2007   Essential hypertension 09/17/2007   GERD 09/17/2007   IRRITABLE BOWEL SYNDROME 09/17/2007   FIBROMYALGIA 09/17/2007   Past Medical History:   Diagnosis Date   Acute non-recurrent sinusitis 05/17/2018   Adenocarcinoma, breast ([facility])    Anxiety    Colonic polyp    5 mm adenoma 1977   Degenerative joint disease    Fibromyalgia    Gastrointestinal hemorrhage with melena 05/28/2021   GERD (gastroesophageal reflux disease)    Hemorrhoids    History of anemia    History of shingles    Hyperlipidemia    Hypertension    Hypothyroidism    IBS (irritable bowel syndrome)    NASH (nonalcoholic steatohepatitis)    Osteopenia    Personal history of chemotherapy 2005   Left Breast Cancer   Personal history of radiation therapy 2005   Left Breast Cancer   UTI (urinary tract infection) 03/01/2015   Wears glasses    Past Surgical History:  Procedure Laterality Date   ABDOMINAL HYSTERECTOMY     APPENDECTOMY     BILATERAL SALPINGOOPHORECTOMY  1978   BREAST LUMPECTOMY Left 2005   left--node dissection 7/05 by Dr. Charlynne Pander   ESOPHAGOGASTRODUODENOSCOPY (EGD) WITH PROPOFOL N/A 06/05/2021   Procedure: ESOPHAGOGASTRODUODENOSCOPY (EGD) WITH PROPOFOL;  Surgeon: Lavena Bullion, DO;  Location: [facility];  Service: Gastroenterology;  Laterality: N/A;   HEMOSTASIS CLIP PLACEMENT  06/05/2021   Procedure: HEMOSTASIS CLIP PLACEMENT;  Surgeon: Lavena Bullion, DO;  Location: [facility];  Service: Gastroenterology;;   HOT HEMOSTASIS N/A 06/05/2021   Procedure: HOT HEMOSTASIS (ARGON PLASMA COAGULATION/BICAP);  Surgeon: Lavena Bullion, DO;  Location: [facility];  Service: Gastroenterology;  Laterality: N/A;   LAPAROSCOPIC CHOLECYSTECTOMY  POLYPECTOMY  06/05/2021   Procedure: POLYPECTOMY;  Surgeon: Lavena Bullion, DO;  Location: [facility];  Service: Gastroenterology;;   ROTATOR CUFF REPAIR     left   Social History   Tobacco Use   Smoking status: Former    Packs/day: 0.20    Years: 7.00    Total pack years: 1.40    Types: Cigarettes    Quit date: 09/29/1970    Years since quitting: 51.8   Smokeless tobacco: Never   Tobacco  comments:    1 pack per week  Vaping Use   Vaping Use: Never used  Substance Use Topics   Alcohol use: Not Currently   Drug use: No   Family History  Problem Relation Age of Onset   Stroke Mother    Prostate cancer Father    Emphysema Sister    Crohn's disease Sister    Prostate cancer Brother    Aneurysm Brother    Liver disease Brother    Hypertension Brother    Allergies  Allergen Reactions   Ciprofloxacin Diarrhea   Codeine     REACTION: nausea and vomiting      Review of Systems  Eyes:  Negative for blurred vision.  Respiratory:  Negative for cough and shortness of breath.   Cardiovascular:  Negative for chest pain.  Genitourinary:  Negative for dysuria.      Objective:     BP 128/78   Pulse 67   Temp 97.7 F (36.5 C) (Temporal)   Ht 5' 5"  (1.651 m)   Wt 177 lb (80.3 kg)   SpO2 98%   BMI 29.45 kg/m  BP Readings from Last 3 Encounters:  07/10/22 128/78  05/14/22 (!) 144/72  04/15/22 (!) 152/72   Wt Readings from Last 3 Encounters:  07/10/22 177 lb (80.3 kg)  05/14/22 180 lb (81.6 kg)  04/15/22 174 lb (78.9 kg)      Physical Exam Cardiovascular:     Rate and Rhythm: Normal rate and regular rhythm.     Pulses: Normal pulses.     Heart sounds: Normal heart sounds.  Pulmonary:     Effort: Pulmonary effort is normal.     Breath sounds: Normal breath sounds.  Musculoskeletal:        General: No swelling.  Neurological:     Mental Status: She is alert and oriented to person, place, and time.      No results found for any visits on 07/10/22.     The ASCVD Risk score (Arnett DK, et al., 2019) failed to calculate for the following reasons:   The 2019 ASCVD risk score is only valid for ages 57 to 68    Assessment & Plan:   Problem List Items Addressed This Visit       Cardiovascular and Mediastinum   Essential hypertension    Controlled. Blood pressure at goal today.   Continue Losartan 50 mg daily. Refills sent.        Relevant  Medications   losartan (COZAAR) 50 MG tablet     Other   GAD (generalized anxiety disorder)   Relevant Medications   escitalopram (LEXAPRO) 10 MG tablet    No follow-ups on file.   Tinnie Gens, BSN-RN, DNP STUDENT

## 2022-07-10 NOTE — Patient Instructions (Addendum)
Continue Losartan 50 mg daily.   Follow up in August.  It was a pleasure to see you today!

## 2022-07-10 NOTE — Assessment & Plan Note (Addendum)
Controlled. Blood pressure at goal today.   Continue Losartan 50 mg daily. Refills sent to pharmacy.  I evaluated patient, was consulted regarding treatment, and agree with assessment and plan per Tinnie Gens, RN, DNP student.   Allie Bossier, NP-C

## 2022-07-21 ENCOUNTER — Other Ambulatory Visit: Payer: Self-pay | Admitting: Primary Care

## 2022-07-21 DIAGNOSIS — K219 Gastro-esophageal reflux disease without esophagitis: Secondary | ICD-10-CM

## 2022-07-24 DIAGNOSIS — Z87892 Personal history of anaphylaxis: Secondary | ICD-10-CM | POA: Diagnosis not present

## 2022-07-24 DIAGNOSIS — E039 Hypothyroidism, unspecified: Secondary | ICD-10-CM | POA: Diagnosis not present

## 2022-07-24 DIAGNOSIS — F411 Generalized anxiety disorder: Secondary | ICD-10-CM | POA: Diagnosis not present

## 2022-07-24 DIAGNOSIS — Z853 Personal history of malignant neoplasm of breast: Secondary | ICD-10-CM | POA: Diagnosis not present

## 2022-07-24 DIAGNOSIS — Z6830 Body mass index (BMI) 30.0-30.9, adult: Secondary | ICD-10-CM | POA: Diagnosis not present

## 2022-07-24 DIAGNOSIS — Z87891 Personal history of nicotine dependence: Secondary | ICD-10-CM | POA: Diagnosis not present

## 2022-07-24 DIAGNOSIS — R69 Illness, unspecified: Secondary | ICD-10-CM | POA: Diagnosis not present

## 2022-07-24 DIAGNOSIS — E669 Obesity, unspecified: Secondary | ICD-10-CM | POA: Diagnosis not present

## 2022-07-24 DIAGNOSIS — Z882 Allergy status to sulfonamides status: Secondary | ICD-10-CM | POA: Diagnosis not present

## 2022-07-24 DIAGNOSIS — I10 Essential (primary) hypertension: Secondary | ICD-10-CM | POA: Diagnosis not present

## 2022-07-24 DIAGNOSIS — K219 Gastro-esophageal reflux disease without esophagitis: Secondary | ICD-10-CM | POA: Diagnosis not present

## 2022-07-29 ENCOUNTER — Telehealth: Payer: Self-pay | Admitting: Primary Care

## 2022-07-29 NOTE — Telephone Encounter (Signed)
Left message for patient to call back and schedule Medicare Annual Wellness Visit (AWV).   Please offer to do virtually or by telephone.   Last AWV: 03/07/2019  Please schedule at anytime with [facility] Advisor schedule   45 minute appointent  If any questions, please contact me at [phone]

## 2022-09-01 ENCOUNTER — Ambulatory Visit
Admission: RE | Admit: 2022-09-01 | Discharge: 2022-09-01 | Disposition: A | Discharge status: Home / Self Care | Payer: Medicare HMO | Source: Ambulatory Visit | Attending: Primary Care | Admitting: Primary Care

## 2022-09-01 DIAGNOSIS — Z853 Personal history of malignant neoplasm of breast: Secondary | ICD-10-CM | POA: Diagnosis not present

## 2022-09-01 DIAGNOSIS — Z1231 Encounter for screening mammogram for malignant neoplasm of breast: Secondary | ICD-10-CM

## 2022-10-06 NOTE — Telephone Encounter (Signed)
Spoke to patient's daughter Arrie Aran and was advised that her mom has been sick since last weekend. Dawn stated that they have not done a covid test. Dawn denies that her mom has a fever, difficulty breathing or SOB. Dawn stated that her mom will not go to an UC or ER and requested an appointment here tomorrow. Patient scheduled for an appointment tomorrow 10/07/22 with Romilda Garret NP at 12:00 noon.

## 2022-10-06 NOTE — Telephone Encounter (Signed)
Will evaluate in office

## 2022-10-07 ENCOUNTER — Ambulatory Visit (INDEPENDENT_AMBULATORY_CARE_PROVIDER_SITE_OTHER): Payer: Medicare HMO | Admitting: Nurse Practitioner

## 2022-10-07 ENCOUNTER — Encounter: Payer: Self-pay | Admitting: Nurse Practitioner

## 2022-10-07 VITALS — BP 124/82 | HR 87 | Ht 65.0 in | Wt 181.0 lb

## 2022-10-07 DIAGNOSIS — J0100 Acute maxillary sinusitis, unspecified: Secondary | ICD-10-CM

## 2022-10-07 DIAGNOSIS — R051 Acute cough: Secondary | ICD-10-CM | POA: Insufficient documentation

## 2022-10-07 DIAGNOSIS — J3489 Other specified disorders of nose and nasal sinuses: Secondary | ICD-10-CM | POA: Insufficient documentation

## 2022-10-07 MED ORDER — BENZONATATE 100 MG PO CAPS: 100.0000 mg | ORAL_CAPSULE | Freq: Two times a day (BID) | ORAL | 0 refills | Status: DC | PRN

## 2022-10-07 MED ORDER — FLUTICASONE PROPIONATE 50 MCG/ACT NA SUSP: 2.0000 | Freq: Every day | NASAL | 0 refills | Status: DC

## 2022-10-07 MED ORDER — AMOXICILLIN-POT CLAVULANATE 875-125 MG PO TABS: 1.0000 | ORAL_TABLET | Freq: Two times a day (BID) | ORAL | 0 refills | Status: AC

## 2022-10-07 NOTE — Patient Instructions (Signed)
Nice to see you today I have sent in a nasal spray, cough medication, and antibiotic to your pharmacy Follow up if you do not improve

## 2022-10-07 NOTE — Telephone Encounter (Signed)
Noted and appreciate Matt's evaluation.

## 2022-10-07 NOTE — Assessment & Plan Note (Signed)
Augmentin along with fluticasone nasal spray.

## 2022-10-07 NOTE — Assessment & Plan Note (Signed)
Tessalon Perles 100 mg 3 times daily as needed cough.  Fluticasone nasal spray to help with postnasal discharge

## 2022-10-07 NOTE — Assessment & Plan Note (Signed)
Will treat with Augmentin 875-125 mg twice daily for 7 days.

## 2022-10-07 NOTE — Progress Notes (Signed)
Acute Office Visit  Subjective:     Patient ID: [name], female    DOB: 1941/05/28, 82 y.o.   MRN: [id number]  Chief Complaint  Patient presents with   Nasal Congestion    W/facial pain, pressure   Cough    Productive, denies fever/shob/wheeze     Patient is in today for sick symptoms with a history of breast cancer, fibromyalgia, HTN, cirrhosis of liver and a former smoker who quit in 1972  Symptoms started on 1.5 weeks ago No sick contacts No covid test or vaccine No flu vaccine Has tried otc cough medicine and mucinex with some relief   States she has not improved since onset of symptoms     Review of Systems  Constitutional:  Positive for malaise/fatigue. Negative for chills and fever.       Appetite is normal  Fluid intake is normal   HENT:  Positive for ear pain and sinus pain. Negative for ear discharge and sore throat.   Respiratory:  Positive for cough and sputum production. Negative for shortness of breath.   Musculoskeletal:  Negative for joint pain and myalgias.  Neurological:  Negative for headaches.        Objective:    BP 124/82   Pulse 87   Ht '5\' 5"'$  (1.651 m)   Wt 181 lb (82.1 kg)   SpO2 96%   BMI 30.12 kg/m    Physical Exam Vitals and nursing note reviewed.  Constitutional:      Appearance: Normal appearance.  HENT:     Head:     Salivary Glands: Right salivary gland is not diffusely enlarged or tender. Left salivary gland is diffusely enlarged and tender.     Right Ear: Tympanic membrane, ear canal and external ear normal.     Left Ear: Tympanic membrane, ear canal and external ear normal.     Mouth/Throat:     Mouth: Mucous membranes are moist.     Pharynx: Posterior oropharyngeal erythema present.  Cardiovascular:     Rate and Rhythm: Normal rate and regular rhythm.     Heart sounds: Normal heart sounds.  Pulmonary:     Effort: Pulmonary effort is normal.     Breath sounds: Normal breath sounds.  Lymphadenopathy:      Cervical: No cervical adenopathy.  Neurological:     Mental Status: She is alert.     No results found for any visits on 10/07/22.      Assessment & Plan:   Problem List Items Addressed This Visit       Respiratory   Acute non-recurrent maxillary sinusitis - Primary    Will treat with Augmentin 875-125 mg twice daily for 7 days.      Relevant Medications   benzonatate (TESSALON) 100 MG capsule   amoxicillin-clavulanate (AUGMENTIN) 875-125 MG tablet   fluticasone (FLONASE) 50 MCG/ACT nasal spray     Other   Sinus pressure    Augmentin along with fluticasone nasal spray.      Relevant Medications   fluticasone (FLONASE) 50 MCG/ACT nasal spray   Acute cough    Tessalon Perles 100 mg 3 times daily as needed cough.  Fluticasone nasal spray to help with postnasal discharge      Relevant Medications   benzonatate (TESSALON) 100 MG capsule   fluticasone (FLONASE) 50 MCG/ACT nasal spray    Meds ordered this encounter  Medications   benzonatate (TESSALON) 100 MG capsule    Sig: Take 1 capsule (100  mg total) by mouth 2 (two) times daily as needed for cough.    Dispense:  20 capsule    Refill:  0    Order Specific Question:   Supervising Provider    Answer:   Loura Pardon A [[id number]]   amoxicillin-clavulanate (AUGMENTIN) 875-125 MG tablet    Sig: Take 1 tablet by mouth 2 (two) times daily for 7 days.    Dispense:  14 tablet    Refill:  0    Order Specific Question:   Supervising Provider    Answer:   Glori Bickers MARNE A [[id number]]   fluticasone (FLONASE) 50 MCG/ACT nasal spray    Sig: Place 2 sprays into both nostrils daily.    Dispense:  16 g    Refill:  0    Order Specific Question:   Supervising Provider    Answer:   TOWER, MARNE A [[id number]]    Return if symptoms worsen or fail to improve.  Romilda Garret, NP

## 2022-10-15 ENCOUNTER — Other Ambulatory Visit: Payer: Self-pay | Admitting: Physician Assistant

## 2022-10-21 ENCOUNTER — Ambulatory Visit (INDEPENDENT_AMBULATORY_CARE_PROVIDER_SITE_OTHER): Payer: Medicare HMO

## 2022-10-21 VITALS — Wt 181.0 lb

## 2022-10-21 DIAGNOSIS — Z0000 Encounter for general adult medical examination without abnormal findings: Secondary | ICD-10-CM | POA: Diagnosis not present

## 2022-10-21 NOTE — Patient Instructions (Signed)
Ms. [name] , Thank you for taking time to come for your Medicare Wellness Visit. I appreciate your ongoing commitment to your health goals. Please review the following plan we discussed and let me know if I can assist you in the future.   These are the goals we discussed:  Goals      Patient Stated     To get some of this weight off         This is a list of the screening recommended for you and due dates:  Health Maintenance  Topic Date Due   DTaP/Tdap/Td vaccine (2 - Td or Tdap) 01/14/2021   Medicare Annual Wellness Visit  10/22/2023   Pneumonia Vaccine  Completed   DEXA scan (bone density measurement)  Completed   Zoster (Shingles) Vaccine  Completed   HPV Vaccine  Aged Out   Flu Shot  Discontinued   COVID-19 Vaccine  Discontinued    Advanced directives: Please bring a copy of your health care power of attorney and living will to the office at your convenience.  Conditions/risks identified: lose some of this weight   Next appointment: Follow up in one year for your annual wellness visit    Preventive Care 65 Years and Older, Female Preventive care refers to lifestyle choices and visits with your health care provider that can promote health and wellness. What does preventive care include? A yearly physical exam. This is also called an annual well check. Dental exams once or twice a year. Routine eye exams. Ask your health care provider how often you should have your eyes checked. Personal lifestyle choices, including: Daily care of your teeth and gums. Regular physical activity. Eating a healthy diet. Avoiding tobacco and drug use. Limiting alcohol use. Practicing safe sex. Taking low-dose aspirin every day. Taking vitamin and mineral supplements as recommended by your health care provider. What happens during an annual well check? The services and screenings done by your health care provider during your annual well check will depend on your age, overall health,  lifestyle risk factors, and family history of disease. Counseling  Your health care provider may ask you questions about your: Alcohol use. Tobacco use. Drug use. Emotional well-being. Home and relationship well-being. Sexual activity. Eating habits. History of falls. Memory and ability to understand (cognition). Work and work Statistician. Reproductive health. Screening  You may have the following tests or measurements: Height, weight, and BMI. Blood pressure. Lipid and cholesterol levels. These may be checked every 5 years, or more frequently if you are over 28 years old. Skin check. Lung cancer screening. You may have this screening every year starting at age 87 if you have a 30-pack-year history of smoking and currently smoke or have quit within the past 15 years. Fecal occult blood test (FOBT) of the stool. You may have this test every year starting at age 80. Flexible sigmoidoscopy or colonoscopy. You may have a sigmoidoscopy every 5 years or a colonoscopy every 10 years starting at age 3. Hepatitis C blood test. Hepatitis B blood test. Sexually transmitted disease (STD) testing. Diabetes screening. This is done by checking your blood sugar (glucose) after you have not eaten for a while (fasting). You may have this done every 1-3 years. Bone density scan. This is done to screen for osteoporosis. You may have this done starting at age 51. Mammogram. This may be done every 1-2 years. Talk to your health care provider about how often you should have regular mammograms. Talk with your health  care provider about your test results, treatment options, and if necessary, the need for more tests. Vaccines  Your health care provider may recommend certain vaccines, such as: Influenza vaccine. This is recommended every year. Tetanus, diphtheria, and acellular pertussis (Tdap, Td) vaccine. You may need a Td booster every 10 years. Zoster vaccine. You may need this after age  41. Pneumococcal 13-valent conjugate (PCV13) vaccine. One dose is recommended after age 17. Pneumococcal polysaccharide (PPSV23) vaccine. One dose is recommended after age 73. Talk to your health care provider about which screenings and vaccines you need and how often you need them. This information is not intended to replace advice given to you by your health care provider. Make sure you discuss any questions you have with your health care provider. Document Released: 10/12/2015 Document Revised: 06/04/2016 Document Reviewed: 07/17/2015 Elsevier Interactive Patient Education  2017 [employer] Prevention in the Home Falls can cause injuries. They can happen to people of all ages. There are many things you can do to make your home safe and to help prevent falls. What can I do on the outside of my home? Regularly fix the edges of walkways and driveways and fix any cracks. Remove anything that might make you trip as you walk through a door, such as a raised step or threshold. Trim any bushes or trees on the path to your home. Use bright outdoor lighting. Clear any walking paths of anything that might make someone trip, such as rocks or tools. Regularly check to see if handrails are loose or broken. Make sure that both sides of any steps have handrails. Any raised decks and porches should have guardrails on the edges. Have any leaves, snow, or ice cleared regularly. Use sand or salt on walking paths during winter. Clean up any spills in your garage right away. This includes oil or grease spills. What can I do in the bathroom? Use night lights. Install grab bars by the toilet and in the tub and shower. Do not use towel bars as grab bars. Use non-skid mats or decals in the tub or shower. If you need to sit down in the shower, use a plastic, non-slip stool. Keep the floor dry. Clean up any water that spills on the floor as soon as it happens. Remove soap buildup in the tub or shower  regularly. Attach bath mats securely with double-sided non-slip rug tape. Do not have throw rugs and other things on the floor that can make you trip. What can I do in the bedroom? Use night lights. Make sure that you have a light by your bed that is easy to reach. Do not use any sheets or blankets that are too big for your bed. They should not hang down onto the floor. Have a firm chair that has side arms. You can use this for support while you get dressed. Do not have throw rugs and other things on the floor that can make you trip. What can I do in the kitchen? Clean up any spills right away. Avoid walking on wet floors. Keep items that you use a lot in easy-to-reach places. If you need to reach something above you, use a strong step stool that has a grab bar. Keep electrical cords out of the way. Do not use floor polish or wax that makes floors slippery. If you must use wax, use non-skid floor wax. Do not have throw rugs and other things on the floor that can make you trip. What can  I do with my stairs? Do not leave any items on the stairs. Make sure that there are handrails on both sides of the stairs and use them. Fix handrails that are broken or loose. Make sure that handrails are as long as the stairways. Check any carpeting to make sure that it is firmly attached to the stairs. Fix any carpet that is loose or worn. Avoid having throw rugs at the top or bottom of the stairs. If you do have throw rugs, attach them to the floor with carpet tape. Make sure that you have a light switch at the top of the stairs and the bottom of the stairs. If you do not have them, ask someone to add them for you. What else can I do to help prevent falls? Wear shoes that: Do not have high heels. Have rubber bottoms. Are comfortable and fit you well. Are closed at the toe. Do not wear sandals. If you use a stepladder: Make sure that it is fully opened. Do not climb a closed stepladder. Make sure that  both sides of the stepladder are locked into place. Ask someone to hold it for you, if possible. Clearly mark and make sure that you can see: Any grab bars or handrails. First and last steps. Where the edge of each step is. Use tools that help you move around (mobility aids) if they are needed. These include: Canes. Walkers. Scooters. Crutches. Turn on the lights when you go into a dark area. Replace any light bulbs as soon as they burn out. Set up your furniture so you have a clear path. Avoid moving your furniture around. If any of your floors are uneven, fix them. If there are any pets around you, be aware of where they are. Review your medicines with your doctor. Some medicines can make you feel dizzy. This can increase your chance of falling. Ask your doctor what other things that you can do to help prevent falls. This information is not intended to replace advice given to you by your health care provider. Make sure you discuss any questions you have with your health care provider. Document Released: 07/12/2009 Document Revised: 02/21/2016 Document Reviewed: 10/20/2014 Elsevier Interactive Patient Education  2017 [employer].

## 2022-10-21 NOTE — Progress Notes (Signed)
I connected with  Camilla Skeen Sisney on 10/21/22 by a audio enabled telemedicine application and verified that I am speaking with the correct person using two identifiers.  Patient Location: Home  Provider Location: Office/Clinic  I discussed the limitations of evaluation and management by telemedicine. The patient expressed understanding and agreed to proceed.   Subjective:   [name] is a 82 y.o. female who presents for Medicare Annual (Subsequent) preventive examination.  Review of Systems     Cardiac Risk Factors include: advanced age (>65mn, >>69women);obesity (BMI >30kg/m2);hypertension;dyslipidemia     Objective:    Today's Vitals   10/21/22 1429  Weight: 181 lb (82.1 kg)   Body mass index is 30.12 kg/m.     10/21/2022    2:35 PM 06/05/2021   12:16 PM 05/24/2021    1:34 PM 05/09/2021   11:32 AM 03/07/2019    9:15 AM 02/17/2018    9:17 AM 02/11/2017    9:24 AM  Advanced Directives  Does Patient Have a Medical Advance Directive? Yes Yes Yes Yes Yes Yes Yes  Type of AParamedicof ARosetoLiving will HLeveringLiving will Living will;Healthcare Power of AAnthemLiving will HTimkenLiving will HBrysonLiving will HGordonLiving will  Does patient want to make changes to medical advance directive?   No - Patient declined No - Patient declined     Copy of HPricein Chart? No - copy requested No - copy requested No - copy requested  No - copy requested No - copy requested No - copy requested    Current Medications (verified) Outpatient Encounter Medications as of 10/21/2022  Medication Sig   benzonatate (TESSALON) 100 MG capsule Take 1 capsule (100 mg total) by mouth 2 (two) times daily as needed for cough.   Cholecalciferol (VITAMIN D) 125 MCG (5000 UT) CAPS Take 125 mcg by mouth. Takes 1 per day    Cranberry-Vitamin C-Vitamin E (CRANBERRY PLUS VITAMIN C) 4200-20-3 MG-MG-UNIT CAPS Take 4,200 mg by mouth. Takes 1 per day   escitalopram (LEXAPRO) 10 MG tablet Take 1 tablet (10 mg total) by mouth daily. For anxiety.   ferrous sulfate 325 (65 FE) MG tablet Take 325 mg by mouth daily with breakfast.   hydrOXYzine (ATARAX/VISTARIL) 10 MG tablet TAKE 1 TABLET BY MOUTH EVERY DAY AS NEEDED FOR ANXIETY   levothyroxine (SYNTHROID) 75 MCG tablet TAKE 1 TABLET BY MOUTH EVERY MORNING ON AN EMPTY STOMACH WITH WATER ONLY. NO FOOD OR OTHER MEDICATIONS FOR 30 MINUTES.   losartan (COZAAR) 50 MG tablet Take 1 tablet (50 mg total) by mouth daily. for blood pressure.   MULTIPLE VITAMIN PO Take by mouth. Patient states she takes 1 per day.   omeprazole (PRILOSEC) 40 MG capsule TAKE 1 CAPSULE (40 MG TOTAL) BY MOUTH 2 (TWO) TIMES DAILY BEFORE A MEAL.   fluticasone (FLONASE) 50 MCG/ACT nasal spray Place 2 sprays into both nostrils daily. (Patient not taking: Reported on 10/21/2022)   No facility-administered encounter medications on file as of 10/21/2022.    Allergies (verified) Ciprofloxacin and Codeine   History: Past Medical History:  Diagnosis Date   Acute non-recurrent sinusitis 05/17/2018   Adenocarcinoma, breast ([facility]    Anxiety    Colonic polyp    5 mm adenoma 1977   Degenerative joint disease    Fibromyalgia    Gastrointestinal hemorrhage with melena 05/28/2021   GERD (gastroesophageal reflux disease)  Hemorrhoids    History of anemia    History of shingles    Hyperlipidemia    Hypertension    Hypothyroidism    IBS (irritable bowel syndrome)    NASH (nonalcoholic steatohepatitis)    Osteopenia    Personal history of chemotherapy 2005   Left Breast Cancer   Personal history of radiation therapy 2005   Left Breast Cancer   UTI (urinary tract infection) 03/01/2015   Wears glasses    Past Surgical History:  Procedure Laterality Date   ABDOMINAL HYSTERECTOMY     APPENDECTOMY      BILATERAL SALPINGOOPHORECTOMY  1978   BREAST LUMPECTOMY Left 2005   left--node dissection 7/05 by Dr. Charlynne Pander   ESOPHAGOGASTRODUODENOSCOPY (EGD) WITH PROPOFOL N/A 06/05/2021   Procedure: ESOPHAGOGASTRODUODENOSCOPY (EGD) WITH PROPOFOL;  Surgeon: Lavena Bullion, DO;  Location: [facility];  Service: Gastroenterology;  Laterality: N/A;   HEMOSTASIS CLIP PLACEMENT  06/05/2021   Procedure: HEMOSTASIS CLIP PLACEMENT;  Surgeon: Lavena Bullion, DO;  Location: [facility];  Service: Gastroenterology;;   HOT HEMOSTASIS N/A 06/05/2021   Procedure: HOT HEMOSTASIS (ARGON PLASMA COAGULATION/BICAP);  Surgeon: Lavena Bullion, DO;  Location: [facility];  Service: Gastroenterology;  Laterality: N/A;   LAPAROSCOPIC CHOLECYSTECTOMY     POLYPECTOMY  06/05/2021   Procedure: POLYPECTOMY;  Surgeon: Lavena Bullion, DO;  Location: [facility];  Service: Gastroenterology;;   ROTATOR CUFF REPAIR     left   Family History  Problem Relation Age of Onset   Stroke Mother    Prostate cancer Father    Emphysema Sister    Crohn's disease Sister    Prostate cancer Brother    Aneurysm Brother    Liver disease Brother    Hypertension Brother    Social History   Socioeconomic History   Marital status: Married    Spouse name: [name]   Number of children: 2   Years of education: Not on file   Highest education level: Not on file  Occupational History   Occupation: retired  Tobacco Use   Smoking status: Former    Packs/day: 0.20    Years: 7.00    Total pack years: 1.40    Types: Cigarettes    Quit date: 09/29/1970    Years since quitting: 52.0   Smokeless tobacco: Never   Tobacco comments:    1 pack per week  Vaping Use   Vaping Use: Never used  Substance and Sexual Activity   Alcohol use: Not Currently   Drug use: No   Sexual activity: Yes  Other Topics Concern   Not on file  Social History Narrative   Would desire CPR   Social Determinants of Health   Financial Resource Strain: Low  Risk  (10/21/2022)   Overall Financial Resource Strain (CARDIA)    Difficulty of Paying Living Expenses: Not hard at all  Food Insecurity: No Food Insecurity (10/21/2022)   Hunger Vital Sign    Worried About Running Out of Food in the Last Year: Never true    [place] in the Last Year: Never true  Transportation Needs: No Transportation Needs (10/21/2022)   PRAPARE - Hydrologist (Medical): No    Lack of Transportation (Non-Medical): No  Physical Activity: Insufficiently Active (10/21/2022)   Exercise Vital Sign    Days of Exercise per Week: 3 days    Minutes of Exercise per Session: 30 min  Stress: No Stress Concern Present (10/21/2022)   [place]  [employer] - Occupational Stress Questionnaire    Feeling of Stress : Not at all  Social Connections: Moderately Isolated (10/21/2022)   Social Connection and Isolation Panel [NHANES]    Frequency of Communication with Friends and Family: More than three times a week    Frequency of Social Gatherings with Friends and Family: More than three times a week    Attends Religious Services: Never    Marine scientist or Organizations: No    Attends Music therapist: Never    Marital Status: Married    Tobacco Counseling Counseling given: Not Answered Tobacco comments: 1 pack per week   Clinical Intake:  Pre-visit preparation completed: Yes  Pain : No/denies pain     BMI - recorded: 30.12 Nutritional Status: BMI > 30  Obese Nutritional Risks: None Diabetes: No  How often do you need to have someone help you when you read instructions, pamphlets, or other written materials from your doctor or pharmacy?: 1 - Never  Diabetic?no  Interpreter Needed?: No  Information entered by :: Charlott Rakes, LPN   Activities of Daily Living    10/21/2022    2:36 PM  In your present state of health, do you have any difficulty performing the following activities:  Hearing?  1  Comment left ear deaf  Vision? 0  Difficulty concentrating or making decisions? 0  Walking or climbing stairs? 1  Comment at times difficult going up  Dressing or bathing? 0  Doing errands, shopping? 0  Preparing Food and eating ? N  Using the Toilet? N  In the past six months, have you accidently leaked urine? N  Do you have problems with loss of bowel control? N  Managing your Medications? N  Managing your Finances? N  Housekeeping or managing your Housekeeping? N    Patient Care Team: [name], NP as PCP - General (Internal Medicine) Gatha Mayer, MD as Consulting Physician (Gastroenterology) Magrinat, Virgie Dad, MD (Inactive) as Consulting Physician (Oncology) Susa Day, MD as Consulting Physician (Orthopedic Surgery) Roosevelt Locks, CRNP as Nurse Practitioner (Nurse Practitioner)  Indicate any recent Medical Services you may have received from other than [facility] providers in the past year (date may be approximate).     Assessment:   This is a routine wellness examination for [place].  Hearing/Vision screen Hearing Screening - Comments:: Deaf in left ear  Vision Screening - Comments:: Pt follows up with Dr Lynnell Grain for annual eye exams   Dietary issues and exercise activities discussed: Current Exercise Habits: Home exercise routine, Type of exercise: walking, Time (Minutes): 30, Frequency (Times/Week): 3, Weekly Exercise (Minutes/Week): 90   Goals Addressed             This Visit's Progress    Patient Stated       To get some of this weight off        Depression Screen    10/21/2022    2:33 PM 05/14/2022    2:52 PM 05/09/2021   11:38 AM 05/07/2021   10:51 AM 03/07/2019    9:11 AM 02/17/2018    9:18 AM 02/11/2017    9:24 AM  PHQ 2/9 Scores  PHQ - 2 Score 0 0 0 0 0 0 0  PHQ- 9 Score  0  0 0 0     Fall Risk    10/21/2022    2:35 PM 05/14/2022    2:55 PM 05/09/2021   11:37 AM 05/07/2021   10:51 AM  03/07/2019    9:11 AM  Fall Risk   Falls in the past  year? 0 0 1 1 0  Number falls in past yr: 0 0 1 0   Injury with Fall? 0 0 1 0   Comment   bruised leg. patient state she stepped on a board and it broke    Risk for fall due to : Impaired vision;Impaired balance/gait History of fall(s);Impaired balance/gait Other (Comment)    Follow up Falls prevention discussed Falls evaluation completed Falls prevention discussed      FALL RISK PREVENTION PERTAINING TO THE HOME:  Any stairs in or around the home? Yes  If so, are there any without handrails? No  Home free of loose throw rugs in walkways, pet beds, electrical cords, etc? Yes  Adequate lighting in your home to reduce risk of falls? Yes   ASSISTIVE DEVICES UTILIZED TO PREVENT FALLS:  Life alert? No  Use of a cane, walker or w/c? Yes  Grab bars in the bathroom? No  Shower chair or bench in shower? No  Elevated toilet seat or a handicapped toilet? No   TIMED UP AND GO:  Was the test performed? No .   Cognitive Function:    03/07/2019    9:18 AM 02/17/2018    9:18 AM 02/11/2017    9:25 AM  MMSE - Mini Mental State Exam  Orientation to time '5 5 5  '$ Orientation to Place '5 5 5  '$ Registration '3 3 3  '$ Attention/ Calculation 0 0 0  Recall '3 3 3  '$ Language- name 2 objects 0 0 0  Language- repeat '1 1 1  '$ Language- follow 3 step command 0 3 3  Language- read & follow direction 0 0 0  Write a sentence 0 0 0  Copy design 0 0 0  Total score '17 20 20        '$ 10/21/2022    2:37 PM  6CIT Screen  What Year? 0 points  What month? 0 points  What time? 0 points  Count back from 20 0 points  Months in reverse 2 points  Repeat phrase 0 points  Total Score 2 points    Immunizations Immunization History  Administered Date(s) Administered   Pneumococcal Conjugate-13 04/23/2020   Pneumococcal Polysaccharide-23 05/07/2021   Tdap 01/15/2011   Zoster Recombinat (Shingrix) 05/22/2020, 06/13/2022    TDAP status: Due, Education has been provided regarding the importance of this vaccine.  Advised may receive this vaccine at local pharmacy or Health Dept. Aware to provide a copy of the vaccination record if obtained from local pharmacy or Health Dept. Verbalized acceptance and understanding.  Flu Vaccine status: Declined, Education has been provided regarding the importance of this vaccine but patient still declined. Advised may receive this vaccine at local pharmacy or Health Dept. Aware to provide a copy of the vaccination record if obtained from local pharmacy or Health Dept. Verbalized acceptance and understanding.  Pneumococcal vaccine status: Up to date  Covid-19 vaccine status: Declined, Education has been provided regarding the importance of this vaccine but patient still declined. Advised may receive this vaccine at local pharmacy or Health Dept.or vaccine clinic. Aware to provide a copy of the vaccination record if obtained from local pharmacy or Health Dept. Verbalized acceptance and understanding.  Qualifies for Shingles Vaccine? Yes   Zostavax completed Yes   Shingrix Completed?: Yes  Screening Tests Health Maintenance  Topic Date Due   DTaP/Tdap/Td (2 - Td or Tdap) 01/14/2021   Medicare  Annual Wellness (AWV)  10/22/2023   Pneumonia Vaccine 54+ Years old  Completed   DEXA SCAN  Completed   Zoster Vaccines- Shingrix  Completed   HPV VACCINES  Aged Out   INFLUENZA VACCINE  Discontinued   COVID-19 Vaccine  Discontinued    Health Maintenance  Health Maintenance Due  Topic Date Due   DTaP/Tdap/Td (2 - Td or Tdap) 01/14/2021    Colorectal cancer screening: No longer required.   Mammogram status: Completed 09/01/22. Repeat every year  Bone Density status: Completed 08/07/20. Results reflect: Bone density results: OSTEOPENIA. Repeat every 2 pt scheduled 11/17/22 years.   Additional Screening:   Vision Screening: Recommended annual ophthalmology exams for early detection of glaucoma and other disorders of the eye. Is the patient up to date with their annual  eye exam?  Yes  Who is the provider or what is the name of the office in which the patient attends annual eye exams? Dr Lynnell Grain  If pt is not established with a provider, would they like to be referred to a provider to establish care? No .   Dental Screening: Recommended annual dental exams for proper oral hygiene  Community Resource Referral / Chronic Care Management: CRR required this visit?  No   CCM required this visit?  No      Plan:     I have personally reviewed and noted the following in the patient's chart:   Medical and social history Use of alcohol, tobacco or illicit drugs  Current medications and supplements including opioid prescriptions. Patient is not currently taking opioid prescriptions. Functional ability and status Nutritional status Physical activity Advanced directives List of other physicians Hospitalizations, surgeries, and ER visits in previous 12 months Vitals Screenings to include cognitive, depression, and falls Referrals and appointments  In addition, I have reviewed and discussed with patient certain preventive protocols, quality metrics, and best practice recommendations. A written personalized care plan for preventive services as well as general preventive health recommendations were provided to patient.     Willette Brace, LPN   6/72/0947   Nurse Notes: none

## 2022-10-29 ENCOUNTER — Other Ambulatory Visit: Payer: Self-pay | Admitting: Nurse Practitioner

## 2022-10-29 DIAGNOSIS — J3489 Other specified disorders of nose and nasal sinuses: Secondary | ICD-10-CM

## 2022-10-29 DIAGNOSIS — R051 Acute cough: Secondary | ICD-10-CM

## 2022-11-17 ENCOUNTER — Ambulatory Visit
Admission: RE | Admit: 2022-11-17 | Discharge: 2022-11-17 | Disposition: A | Discharge status: Home / Self Care | Payer: Medicare HMO | Source: Ambulatory Visit | Attending: Primary Care | Admitting: Primary Care

## 2022-11-17 DIAGNOSIS — E2839 Other primary ovarian failure: Secondary | ICD-10-CM

## 2022-11-17 DIAGNOSIS — M8589 Other specified disorders of bone density and structure, multiple sites: Secondary | ICD-10-CM | POA: Diagnosis not present

## 2022-11-17 DIAGNOSIS — Z780 Asymptomatic menopausal state: Secondary | ICD-10-CM | POA: Diagnosis not present

## 2022-12-15 DIAGNOSIS — K7460 Unspecified cirrhosis of liver: Secondary | ICD-10-CM | POA: Diagnosis not present

## 2022-12-15 DIAGNOSIS — R188 Other ascites: Secondary | ICD-10-CM | POA: Diagnosis not present

## 2022-12-15 DIAGNOSIS — D376 Neoplasm of uncertain behavior of liver, gallbladder and bile ducts: Secondary | ICD-10-CM | POA: Diagnosis not present

## 2022-12-15 DIAGNOSIS — I8510 Secondary esophageal varices without bleeding: Secondary | ICD-10-CM | POA: Diagnosis not present

## 2022-12-15 DIAGNOSIS — K7581 Nonalcoholic steatohepatitis (NASH): Secondary | ICD-10-CM | POA: Diagnosis not present

## 2022-12-16 ENCOUNTER — Other Ambulatory Visit (HOSPITAL_COMMUNITY): Payer: Self-pay | Admitting: Nurse Practitioner

## 2022-12-16 DIAGNOSIS — K7460 Unspecified cirrhosis of liver: Secondary | ICD-10-CM

## 2022-12-24 DIAGNOSIS — R188 Other ascites: Secondary | ICD-10-CM | POA: Diagnosis not present

## 2022-12-29 ENCOUNTER — Ambulatory Visit
Admission: RE | Admit: 2022-12-29 | Discharge: 2022-12-29 | Disposition: A | Discharge status: Home / Self Care | Payer: Medicare HMO | Source: Ambulatory Visit | Attending: Nurse Practitioner | Admitting: Nurse Practitioner

## 2022-12-29 DIAGNOSIS — R161 Splenomegaly, not elsewhere classified: Secondary | ICD-10-CM | POA: Diagnosis not present

## 2022-12-29 DIAGNOSIS — K7460 Unspecified cirrhosis of liver: Secondary | ICD-10-CM | POA: Diagnosis not present

## 2022-12-29 DIAGNOSIS — R188 Other ascites: Secondary | ICD-10-CM | POA: Diagnosis not present

## 2022-12-29 DIAGNOSIS — K7581 Nonalcoholic steatohepatitis (NASH): Secondary | ICD-10-CM | POA: Insufficient documentation

## 2023-03-30 ENCOUNTER — Other Ambulatory Visit: Payer: Self-pay | Admitting: Primary Care

## 2023-03-30 DIAGNOSIS — F411 Generalized anxiety disorder: Secondary | ICD-10-CM

## 2023-03-30 NOTE — Telephone Encounter (Signed)
Patient is due for CPE/follow up in late August, this will be required prior to any further refills.  Please schedule, thank you!   

## 2023-03-30 NOTE — Telephone Encounter (Signed)
Patient scheduled.

## 2023-04-04 ENCOUNTER — Other Ambulatory Visit: Payer: Self-pay | Admitting: Primary Care

## 2023-04-04 DIAGNOSIS — I10 Essential (primary) hypertension: Secondary | ICD-10-CM

## 2023-05-14 ENCOUNTER — Encounter (INDEPENDENT_AMBULATORY_CARE_PROVIDER_SITE_OTHER): Payer: Self-pay

## 2023-05-19 ENCOUNTER — Encounter: Payer: Medicare HMO | Admitting: Primary Care

## 2023-05-28 ENCOUNTER — Encounter: Payer: Medicare HMO | Admitting: Primary Care

## 2023-06-06 ENCOUNTER — Other Ambulatory Visit: Payer: Self-pay | Admitting: Primary Care

## 2023-06-06 DIAGNOSIS — E039 Hypothyroidism, unspecified: Secondary | ICD-10-CM

## 2023-06-09 ENCOUNTER — Encounter: Payer: Self-pay | Admitting: Primary Care

## 2023-06-09 ENCOUNTER — Ambulatory Visit (INDEPENDENT_AMBULATORY_CARE_PROVIDER_SITE_OTHER): Payer: Medicare HMO | Admitting: Primary Care

## 2023-06-09 VITALS — BP 116/60 | HR 59 | Temp 97.2°F | Ht 65.0 in | Wt 171.0 lb

## 2023-06-09 DIAGNOSIS — E782 Mixed hyperlipidemia: Secondary | ICD-10-CM

## 2023-06-09 DIAGNOSIS — I10 Essential (primary) hypertension: Secondary | ICD-10-CM

## 2023-06-09 DIAGNOSIS — F411 Generalized anxiety disorder: Secondary | ICD-10-CM | POA: Diagnosis not present

## 2023-06-09 DIAGNOSIS — K219 Gastro-esophageal reflux disease without esophagitis: Secondary | ICD-10-CM

## 2023-06-09 DIAGNOSIS — Z853 Personal history of malignant neoplasm of breast: Secondary | ICD-10-CM

## 2023-06-09 DIAGNOSIS — Z0000 Encounter for general adult medical examination without abnormal findings: Secondary | ICD-10-CM

## 2023-06-09 DIAGNOSIS — E785 Hyperlipidemia, unspecified: Secondary | ICD-10-CM

## 2023-06-09 DIAGNOSIS — G8929 Other chronic pain: Secondary | ICD-10-CM

## 2023-06-09 DIAGNOSIS — M5440 Lumbago with sciatica, unspecified side: Secondary | ICD-10-CM | POA: Diagnosis not present

## 2023-06-09 DIAGNOSIS — E038 Other specified hypothyroidism: Secondary | ICD-10-CM | POA: Diagnosis not present

## 2023-06-09 DIAGNOSIS — D509 Iron deficiency anemia, unspecified: Secondary | ICD-10-CM

## 2023-06-09 DIAGNOSIS — Z1231 Encounter for screening mammogram for malignant neoplasm of breast: Secondary | ICD-10-CM

## 2023-06-09 DIAGNOSIS — K7460 Unspecified cirrhosis of liver: Secondary | ICD-10-CM | POA: Diagnosis not present

## 2023-06-09 NOTE — Assessment & Plan Note (Signed)
Controlled.   Continue losartan 50 mg daily. CMP pending. 

## 2023-06-09 NOTE — Assessment & Plan Note (Signed)
Controlled.  Continue Lexapro 10 mg daily. Continue hydroxyzine 10 mg PRN

## 2023-06-09 NOTE — Assessment & Plan Note (Signed)
Controlled.  Continue omeprazole 40 mg BID.

## 2023-06-09 NOTE — Assessment & Plan Note (Signed)
Mammogram UTD, due in December 2024

## 2023-06-09 NOTE — Assessment & Plan Note (Signed)
Immunizations UTD. Influenza vaccine provided today. Mammogram due in December, orders placed. Colonoscopy N/A given age.  Discussed the importance of a healthy diet and regular exercise in order for weight loss, and to reduce the risk of further co-morbidity.  Exam stable. Labs pending.  Follow up in 1 year for repeat physical.

## 2023-06-09 NOTE — Assessment & Plan Note (Signed)
She is taking levothyroxine correctly.  Continue levothyroxine 75 mcg. Repeat TSH pending.

## 2023-06-09 NOTE — Assessment & Plan Note (Signed)
Stable. Continue Tylenol PRN.

## 2023-06-09 NOTE — Assessment & Plan Note (Signed)
Repeat lipid panel pending. Not on treatment.  

## 2023-06-09 NOTE — Assessment & Plan Note (Signed)
Following with hepatology clinic. Reviewed office notes from March 2024.

## 2023-06-09 NOTE — Patient Instructions (Signed)
Stop by the lab prior to leaving today. I will notify you of your results once received.   [facility] schedule your [facility].   It was a pleasure to see you today!   

## 2023-06-09 NOTE — Progress Notes (Signed)
Subjective:    Patient ID: [name], female    DOB: Aug 01, 1941, 82 y.o.   MRN: [id number]  HPI  [name] is a very pleasant 82 y.o. female who presents today for complete physical and follow up of chronic conditions.  Immunizations: -Tetanus: Completed in 2012 -Influenza: Completed today -Shingles: Completed Shingrix series -Pneumonia: Completed Prevnar 13 in 2021, Pneumovax 23 in 2022  Diet: Fair diet.  Exercise: No regular exercise.  Eye exam: Completed years ago  Dental exam: Completes semi-annually     Mammogram: Completed in December 2023 Bone Density Scan: Completed in 2024  Colonoscopy: Completed in 2011  BP Readings from Last 3 Encounters:  06/09/23 116/60  10/07/22 124/82  07/10/22 128/78      Review of Systems  Constitutional:  Negative for unexpected weight change.  HENT:  Negative for rhinorrhea.   Respiratory:  Negative for cough and shortness of breath.   Cardiovascular:  Negative for chest pain.  Gastrointestinal:  Negative for constipation and diarrhea.  Genitourinary:  Negative for difficulty urinating.  Musculoskeletal:  Negative for arthralgias and myalgias.  Skin:  Negative for rash.  Allergic/Immunologic: Negative for environmental allergies.  Neurological:  Negative for dizziness and headaches.  Psychiatric/Behavioral:  The patient is not nervous/anxious.          Past Medical History:  Diagnosis Date   Acute non-recurrent maxillary sinusitis 05/17/2018   Acute non-recurrent sinusitis 05/17/2018   Adenocarcinoma, breast (HCC)    Anxiety    Colonic polyp    5 mm adenoma 1977   Degenerative joint disease    Fibromyalgia    Gastrointestinal hemorrhage with melena 05/28/2021   GERD (gastroesophageal reflux disease)    Hemorrhoids    History of anemia    History of shingles    Hyperlipidemia    Hypertension    Hypothyroidism    IBS (irritable bowel syndrome)    Malignant neoplasm of female breast (HCC)  10/21/2007   Formatting of this note might be different from the original. Invasive mammillary carcinoma left breast     NASH (nonalcoholic steatohepatitis)    Osteopenia    Personal history of chemotherapy 2005   Left Breast Cancer   Personal history of radiation therapy 2005   Left Breast Cancer   UTI (urinary tract infection) 03/01/2015   Wears glasses     Social History   Socioeconomic History   Marital status: Married    Spouse name: Chrissie Noa   Number of children: 2   Years of education: Not on file   Highest education level: Not on file  Occupational History   Occupation: retired  Tobacco Use   Smoking status: Former    Current packs/day: 0.00    Average packs/day: 0.2 packs/day for 7.0 years (1.4 ttl pk-yrs)    Types: Cigarettes    Start date: 09/30/1963    Quit date: 09/29/1970    Years since quitting: 52.7   Smokeless tobacco: Never   Tobacco comments:    1 pack per week  Vaping Use   Vaping status: Never Used  Substance and Sexual Activity   Alcohol use: Not Currently   Drug use: No   Sexual activity: Yes  Other Topics Concern   Not on file  Social History Narrative   Would desire CPR   Social Determinants of Health   Financial Resource Strain: Low Risk  (10/21/2022)   Overall Financial Resource Strain (CARDIA)    Difficulty of Paying Living Expenses: Not hard at all  Food Insecurity: No Food Insecurity (10/21/2022)   Hunger Vital Sign    Worried About Running Out of Food in the Last Year: Never true    Ran Out of Food in the Last Year: Never true  Transportation Needs: No Transportation Needs (10/21/2022)   PRAPARE - Administrator, Civil Service (Medical): No    Lack of Transportation (Non-Medical): No  Physical Activity: Insufficiently Active (10/21/2022)   Exercise Vital Sign    Days of Exercise per Week: 3 days    Minutes of Exercise per Session: 30 min  Stress: No Stress Concern Present (10/21/2022)   [employer] of Occupational  Health - Occupational Stress Questionnaire    Feeling of Stress : Not at all  Social Connections: Moderately Isolated (10/21/2022)   Social Connection and Isolation Panel [NHANES]    Frequency of Communication with Friends and Family: More than three times a week    Frequency of Social Gatherings with Friends and Family: More than three times a week    Attends Religious Services: Never    Database administrator or Organizations: No    Attends Banker Meetings: Never    Marital Status: Married  Catering manager Violence: Not At Risk (10/21/2022)   Humiliation, Afraid, Rape, and Kick questionnaire    Fear of Current or Ex-Partner: No    Emotionally Abused: No    Physically Abused: No    Sexually Abused: No    Past Surgical History:  Procedure Laterality Date   ABDOMINAL HYSTERECTOMY     APPENDECTOMY     BILATERAL SALPINGOOPHORECTOMY  1978   BREAST LUMPECTOMY Left 2005   left--node dissection 7/05 by Dr. Luan Moore   ESOPHAGOGASTRODUODENOSCOPY (EGD) WITH PROPOFOL N/A 06/05/2021   Procedure: ESOPHAGOGASTRODUODENOSCOPY (EGD) WITH PROPOFOL;  Surgeon: Shellia Cleverly, DO;  Location: [facility];  Service: Gastroenterology;  Laterality: N/A;   HEMOSTASIS CLIP PLACEMENT  06/05/2021   Procedure: HEMOSTASIS CLIP PLACEMENT;  Surgeon: Shellia Cleverly, DO;  Location: [facility];  Service: Gastroenterology;;   HOT HEMOSTASIS N/A 06/05/2021   Procedure: HOT HEMOSTASIS (ARGON PLASMA COAGULATION/BICAP);  Surgeon: Shellia Cleverly, DO;  Location: [facility];  Service: Gastroenterology;  Laterality: N/A;   LAPAROSCOPIC CHOLECYSTECTOMY     POLYPECTOMY  06/05/2021   Procedure: POLYPECTOMY;  Surgeon: Shellia Cleverly, DO;  Location: [facility];  Service: Gastroenterology;;   ROTATOR CUFF REPAIR     left    Family History  Problem Relation Age of Onset   Stroke Mother    Prostate cancer Father    Emphysema Sister    Crohn's disease Sister    Prostate cancer Brother    Aneurysm  Brother    Liver disease Brother    Hypertension Brother     Allergies  Allergen Reactions   Ciprofloxacin Diarrhea   Codeine     REACTION: nausea and vomiting    Current Outpatient Medications on File Prior to Visit  Medication Sig Dispense Refill   Cholecalciferol (VITAMIN D) 125 MCG (5000 UT) CAPS Take 125 mcg by mouth. Takes 1 per day     escitalopram (LEXAPRO) 10 MG tablet TAKE 1 TABLET (10 MG TOTAL) BY MOUTH DAILY. FOR ANXIETY. 90 tablet 0   ferrous sulfate 325 (65 FE) MG tablet Take 325 mg by mouth daily with breakfast.     hydrOXYzine (ATARAX/VISTARIL) 10 MG tablet TAKE 1 TABLET BY MOUTH EVERY DAY AS NEEDED FOR ANXIETY 90 tablet 0   levothyroxine (SYNTHROID) 75 MCG tablet TAKE  1 TABLET BY MOUTH EVERY MORNING ON AN EMPTY STOMACH WITH WATER ONLY. NO FOOD OR OTHER MEDICATIONS FOR 30 MINUTES. 90 tablet 0   losartan (COZAAR) 50 MG tablet TAKE 1 TABLET (50 MG TOTAL) BY MOUTH DAILY. FOR BLOOD PRESSURE. 90 tablet 0   MULTIPLE VITAMIN PO Take by mouth. Patient states she takes 1 per day.     omeprazole (PRILOSEC) 40 MG capsule TAKE 1 CAPSULE (40 MG TOTAL) BY MOUTH 2 (TWO) TIMES DAILY BEFORE A MEAL. 180 capsule 2   benzonatate (TESSALON) 100 MG capsule Take 1 capsule (100 mg total) by mouth 2 (two) times daily as needed for cough. (Patient not taking: Reported on 06/09/2023) 20 capsule 0   Cranberry-Vitamin C-Vitamin E (CRANBERRY PLUS VITAMIN C) 4200-20-3 MG-MG-UNIT CAPS Take 4,200 mg by mouth. Takes 1 per day (Patient not taking: Reported on 06/09/2023)     fluticasone (FLONASE) 50 MCG/ACT nasal spray Place 2 sprays into both nostrils daily. (Patient not taking: Reported on 06/09/2023) 16 g 0   No current facility-administered medications on file prior to visit.    BP 116/60   Pulse (!) 59   Temp (!) 97.2 F (36.2 C) (Temporal)   Ht 5\' 5"  (1.651 m)   Wt 171 lb (77.6 kg)   SpO2 98%   BMI 28.46 kg/m  Objective:   Physical Exam HENT:     Right Ear: Tympanic membrane and ear canal  normal.     Left Ear: Tympanic membrane and ear canal normal.     Nose: Nose normal.  Eyes:     Conjunctiva/sclera: Conjunctivae normal.     Pupils: Pupils are equal, round, and reactive to light.  Neck:     Thyroid: No thyromegaly.  Cardiovascular:     Rate and Rhythm: Normal rate and regular rhythm.     Heart sounds: No murmur heard. Pulmonary:     Effort: Pulmonary effort is normal.     Breath sounds: Normal breath sounds. No rales.  Abdominal:     General: Bowel sounds are normal.     Palpations: Abdomen is soft.     Tenderness: There is no abdominal tenderness.  Musculoskeletal:        General: Normal range of motion.     Cervical back: Neck supple.  Lymphadenopathy:     Cervical: No cervical adenopathy.  Skin:    General: Skin is warm and dry.     Findings: No rash.  Neurological:     Mental Status: She is alert and oriented to person, place, and time.     Cranial Nerves: No cranial nerve deficit.     Deep Tendon Reflexes: Reflexes are normal and symmetric.  Psychiatric:        Mood and Affect: Mood normal.           Assessment & Plan:  Preventative health care Assessment & Plan: Immunizations UTD. Influenza vaccine provided today. Mammogram due in December, orders placed. Colonoscopy N/A given age.  Discussed the importance of a healthy diet and regular exercise in order for weight loss, and to reduce the risk of further co-morbidity.  Exam stable. Labs pending.  Follow up in 1 year for repeat physical.    Essential hypertension Assessment & Plan: Controlled.  Continue losartan 50 mg daily. CMP pending.  Orders: -     CBC with Differential/Platelet -     Comprehensive metabolic panel  Cirrhosis of liver without ascites, unspecified hepatic cirrhosis type [facility]) Assessment & Plan: Following with hepatology clinic.  Reviewed office notes from March 2024.    Gastroesophageal reflux disease without esophagitis Assessment &  Plan: Controlled.  Continue omeprazole 40 mg BID.   Other specified hypothyroidism Assessment & Plan: She is taking levothyroxine correctly.  Continue levothyroxine 75 mcg. Repeat TSH pending.   Orders: -     TSH  Chronic low back pain with sciatica, sciatica laterality unspecified, unspecified back pain laterality Assessment & Plan: Stable. Continue Tylenol PRN.   GAD (generalized anxiety disorder) Assessment & Plan: Controlled.  Continue Lexapro 10 mg daily. Continue hydroxyzine 10 mg PRN   History of breast cancer Assessment & Plan: Mammogram UTD, due in December 2024   Mixed hyperlipidemia Assessment & Plan: Repeat lipid panel pending. Not on treatment.    Orders: -     Lipid panel  Iron deficiency anemia, unspecified iron deficiency anemia type Assessment & Plan: Repeat CBC and iron studies pending. Continue ferrous sulfate 325 mg daily.   Screening mammogram for breast cancer -     3D Screening Mammogram, Left and Right; Future  Hyperlipidemia, unspecified hyperlipidemia type Assessment & Plan: Repeat lipid panel pending. Not on treatment.           Doreene Nest, NP

## 2023-06-09 NOTE — Assessment & Plan Note (Signed)
Repeat CBC and iron studies pending.  Continue ferrous sulfate 325 mg daily.

## 2023-06-10 LAB — COMPREHENSIVE METABOLIC PANEL
ALT: 23 U/L (ref 0–35)
AST: 31 U/L (ref 0–37)
Albumin: 3.5 g/dL (ref 3.5–5.2)
Alkaline Phosphatase: 84 U/L (ref 39–117)
BUN: 25 mg/dL — ABNORMAL HIGH (ref 6–23)
CO2: 22 meq/L (ref 19–32)
Calcium: 9.7 mg/dL (ref 8.4–10.5)
Chloride: 103 meq/L (ref 96–112)
Creatinine, Ser: 1.66 mg/dL — ABNORMAL HIGH (ref 0.40–1.20)
GFR: 28.39 mL/min — ABNORMAL LOW (ref >60.00)
Glucose, Bld: 91 mg/dL (ref 70–99)
Potassium: 4.2 meq/L (ref 3.5–5.1)
Sodium: 135 meq/L (ref 135–145)
Total Bilirubin: 1.2 mg/dL (ref 0.2–1.2)
Total Protein: 6.8 g/dL (ref 6.0–8.3)

## 2023-06-10 LAB — CBC WITH DIFFERENTIAL/PLATELET
Basophils Absolute: 0 10*3/uL (ref 0.0–0.1)
Basophils Relative: 0.3 % (ref 0.0–3.0)
Eosinophils Absolute: 0.2 10*3/uL (ref 0.0–0.7)
Eosinophils Relative: 2.3 % (ref 0.0–5.0)
HCT: 37.9 % (ref 36.0–46.0)
Hemoglobin: 12.4 g/dL (ref 12.0–15.0)
Lymphocytes Relative: 14.2 % (ref 12.0–46.0)
Lymphs Abs: 1 10*3/uL (ref 0.7–4.0)
MCHC: 32.8 g/dL (ref 30.0–36.0)
MCV: 95.1 fl (ref 78.0–100.0)
Monocytes Absolute: 0.7 10*3/uL (ref 0.1–1.0)
Monocytes Relative: 10 % (ref 3.0–12.0)
Neutro Abs: 4.9 10*3/uL (ref 1.4–7.7)
Neutrophils Relative %: 73.2 % (ref 43.0–77.0)
Platelets: 137 10*3/uL — ABNORMAL LOW (ref 150.0–400.0)
RBC: 3.98 Mil/uL (ref 3.87–5.11)
RDW: 14.2 % (ref 11.5–15.5)
WBC: 6.8 10*3/uL (ref 4.0–10.5)

## 2023-06-10 LAB — LIPID PANEL
Cholesterol: 187 mg/dL (ref 0–200)
HDL: 52 mg/dL (ref >39.00)
LDL Cholesterol: 104 mg/dL — ABNORMAL HIGH (ref 0–99)
NonHDL: 134.65
Total CHOL/HDL Ratio: 4
Triglycerides: 153 mg/dL — ABNORMAL HIGH (ref 0.0–149.0)
VLDL: 30.6 mg/dL (ref 0.0–40.0)

## 2023-06-10 LAB — TSH: TSH: 3.18 u[IU]/mL (ref 0.35–5.50)

## 2023-06-12 ENCOUNTER — Telehealth: Payer: Self-pay | Admitting: Primary Care

## 2023-06-12 ENCOUNTER — Other Ambulatory Visit: Payer: Self-pay | Admitting: Primary Care

## 2023-06-12 DIAGNOSIS — I10 Essential (primary) hypertension: Secondary | ICD-10-CM

## 2023-06-12 NOTE — Telephone Encounter (Signed)
See result note for further documentation.

## 2023-06-12 NOTE — Telephone Encounter (Signed)
Patient called in returning call she received. Relayed lab result message to patient.   She stated that the water depends but she drinks plenty to stay hydrated. She stated that it keeps her going to the bathroom. She stated that she think she may have had some water prior to her appointment but don't remember for sure. She stated that she isn't taking any NSAIDs on a daily basis only if she need to and she isn't taking any new OTC or RX medications.

## 2023-06-22 ENCOUNTER — Other Ambulatory Visit: Payer: Self-pay | Admitting: Nurse Practitioner

## 2023-06-22 ENCOUNTER — Other Ambulatory Visit: Payer: Self-pay | Admitting: Primary Care

## 2023-06-22 DIAGNOSIS — K7460 Unspecified cirrhosis of liver: Secondary | ICD-10-CM

## 2023-06-22 DIAGNOSIS — I8510 Secondary esophageal varices without bleeding: Secondary | ICD-10-CM

## 2023-06-22 DIAGNOSIS — K7682 Hepatic encephalopathy: Secondary | ICD-10-CM | POA: Diagnosis not present

## 2023-06-22 DIAGNOSIS — R188 Other ascites: Secondary | ICD-10-CM | POA: Diagnosis not present

## 2023-06-22 DIAGNOSIS — I9589 Other hypotension: Secondary | ICD-10-CM | POA: Diagnosis not present

## 2023-06-22 DIAGNOSIS — F411 Generalized anxiety disorder: Secondary | ICD-10-CM

## 2023-06-22 DIAGNOSIS — K7581 Nonalcoholic steatohepatitis (NASH): Secondary | ICD-10-CM | POA: Diagnosis not present

## 2023-06-25 ENCOUNTER — Other Ambulatory Visit: Payer: Medicare HMO

## 2023-06-27 ENCOUNTER — Other Ambulatory Visit: Payer: Self-pay

## 2023-06-27 ENCOUNTER — Emergency Department (HOSPITAL_BASED_OUTPATIENT_CLINIC_OR_DEPARTMENT_OTHER)
Admission: EM | Admit: 2023-06-27 | Discharge: 2023-06-28 | Disposition: A | Discharge status: Home / Self Care | Payer: Medicare HMO | Attending: Emergency Medicine | Admitting: Emergency Medicine

## 2023-06-27 ENCOUNTER — Emergency Department (HOSPITAL_BASED_OUTPATIENT_CLINIC_OR_DEPARTMENT_OTHER): Payer: Medicare HMO

## 2023-06-27 DIAGNOSIS — Y92019 Unspecified place in single-family (private) house as the place of occurrence of the external cause: Secondary | ICD-10-CM | POA: Diagnosis not present

## 2023-06-27 DIAGNOSIS — W010XXA Fall on same level from slipping, tripping and stumbling without subsequent striking against object, initial encounter: Secondary | ICD-10-CM | POA: Insufficient documentation

## 2023-06-27 DIAGNOSIS — S0003XA Contusion of scalp, initial encounter: Secondary | ICD-10-CM | POA: Diagnosis not present

## 2023-06-27 DIAGNOSIS — S0101XA Laceration without foreign body of scalp, initial encounter: Secondary | ICD-10-CM

## 2023-06-27 DIAGNOSIS — I6782 Cerebral ischemia: Secondary | ICD-10-CM | POA: Diagnosis not present

## 2023-06-27 DIAGNOSIS — W19XXXA Unspecified fall, initial encounter: Secondary | ICD-10-CM

## 2023-06-27 NOTE — ED Provider Notes (Signed)
[place] EMERGENCY DEPARTMENT AT [facility] Provider Note   CSN: [id number] Arrival date & time: 06/27/23  2112     History  Chief Complaint  Patient presents with   [name] is a 82 y.o. female.  Patient has a history of falls.  Patient stood balance and fell backwards at home the fall occurred around 1600.  Patient was on the floor for a period time before family came home.  But less than 2 hours.  Patient was bleeding in the back of the head.  Patient denies any other injuries.  Patient has been able to ambulate since that time.  No nausea no vomiting.  Patient not on blood thinners.  Patient states tetanus is up-to-date.  Past medical history significant for hypertension hyperlipidemia hypothyroidism gastroesophageal reflux disease history of left breast cancer in 2005 treated with chemotherapy.  History of urinary tract infections.  Past surgical history sniffing for abdominal hysterectomy appendectomy breast lumpectomy.  Patient is former smoker quit in 1972.       Home Medications Prior to Admission medications   Medication Sig Start Date End Date Taking? Authorizing Provider  benzonatate (TESSALON) 100 MG capsule Take 1 capsule (100 mg total) by mouth 2 (two) times daily as needed for cough. Patient not taking: Reported on 06/09/2023 10/07/22   Eden Emms, NP  Cholecalciferol (VITAMIN D) 125 MCG (5000 UT) CAPS Take 125 mcg by mouth. Takes 1 per day    [provider]  Cranberry-Vitamin C-Vitamin E (CRANBERRY PLUS VITAMIN C) 4200-20-3 MG-MG-UNIT CAPS Take 4,200 mg by mouth. Takes 1 per day Patient not taking: Reported on 06/09/2023    [provider]  escitalopram (LEXAPRO) 10 MG tablet TAKE 1 TABLET (10 MG TOTAL) BY MOUTH DAILY. FOR ANXIETY. 06/22/23   Doreene Nest, NP  ferrous sulfate 325 (65 FE) MG tablet Take 325 mg by mouth daily with breakfast.    [provider]  fluticasone (FLONASE) 50 MCG/ACT nasal spray  Place 2 sprays into both nostrils daily. Patient not taking: Reported on 06/09/2023 10/07/22   Eden Emms, NP  furosemide (LASIX) 20 MG tablet Take 20 mg by mouth.    [provider]  hydrOXYzine (ATARAX/VISTARIL) 10 MG tablet TAKE 1 TABLET BY MOUTH EVERY DAY AS NEEDED FOR ANXIETY 05/24/19   Doreene Nest, NP  levothyroxine (SYNTHROID) 75 MCG tablet TAKE 1 TABLET BY MOUTH EVERY MORNING ON AN EMPTY STOMACH WITH WATER ONLY. NO FOOD OR OTHER MEDICATIONS FOR 30 MINUTES. 06/07/23   Doreene Nest, NP  losartan (COZAAR) 50 MG tablet TAKE 1 TABLET (50 MG TOTAL) BY MOUTH DAILY. FOR BLOOD PRESSURE. 04/05/23   Doreene Nest, NP  MULTIPLE VITAMIN PO Take by mouth. Patient states she takes 1 per day.    [provider]  omeprazole (PRILOSEC) 40 MG capsule TAKE 1 CAPSULE (40 MG TOTAL) BY MOUTH 2 (TWO) TIMES DAILY BEFORE A MEAL. 10/15/22   Cirigliano, Vito V, DO  spironolactone (ALDACTONE) 50 MG tablet Take 50 mg by mouth daily.    [provider]      Allergies    Ciprofloxacin, Codeine, and Morphine    Review of Systems   Review of Systems  Constitutional:  Negative for chills and fever.  HENT:  Negative for ear pain and sore throat.   Eyes:  Negative for pain and visual disturbance.  Respiratory:  Negative for cough and shortness of breath.   Cardiovascular:  Negative for  chest pain and palpitations.  Gastrointestinal:  Negative for abdominal pain and vomiting.  Genitourinary:  Negative for dysuria and hematuria.  Musculoskeletal:  Negative for arthralgias and back pain.  Skin:  Positive for wound. Negative for color change and rash.  Neurological:  Negative for seizures and syncope.  All other systems reviewed and are negative.   Physical Exam Updated Vital Signs BP (!) 109/58   Pulse 72   Temp 98.7 F (37.1 C) (Oral)   Resp 20   Wt 77.6 kg   SpO2 91%   BMI 28.47 kg/m  Physical Exam Vitals and nursing note reviewed.  Constitutional:      General:  She is not in acute distress.    Appearance: Normal appearance. She is well-developed.  HENT:     Head: Normocephalic.     Comments: 2 cm laceration to the posterior aspect of the head.  That is linear transverse.  Bleeding controlled. Eyes:     Extraocular Movements: Extraocular movements intact.     Conjunctiva/sclera: Conjunctivae normal.     Pupils: Pupils are equal, round, and reactive to light.  Neck:     Comments: No tenderness to palpation to posterior cervical spine. Cardiovascular:     Rate and Rhythm: Normal rate and regular rhythm.     Heart sounds: No murmur heard. Pulmonary:     Effort: Pulmonary effort is normal. No respiratory distress.     Breath sounds: Normal breath sounds.  Abdominal:     Palpations: Abdomen is soft.     Tenderness: There is no abdominal tenderness.  Musculoskeletal:        General: No swelling, tenderness, deformity or signs of injury.     Cervical back: Neck supple. No rigidity or tenderness.     Comments: Patient moves both lower extremities without any evidence of any hip pain no deformity no shortening no rotation.  Neurovascularly intact distally.  Patient with good movement of her upper extremities.  Neurovascularly intact.  No tenderness to palpation to thoracic or lumbar spine.  Skin:    General: Skin is warm and dry.     Capillary Refill: Capillary refill takes less than 2 seconds.  Neurological:     General: No focal deficit present.     Mental Status: She is alert and oriented to person, place, and time.     Cranial Nerves: No cranial nerve deficit.     Sensory: No sensory deficit.     Motor: No weakness.  Psychiatric:        Mood and Affect: Mood normal.     ED Results / Procedures / Treatments   Labs (all labs ordered are listed, but only abnormal results are displayed) Labs Reviewed - No data to display  EKG None  Radiology No results found.  Procedures .Marland KitchenLaceration Repair  Date/Time: 06/27/2023 10:40  PM  Performed by: Vanetta Mulders, MD Authorized by: Vanetta Mulders, MD   Consent:    Consent obtained:  Verbal   Consent given by:  Patient   Risks, benefits, and alternatives were discussed: yes     Risks discussed:  Infection and poor wound healing   Alternatives discussed:  No treatment Universal protocol:    Procedure explained and questions answered to patient or proxy's satisfaction: yes     Imaging studies available: yes     Immediately prior to procedure, a time out was called: yes     Patient identity confirmed:  Verbally with patient Anesthesia:    Anesthesia method:  None Laceration details:    Location:  Scalp   Scalp location:  Occipital   Length (cm):  2 Pre-procedure details:    Preparation:  Imaging obtained to evaluate for foreign bodies Exploration:    Limited defect created (wound extended): yes     Imaging outcome: foreign body not noted     Wound exploration: wound explored through full range of motion and entire depth of wound visualized     Contaminated: no   Treatment:    Area cleansed with:  Shur-Clens   Amount of cleaning:  Standard   Debridement:  None Skin repair:    Repair method:  Staples   Number of staples:  3 Approximation:    Approximation:  Loose Repair type:    Repair type:  Simple Post-procedure details:    Dressing:  Antibiotic ointment     Medications Ordered in ED Medications - No data to display  ED Course/ Medical Decision Making/ A&P                                 Medical Decision Making Amount and/or Complexity of Data Reviewed Radiology: ordered.   Patient status post for fall at home says she lost her balance and fell backwards.  2 cm laceration to the occipital area of the scalp.  This was closed with 3 stainless steel staples.  Patient states her tetanus is up-to-date.  Antibiotic ointment applied.  Patient will get CT head.  If negative patient is stable for discharge home.   Final Clinical  Impression(s) / ED Diagnoses Final diagnoses:  Fall, initial encounter  Laceration of scalp, initial encounter    Rx / DC Orders ED Discharge Orders     None         Vanetta Mulders, MD 06/27/23 2243

## 2023-06-27 NOTE — ED Triage Notes (Signed)
Pt to triage with assistance c/o head laceration resulting from GLF around 16:00 today. Bleeding controlled at this time. Pt presents with 3 cm lac to back head a/o x 4 PT denies blood thinners. PT husband states she was down on the floor from fall for approx 2 hours prior to being found. VSS NAD PT on room air.

## 2023-06-27 NOTE — Discharge Instructions (Addendum)
Able removal in 7 days.  Keep the area dry for 24 hours.  Then can wash with soap and water.  Can apply antibiotic ointment like Neosporin or bacitracin twice a day.  Return for any new or worse symptoms.

## 2023-07-02 ENCOUNTER — Other Ambulatory Visit: Payer: Self-pay | Admitting: Primary Care

## 2023-07-02 DIAGNOSIS — I10 Essential (primary) hypertension: Secondary | ICD-10-CM

## 2023-07-03 ENCOUNTER — Ambulatory Visit (INDEPENDENT_AMBULATORY_CARE_PROVIDER_SITE_OTHER): Payer: Medicare HMO | Admitting: Primary Care

## 2023-07-03 ENCOUNTER — Encounter: Payer: Self-pay | Admitting: Primary Care

## 2023-07-03 VITALS — BP 110/62 | HR 82 | Temp 98.6°F | Ht 65.0 in | Wt 175.0 lb

## 2023-07-03 DIAGNOSIS — S0101XS Laceration without foreign body of scalp, sequela: Secondary | ICD-10-CM | POA: Insufficient documentation

## 2023-07-03 DIAGNOSIS — Z9181 History of falling: Secondary | ICD-10-CM | POA: Insufficient documentation

## 2023-07-03 DIAGNOSIS — Z23 Encounter for immunization: Secondary | ICD-10-CM

## 2023-07-03 DIAGNOSIS — K7460 Unspecified cirrhosis of liver: Secondary | ICD-10-CM | POA: Diagnosis not present

## 2023-07-03 LAB — COMPREHENSIVE METABOLIC PANEL
ALT: 32 U/L (ref 0–35)
AST: 39 U/L — ABNORMAL HIGH (ref 0–37)
Albumin: 3 g/dL — ABNORMAL LOW (ref 3.5–5.2)
Alkaline Phosphatase: 115 U/L (ref 39–117)
BUN: 21 mg/dL (ref 6–23)
CO2: 21 meq/L (ref 19–32)
Calcium: 9.1 mg/dL (ref 8.4–10.5)
Chloride: 102 meq/L (ref 96–112)
Creatinine, Ser: 1.4 mg/dL — ABNORMAL HIGH (ref 0.40–1.20)
GFR: 34.81 mL/min — ABNORMAL LOW (ref >60.00)
Glucose, Bld: 132 mg/dL — ABNORMAL HIGH (ref 70–99)
Potassium: 4.4 meq/L (ref 3.5–5.1)
Sodium: 132 meq/L — ABNORMAL LOW (ref 135–145)
Total Bilirubin: 1.8 mg/dL — ABNORMAL HIGH (ref 0.2–1.2)
Total Protein: 7 g/dL (ref 6.0–8.3)

## 2023-07-03 LAB — PROTIME-INR
INR: 1.5 {ratio} — ABNORMAL HIGH (ref 0.8–1.0)
Prothrombin Time: 15.4 s — ABNORMAL HIGH (ref 9.6–13.1)

## 2023-07-03 NOTE — Addendum Note (Signed)
Addended by: Donnamarie Poag on: 07/03/2023 03:01 PM   Modules accepted: Orders

## 2023-07-03 NOTE — Assessment & Plan Note (Addendum)
With ED visit. ED notes and imaging reviewed.  3 staples removed today, patient tolerated well. No signs of infection or complication.  Recommended home health PT for which she kindly declines for now. Discussed to work on utilizing cane and walker at home.   Td vaccine provided today.

## 2023-07-03 NOTE — Progress Notes (Signed)
Subjective:    Patient ID: [name], female    DOB: Mar 02, 1941, 82 y.o.   MRN: [id number]  Suture / Staple Removal    [name] is a very pleasant 82 y.o. female with a history of hypertension, hypothyroidism, fibromyalgia, breast cancer, anemia, ascites, chronic back pain who presents today for ED follow up.  Her daughter joins [place] today.  She is also needing follow up labs for AKI per hepatologist.   She presented to the ED at [facility] on 06/27/23 after a fall that occurred a few hours prior. She was standing, lost her balance, fell backwards. Family found patient on the floor when they arrived home. She was noted to be bleeding in the back of her head.   During her stay in the ED she was noted to have a scalp laceration to the occipital lobe. Three staples were placed for closure. She completed a CT scan of the head which was negative for acute bleed or other abnormalities. She was discharged home later that evening.  Since her discharge home she's feeling better. She denies headaches, dizziness. She has a cane for which she doesn't use in the house.    Review of Systems  Eyes:  Negative for visual disturbance.  Cardiovascular:  Negative for chest pain.  Neurological:  Negative for dizziness and headaches.         Past Medical History:  Diagnosis Date   Acute non-recurrent maxillary sinusitis 05/17/2018   Acute non-recurrent sinusitis 05/17/2018   Adenocarcinoma, breast (HCC)    Anxiety    Colonic polyp    5 mm adenoma 1977   Degenerative joint disease    Fibromyalgia    Gastrointestinal hemorrhage with melena 05/28/2021   GERD (gastroesophageal reflux disease)    Hemorrhoids    History of anemia    History of shingles    Hyperlipidemia    Hypertension    Hypothyroidism    IBS (irritable bowel syndrome)    Malignant neoplasm of female breast (HCC) 10/21/2007   Formatting of this note might be different from the original. Invasive  mammillary carcinoma left breast     NASH (nonalcoholic steatohepatitis)    Osteopenia    Personal history of chemotherapy 2005   Left Breast Cancer   Personal history of radiation therapy 2005   Left Breast Cancer   UTI (urinary tract infection) 03/01/2015   Wears glasses     Social History   Socioeconomic History   Marital status: Married    Spouse name: Chrissie Noa   Number of children: 2   Years of education: Not on file   Highest education level: Not on file  Occupational History   Occupation: retired  Tobacco Use   Smoking status: Former    Current packs/day: 0.00    Average packs/day: 0.2 packs/day for 7.0 years (1.4 ttl pk-yrs)    Types: Cigarettes    Start date: 09/30/1963    Quit date: 09/29/1970    Years since quitting: 52.7   Smokeless tobacco: Never   Tobacco comments:    1 pack per week  Vaping Use   Vaping status: Never Used  Substance and Sexual Activity   Alcohol use: Not Currently   Drug use: No   Sexual activity: Yes  Other Topics Concern   Not on file  Social History Narrative   Would desire CPR   Social Determinants of Health   Financial Resource Strain: Low Risk  (10/21/2022)   Overall Financial Resource  Strain (CARDIA)    Difficulty of Paying Living Expenses: Not hard at all  Food Insecurity: No Food Insecurity (10/21/2022)   Hunger Vital Sign    Worried About Running Out of Food in the Last Year: Never true    Ran Out of Food in the Last Year: Never true  Transportation Needs: No Transportation Needs (10/21/2022)   PRAPARE - Administrator, Civil Service (Medical): No    Lack of Transportation (Non-Medical): No  Physical Activity: Insufficiently Active (10/21/2022)   Exercise Vital Sign    Days of Exercise per Week: 3 days    Minutes of Exercise per Session: 30 min  Stress: No Stress Concern Present (10/21/2022)   [employer] of Occupational Health - Occupational Stress Questionnaire    Feeling of Stress : Not at all  Social  Connections: Moderately Isolated (10/21/2022)   Social Connection and Isolation Panel [NHANES]    Frequency of Communication with Friends and Family: More than three times a week    Frequency of Social Gatherings with Friends and Family: More than three times a week    Attends Religious Services: Never    Database administrator or Organizations: No    Attends Banker Meetings: Never    Marital Status: Married  Catering manager Violence: Not At Risk (10/21/2022)   Humiliation, Afraid, Rape, and Kick questionnaire    Fear of Current or Ex-Partner: No    Emotionally Abused: No    Physically Abused: No    Sexually Abused: No    Past Surgical History:  Procedure Laterality Date   ABDOMINAL HYSTERECTOMY     APPENDECTOMY     BILATERAL SALPINGOOPHORECTOMY  1978   BREAST LUMPECTOMY Left 2005   left--node dissection 7/05 by Dr. Luan Moore   ESOPHAGOGASTRODUODENOSCOPY (EGD) WITH PROPOFOL N/A 06/05/2021   Procedure: ESOPHAGOGASTRODUODENOSCOPY (EGD) WITH PROPOFOL;  Surgeon: Shellia Cleverly, DO;  Location: [facility];  Service: Gastroenterology;  Laterality: N/A;   HEMOSTASIS CLIP PLACEMENT  06/05/2021   Procedure: HEMOSTASIS CLIP PLACEMENT;  Surgeon: Shellia Cleverly, DO;  Location: [facility];  Service: Gastroenterology;;   HOT HEMOSTASIS N/A 06/05/2021   Procedure: HOT HEMOSTASIS (ARGON PLASMA COAGULATION/BICAP);  Surgeon: Shellia Cleverly, DO;  Location: [facility];  Service: Gastroenterology;  Laterality: N/A;   LAPAROSCOPIC CHOLECYSTECTOMY     POLYPECTOMY  06/05/2021   Procedure: POLYPECTOMY;  Surgeon: Shellia Cleverly, DO;  Location: [facility];  Service: Gastroenterology;;   ROTATOR CUFF REPAIR     left    Family History  Problem Relation Age of Onset   Stroke Mother    Prostate cancer Father    Emphysema Sister    Crohn's disease Sister    Prostate cancer Brother    Aneurysm Brother    Liver disease Brother    Hypertension Brother     Allergies  Allergen  Reactions   Ciprofloxacin Diarrhea   Codeine     REACTION: nausea and vomiting   Morphine Nausea And Vomiting    Current Outpatient Medications on File Prior to Visit  Medication Sig Dispense Refill   Cholecalciferol (VITAMIN D) 125 MCG (5000 UT) CAPS Take 125 mcg by mouth. Takes 1 per day     escitalopram (LEXAPRO) 10 MG tablet TAKE 1 TABLET (10 MG TOTAL) BY MOUTH DAILY. FOR ANXIETY. 90 tablet 3   ferrous sulfate 325 (65 FE) MG tablet Take 325 mg by mouth daily with breakfast.     hydrOXYzine (ATARAX/VISTARIL) 10 MG tablet TAKE  1 TABLET BY MOUTH EVERY DAY AS NEEDED FOR ANXIETY 90 tablet 0   levothyroxine (SYNTHROID) 75 MCG tablet TAKE 1 TABLET BY MOUTH EVERY MORNING ON AN EMPTY STOMACH WITH WATER ONLY. NO FOOD OR OTHER MEDICATIONS FOR 30 MINUTES. 90 tablet 0   MULTIPLE VITAMIN PO Take by mouth. Patient states she takes 1 per day.     omeprazole (PRILOSEC) 40 MG capsule TAKE 1 CAPSULE (40 MG TOTAL) BY MOUTH 2 (TWO) TIMES DAILY BEFORE A MEAL. 180 capsule 2   benzonatate (TESSALON) 100 MG capsule Take 1 capsule (100 mg total) by mouth 2 (two) times daily as needed for cough. (Patient not taking: Reported on 06/09/2023) 20 capsule 0   Cranberry-Vitamin C-Vitamin E (CRANBERRY PLUS VITAMIN C) 4200-20-3 MG-MG-UNIT CAPS Take 4,200 mg by mouth. Takes 1 per day (Patient not taking: Reported on 06/09/2023)     fluticasone (FLONASE) 50 MCG/ACT nasal spray Place 2 sprays into both nostrils daily. (Patient not taking: Reported on 06/09/2023) 16 g 0   furosemide (LASIX) 20 MG tablet Take 20 mg by mouth. (Patient not taking: Reported on 07/03/2023)     losartan (COZAAR) 50 MG tablet TAKE 1 TABLET (50 MG TOTAL) BY MOUTH DAILY. FOR BLOOD PRESSURE. (Patient not taking: Reported on 07/03/2023) 90 tablet 2   spironolactone (ALDACTONE) 50 MG tablet Take 50 mg by mouth daily. (Patient not taking: Reported on 07/03/2023)     No current facility-administered medications on file prior to visit.    BP 110/62   Pulse 82    Temp 98.6 F (37 C) (Oral)   Ht 5\' 5"  (1.651 m)   Wt 175 lb (79.4 kg)   SpO2 98%   BMI 29.12 kg/m  Objective:   Physical Exam Eyes:     Extraocular Movements: Extraocular movements intact.  Cardiovascular:     Rate and Rhythm: Normal rate and regular rhythm.  Pulmonary:     Effort: Pulmonary effort is normal.     Breath sounds: Normal breath sounds.  Musculoskeletal:     Cervical back: Neck supple.  Skin:    General: Skin is warm and dry.     Comments: Closed laceration to right occipital lobe with 3 intact staples. No drainage or erythema.  Neurological:     Mental Status: She is alert and oriented to person, place, and time.     Cranial Nerves: No cranial nerve deficit.  Psychiatric:        Mood and Affect: Mood normal.           Assessment & Plan:  Cirrhosis of liver without ascites, unspecified hepatic cirrhosis type (HCC) -     Comprehensive metabolic panel -     Protime-INR  Scalp laceration, sequela Assessment & Plan: With ED visit. ED notes and imaging reviewed.  3 staples removed today, patient tolerated well. No signs of infection or complication.  Recommended home health PT for which she kindly declines for now. Discussed to work on utilizing cane and walker at home.   Td vaccine provided today.   Personal history of fall Assessment & Plan: With recent ED visit.  Recommended home health PT for which she kindly declines. Discussed to utilize cane/walker in the home.         Doreene Nest, NP

## 2023-07-03 NOTE — Patient Instructions (Signed)
Stop by the lab prior to leaving today. I will notify you of your results once received.   Think about home health physical therapy  It was a pleasure to see you today!

## 2023-07-03 NOTE — Assessment & Plan Note (Signed)
With recent ED visit.  Recommended home health PT for which she kindly declines. Discussed to utilize cane/walker in the home.

## 2023-07-07 ENCOUNTER — Other Ambulatory Visit: Payer: Self-pay | Admitting: Primary Care

## 2023-07-14 ENCOUNTER — Other Ambulatory Visit: Payer: Self-pay | Admitting: Gastroenterology

## 2023-07-22 ENCOUNTER — Emergency Department (HOSPITAL_COMMUNITY): Payer: Medicare HMO

## 2023-07-22 ENCOUNTER — Emergency Department (HOSPITAL_COMMUNITY)
Admission: EM | Admit: 2023-07-22 | Discharge: 2023-07-22 | Disposition: A | Discharge status: Home / Self Care | Payer: Medicare HMO | Attending: Emergency Medicine | Admitting: Emergency Medicine

## 2023-07-22 ENCOUNTER — Other Ambulatory Visit: Payer: Self-pay

## 2023-07-22 ENCOUNTER — Encounter (HOSPITAL_COMMUNITY): Payer: Self-pay

## 2023-07-22 ENCOUNTER — Other Ambulatory Visit: Payer: Medicare HMO

## 2023-07-22 DIAGNOSIS — R600 Localized edema: Secondary | ICD-10-CM | POA: Diagnosis not present

## 2023-07-22 DIAGNOSIS — R918 Other nonspecific abnormal finding of lung field: Secondary | ICD-10-CM | POA: Diagnosis not present

## 2023-07-22 DIAGNOSIS — Z20822 Contact with and (suspected) exposure to covid-19: Secondary | ICD-10-CM | POA: Diagnosis not present

## 2023-07-22 DIAGNOSIS — K310 Acute dilatation of stomach: Secondary | ICD-10-CM | POA: Diagnosis not present

## 2023-07-22 DIAGNOSIS — E876 Hypokalemia: Secondary | ICD-10-CM | POA: Diagnosis not present

## 2023-07-22 DIAGNOSIS — R051 Acute cough: Secondary | ICD-10-CM | POA: Diagnosis not present

## 2023-07-22 DIAGNOSIS — R188 Other ascites: Secondary | ICD-10-CM | POA: Diagnosis not present

## 2023-07-22 DIAGNOSIS — E039 Hypothyroidism, unspecified: Secondary | ICD-10-CM | POA: Diagnosis not present

## 2023-07-22 DIAGNOSIS — I10 Essential (primary) hypertension: Secondary | ICD-10-CM | POA: Diagnosis not present

## 2023-07-22 DIAGNOSIS — J90 Pleural effusion, not elsewhere classified: Secondary | ICD-10-CM | POA: Diagnosis not present

## 2023-07-22 DIAGNOSIS — R140 Abdominal distension (gaseous): Secondary | ICD-10-CM

## 2023-07-22 DIAGNOSIS — R059 Cough, unspecified: Secondary | ICD-10-CM | POA: Diagnosis not present

## 2023-07-22 LAB — COMPREHENSIVE METABOLIC PANEL
ALT: 19 U/L (ref 0–44)
AST: 31 U/L (ref 15–41)
Albumin: 2 g/dL — ABNORMAL LOW (ref 3.5–5.0)
Alkaline Phosphatase: 90 U/L (ref 38–126)
Anion gap: 4 — ABNORMAL LOW (ref 5–15)
BUN: 17 mg/dL (ref 8–23)
CO2: 17 mmol/L — ABNORMAL LOW (ref 22–32)
Calcium: 7.1 mg/dL — ABNORMAL LOW (ref 8.9–10.3)
Chloride: 114 mmol/L — ABNORMAL HIGH (ref 98–111)
Creatinine, Ser: 1 mg/dL (ref 0.44–1.00)
GFR, Estimated: 56 mL/min — ABNORMAL LOW (ref >60)
Glucose, Bld: 91 mg/dL (ref 70–99)
Potassium: 3.2 mmol/L — ABNORMAL LOW (ref 3.5–5.1)
Sodium: 135 mmol/L (ref 135–145)
Total Bilirubin: 1.2 mg/dL (ref 0.3–1.2)
Total Protein: 5.8 g/dL — ABNORMAL LOW (ref 6.5–8.1)

## 2023-07-22 LAB — LIPASE, BLOOD: Lipase: 46 U/L (ref 11–51)

## 2023-07-22 LAB — CBC WITH DIFFERENTIAL/PLATELET
Abs Immature Granulocytes: 0.06 10*3/uL (ref 0.00–0.07)
Basophils Absolute: 0.1 10*3/uL (ref 0.0–0.1)
Basophils Relative: 1 %
Eosinophils Absolute: 0.2 10*3/uL (ref 0.0–0.5)
Eosinophils Relative: 2 %
HCT: 36.7 % (ref 36.0–46.0)
Hemoglobin: 12.1 g/dL (ref 12.0–15.0)
Immature Granulocytes: 1 %
Lymphocytes Relative: 12 %
Lymphs Abs: 1.1 10*3/uL (ref 0.7–4.0)
MCH: 30.4 pg (ref 26.0–34.0)
MCHC: 33 g/dL (ref 30.0–36.0)
MCV: 92.2 fL (ref 80.0–100.0)
Monocytes Absolute: 1 10*3/uL (ref 0.1–1.0)
Monocytes Relative: 11 %
Neutro Abs: 6.9 10*3/uL (ref 1.7–7.7)
Neutrophils Relative %: 73 %
Platelets: 216 10*3/uL (ref 150–400)
RBC: 3.98 MIL/uL (ref 3.87–5.11)
RDW: 14.3 % (ref 11.5–15.5)
WBC: 9.5 10*3/uL (ref 4.0–10.5)
nRBC: 0 % (ref 0.0–0.2)

## 2023-07-22 LAB — GRAM STAIN

## 2023-07-22 LAB — BODY FLUID CELL COUNT WITH DIFFERENTIAL
Eos, Fluid: 0 %
Lymphs, Fluid: 29 %
Monocyte-Macrophage-Serous Fluid: 62 % (ref 50–90)
Neutrophil Count, Fluid: 9 % (ref 0–25)
Other Cells, Fluid: ABNORMAL %
Total Nucleated Cell Count, Fluid: 111 uL (ref 0–1000)

## 2023-07-22 LAB — PROTIME-INR
INR: 1.5 — ABNORMAL HIGH (ref 0.8–1.2)
Prothrombin Time: 18.7 s — ABNORMAL HIGH (ref 11.4–15.2)

## 2023-07-22 LAB — SARS CORONAVIRUS 2 BY RT PCR: SARS Coronavirus 2 by RT PCR: NEGATIVE

## 2023-07-22 LAB — MAGNESIUM: Magnesium: 1.5 mg/dL — ABNORMAL LOW (ref 1.7–2.4)

## 2023-07-22 LAB — AMMONIA: Ammonia: 16 umol/L (ref 9–35)

## 2023-07-22 MED ORDER — BENZONATATE 100 MG PO CAPS: 100.0000 mg | ORAL_CAPSULE | Freq: Three times a day (TID) | ORAL | 0 refills | Status: DC

## 2023-07-22 MED ORDER — POTASSIUM CHLORIDE CRYS ER 20 MEQ PO TBCR
20.0000 meq | EXTENDED_RELEASE_TABLET | Freq: Once | ORAL | Status: AC
  Administered 2023-07-22: 20 meq via ORAL
  Filled 2023-07-22: qty 1

## 2023-07-22 MED ORDER — MAGNESIUM OXIDE -MG SUPPLEMENT 400 (240 MG) MG PO TABS
400.0000 mg | ORAL_TABLET | Freq: Once | ORAL | Status: AC
  Administered 2023-07-22: 400 mg via ORAL
  Filled 2023-07-22: qty 1

## 2023-07-22 MED ORDER — LIDOCAINE HCL 1 % IJ SOLN
INTRAMUSCULAR | Status: AC
  Filled 2023-07-22: qty 20

## 2023-07-22 NOTE — Procedures (Incomplete)
Patient ID: [name], female   DOB: [birth year], 82 y.o.   MRN: [id number] PROCEDURE SUMMARY:  Successful image-guided paracentesis from the RLQ abdomen.  Yielded 3.6  liters of Clear Yellow fluid.  No immediate complications.  EBL: < 2 cc's Patient tolerated well.   Specimen was sent for labs.  Please see imaging section of Epic for full dictation.  Ardith Dark PA-C 07/22/2023 5:18 PM

## 2023-07-22 NOTE — ED Provider Notes (Signed)
[place] EMERGENCY DEPARTMENT AT [facility] Provider Note   CSN: [id number] Arrival date & time: 07/22/23  1155     History  Chief Complaint  Patient presents with   Abdominal Pain   Cough    Dry    [name] is a 82 y.o. female with a past medical history significant for hypertension, hypothyroidism, hyperlipidemia, irritable bowel syndrome, NASH, and fibromyalgia who presents to the ED due to persistent cough and abdominal distention x 3 to 4 weeks.  Cough is dry in nature.  No fever or chills.  Denies chest pain and shortness of breath.  Patient states she gets in coughing fits because her "mouth is dry".  Cough typically resolves when drinking water.  Patient also admits to abdominal distention for the past 3 to 4 weeks.  Has had a 10 pound weight gain over the past 3 weeks.  Also admits to some swelling in her bilateral feet. History of NASH. No confusion per daughter at bedside. Was advised by her liver doctor to report to the ED for further evaluation.  Patient has never had a paracentesis previously. Believes her cough is related to her abdominal distention.  History obtained from patient and past medical records. No interpreter used during encounter.       Home Medications Prior to Admission medications   Medication Sig Start Date End Date Taking? Authorizing Provider  benzonatate (TESSALON) 100 MG capsule Take 1 capsule (100 mg total) by mouth 2 (two) times daily as needed for cough. 10/07/22  Yes Eden Emms, NP  benzonatate (TESSALON) 100 MG capsule Take 1 capsule (100 mg total) by mouth every 8 (eight) hours. 07/22/23  Yes Ernesto Zukowski, Merla Riches, PA-C  CALCIUM PO Take 1 tablet by mouth at bedtime.   Yes [provider]  Cholecalciferol (VITAMIN D3) 125 MCG (5000 UT) TABS Take 5,000 Units by mouth at bedtime.   Yes [provider]  escitalopram (LEXAPRO) 10 MG tablet TAKE 1 TABLET (10 MG TOTAL) BY MOUTH DAILY. FOR  ANXIETY. Patient taking differently: Take 10 mg by mouth See admin instructions. Take 10 mg by mouth in the morning (for anxiety) 06/22/23  Yes Doreene Nest, NP  ferrous sulfate 325 (65 FE) MG tablet Take 325 mg by mouth at bedtime.   Yes [provider]  hydrOXYzine (ATARAX/VISTARIL) 10 MG tablet TAKE 1 TABLET BY MOUTH EVERY DAY AS NEEDED FOR ANXIETY Patient taking differently: Take 10 mg by mouth daily as needed for anxiety. 05/24/19  Yes Doreene Nest, NP  levothyroxine (SYNTHROID) 75 MCG tablet TAKE 1 TABLET BY MOUTH EVERY MORNING ON AN EMPTY STOMACH WITH WATER ONLY. NO FOOD OR OTHER MEDICATIONS FOR 30 MINUTES. Patient taking differently: Take 75 mcg by mouth See admin instructions. Take 75 mcg (1 tablet) by mouth every morning on an empty stomach with water only.  No food or other medications for 30 minutes. 06/07/23  Yes Doreene Nest, NP  Multiple Vitamins-Calcium (ONE-A-DAY WOMENS FORMULA) TABS Take 1 tablet by mouth at bedtime.   Yes [provider]  omeprazole (PRILOSEC) 40 MG capsule Take 1 capsule (40 mg total) by mouth 2 (two) times daily before a meal. Patient needs follow up appointment for future refills. Please call [phone] to schedule an appointment. Patient taking differently: Take 40 mg by mouth in the morning and at bedtime. 07/14/23  Yes Cirigliano, Vito V, DO  fluticasone (FLONASE) 50 MCG/ACT nasal spray Place 2 sprays into both nostrils daily. Patient  not taking: Reported on 07/22/2023 10/07/22   Eden Emms, NP  midodrine (PROAMATINE) 5 MG tablet Take 5 mg by mouth 3 (three) times daily with meals. Patient not taking: Reported on 07/22/2023    [provider]      Allergies    Ciprofloxacin, Codeine, and Morphine    Review of Systems   Review of Systems  Constitutional:  Negative for chills and fever.  Respiratory:  Positive for cough. Negative for shortness of breath.   Cardiovascular:  Positive for leg swelling. Negative  for chest pain.  Gastrointestinal:  Positive for abdominal distention and diarrhea (chronic). Negative for nausea and vomiting.    Physical Exam Updated Vital Signs BP 136/73 (BP Location: Right Arm)   Pulse 66   Temp 97.6 F (36.4 C) (Oral)   Resp 17   SpO2 94%  Physical Exam Vitals and nursing note reviewed.  Constitutional:      General: She is not in acute distress.    Appearance: She is not ill-appearing.  HENT:     Head: Normocephalic.  Eyes:     Pupils: Pupils are equal, round, and reactive to light.  Cardiovascular:     Rate and Rhythm: Normal rate and regular rhythm.     Pulses: Normal pulses.     Heart sounds: Normal heart sounds. No murmur heard.    No friction rub. No gallop.  Pulmonary:     Effort: Pulmonary effort is normal.     Breath sounds: Normal breath sounds.  Abdominal:     General: Abdomen is flat. There is distension.     Palpations: Abdomen is soft.     Tenderness: There is no abdominal tenderness. There is no guarding or rebound.  Musculoskeletal:        General: Normal range of motion.     Cervical back: Neck supple.     Comments: Edema to bilateral feet  Skin:    General: Skin is warm and dry.  Neurological:     General: No focal deficit present.     Mental Status: She is alert.  Psychiatric:        Mood and Affect: Mood normal.        Behavior: Behavior normal.     ED Results / Procedures / Treatments   Labs (all labs ordered are listed, but only abnormal results are displayed) Labs Reviewed  PROTIME-INR - Abnormal; Notable for the following components:      Result Value   Prothrombin Time 18.7 (*)    INR 1.5 (*)    All other components within normal limits  COMPREHENSIVE METABOLIC PANEL - Abnormal; Notable for the following components:   Potassium 3.2 (*)    Chloride 114 (*)    CO2 17 (*)    Calcium 7.1 (*)    Total Protein 5.8 (*)    Albumin 2.0 (*)    GFR, Estimated 56 (*)    Anion gap 4 (*)    All other components  within normal limits  MAGNESIUM - Abnormal; Notable for the following components:   Magnesium 1.5 (*)    All other components within normal limits  BODY FLUID CELL COUNT WITH DIFFERENTIAL - Abnormal; Notable for the following components:   Color, Fluid YELLOW (*)    All other components within normal limits  SARS CORONAVIRUS 2 BY RT PCR  GRAM STAIN  CULTURE, BODY FLUID W GRAM STAIN -BOTTLE  CBC WITH DIFFERENTIAL/PLATELET  AMMONIA  LIPASE, BLOOD  URINALYSIS, ROUTINE W  REFLEX MICROSCOPIC  PATHOLOGIST SMEAR REVIEW    EKG EKG Interpretation Date/Time:  Wednesday July 22 2023 15:52:24 EDT Ventricular Rate:  70 PR Interval:  223 QRS Duration:  94 QT Interval:  436 QTC Calculation: 471 R Axis:   5  Text Interpretation: Sinus rhythm Prolonged PR interval Low voltage, precordial leads Borderline T abnormalities, anterior leads Baseline wander in lead(s) V1 Confirmed by Alvester Chou [phone]) on 07/22/2023 7:32:46 PM  Radiology DG Chest Portable 1 View  Result Date: 07/22/2023 CLINICAL DATA:  Cough EXAM: PORTABLE CHEST 1 VIEW COMPARISON:  Chest x-ray 01/19/2012 FINDINGS: There is a moderate-sized left pleural effusion. Can not exclude left basilar atelectasis or airspace disease. The right lung is clear. There is no pneumothorax. The cardiomediastinal silhouette is within normal limits for projection. No acute fractures are seen. IMPRESSION: Moderate-sized left pleural effusion. Can not exclude left basilar atelectasis or airspace disease. Electronically Signed   By: Darliss Cheney M.D.   On: 07/22/2023 16:15    Procedures Procedures    Medications Ordered in ED Medications  magnesium oxide (MAG-OX) tablet 400 mg (400 mg Oral Given 07/22/23 1843)  potassium chloride SA (KLOR-CON M) CR tablet 20 mEq (20 mEq Oral Given 07/22/23 1844)    ED Course/ Medical Decision Making/ A&P Clinical Course as of 07/22/23 2010  Wed Jul 22, 2023  1327 WBC: 9.5 [CA]  1342 Spoke to Dr. Fredia Sorrow  with IR who notes patient can get paracentesis today. Order placed. Given this is patient's first paracentesis, will send some fluid to the lab. [CA]    Clinical Course User Index [CA] Mannie Stabile, PA-C                                 Medical Decision Making Amount and/or Complexity of Data Reviewed Independent Historian: caregiver External Data Reviewed: notes.    Details: Liver notes Labs: ordered. Decision-making details documented in ED Course. Radiology: ordered and independent interpretation performed. Decision-making details documented in ED Course. ECG/medicine tests: ordered and independent interpretation performed. Decision-making details documented in ED Course.  Risk OTC drugs. Prescription drug management.   This patient presents to the ED for concern of abdominal distention, this involves an extensive number of treatment options, and is a complaint that carries with it a high risk of complications and morbidity.  The differential diagnosis includes ascites, bowel obstruction, malignancy, etc  82 year old female with history of Elita Boone presents to the ED due to abdominal distention and dry cough x 3 to 4 weeks.  Has had a 10 pound weight gain over that period.  No confusion per daughter at bedside.  Upon arrival patient afebrile, not tachycardic or hypoxic.  Patient in no acute distress.  Well-appearing on exam.  Significant abdominal distention on exam.  Abdomen nontender. Low suspicion for acute abdomen. Lungs clear to auscultation bilaterally.  Routine labs ordered. Patient will likely need paracenteses due to ascites, so will speak to IR after labs. Discussed with Dr. Suezanne Jacquet who agrees with assessment and plan. Low suspicion for SBP.  CBC unremarkable.  No leukocytosis.  Normal hemoglobin.  CMP with hypokalemia 3.2. potassium repleted. Normal renal function.  Normal LFTs.  Albumin at 2.  PT and INR at baseline.  Magnesium low at 1.5. Magnesium repleted. Lipase normal.   Ammonia normal.  COVID-negative.  Chest x-ray personally reviewed and interpreted which demonstrates a moderate size left pleural effusion. Normal O2 saturation on room air. Informed  patient of these results and advised patient to follow-up with liver doctor in the outpatient setting. May need thoracentesis if patient develops shortness of breath. No shortness of breath now.   IR performed successful paracentesis. Fluid sent for analysis.   Reassessed patient after paracentesis.  Patient has significant improvement in abdominal distention.  Paracentesis fluid negative for evidence of SBP.  Advised patient to follow-up with PCP or liver doctor in regards to pleural effusion. Patient able to ambulate with o2 saturation above 95% on room air without difficulty. Patient stable for discharge. Strict ED precautions discussed with patient. Patient states understanding and agrees to plan. Patient discharged home in no acute distress and stable vitals  Co morbidities that complicate the patient evaluation  NASH Cardiac Monitoring: / EKG:  The patient was maintained on a cardiac monitor.  I personally viewed and interpreted the cardiac monitored which showed an underlying rhythm of: NSR  Social Determinants of Health:  Chronic pain  Test / Admission - Considered:  CT abdomen; however abdomen non-tender, low suspicion for acute abdomen.         Final Clinical Impression(s) / ED Diagnoses Final diagnoses:  Acute cough  Abdominal distention  Pleural effusion    Rx / DC Orders ED Discharge Orders          Ordered    benzonatate (TESSALON) 100 MG capsule  Every 8 hours        07/22/23 2007              Jesusita Oka 07/22/23 2010    Lonell Grandchild, MD 07/23/23 [phone]

## 2023-07-22 NOTE — Discharge Instructions (Addendum)
It was a pleasure taking care of you today.  As discussed, you had fluid drained from your abdomen while you were in the ER.  No evidence of infection.  Your lung showed some fluid.  Please follow-up with PCP or liver doctor for further evaluation.  I am sending you home cough medication.  Take as needed for cough.  Return to the ER for any worsening symptoms.

## 2023-07-22 NOTE — ED Triage Notes (Signed)
Pt called Liver doctor Monday about a dry consistent cough the pt has had for 3 weeks. Doc told pt to come to ED if s/s do not relieve. Pt c/o cough, abdominal pain. Pt is also concerned about any fluid build up in abdomen. Hx of liver cancer. Pt state weigh gain of 10lbs in 3-4 weeks. Pt hard of hearing.

## 2023-07-27 LAB — CULTURE, BODY FLUID W GRAM STAIN -BOTTLE: Culture: NO GROWTH

## 2023-07-28 LAB — PATHOLOGIST SMEAR REVIEW

## 2023-07-30 ENCOUNTER — Other Ambulatory Visit (HOSPITAL_COMMUNITY): Payer: Self-pay | Admitting: Nurse Practitioner

## 2023-07-30 DIAGNOSIS — J918 Pleural effusion in other conditions classified elsewhere: Secondary | ICD-10-CM

## 2023-07-31 ENCOUNTER — Ambulatory Visit (HOSPITAL_COMMUNITY)
Admission: RE | Admit: 2023-07-31 | Discharge: 2023-07-31 | Disposition: A | Discharge status: Home / Self Care | Payer: Medicare HMO | Source: Ambulatory Visit | Attending: Nurse Practitioner | Admitting: Nurse Practitioner

## 2023-07-31 ENCOUNTER — Other Ambulatory Visit (HOSPITAL_COMMUNITY): Payer: Self-pay | Admitting: Student

## 2023-07-31 ENCOUNTER — Ambulatory Visit (HOSPITAL_COMMUNITY)
Admission: RE | Admit: 2023-07-31 | Discharge: 2023-07-31 | Disposition: A | Discharge status: Home / Self Care | Payer: Medicare HMO | Source: Ambulatory Visit | Attending: Student | Admitting: Student

## 2023-07-31 DIAGNOSIS — J90 Pleural effusion, not elsewhere classified: Secondary | ICD-10-CM

## 2023-07-31 DIAGNOSIS — J9811 Atelectasis: Secondary | ICD-10-CM | POA: Diagnosis not present

## 2023-07-31 DIAGNOSIS — Z48813 Encounter for surgical aftercare following surgery on the respiratory system: Secondary | ICD-10-CM | POA: Diagnosis not present

## 2023-07-31 DIAGNOSIS — J918 Pleural effusion in other conditions classified elsewhere: Secondary | ICD-10-CM

## 2023-07-31 HISTORY — PX: IR THORACENTESIS ASP PLEURAL SPACE W/IMG GUIDE: IMG5380

## 2023-07-31 LAB — BODY FLUID CELL COUNT WITH DIFFERENTIAL
Eos, Fluid: 0 %
Lymphs, Fluid: 44 %
Monocyte-Macrophage-Serous Fluid: 49 % — ABNORMAL LOW (ref 50–90)
Neutrophil Count, Fluid: 7 % (ref 0–25)
Total Nucleated Cell Count, Fluid: 170 uL (ref 0–1000)

## 2023-07-31 LAB — PROTEIN, PLEURAL OR PERITONEAL FLUID: Total protein, fluid: <3 g/dL

## 2023-07-31 LAB — GRAM STAIN

## 2023-07-31 LAB — GLUCOSE, PLEURAL OR PERITONEAL FLUID: Glucose, Fluid: 123 mg/dL

## 2023-07-31 MED ORDER — LIDOCAINE HCL 1 % IJ SOLN
INTRAMUSCULAR | Status: AC
  Filled 2023-07-31: qty 20

## 2023-07-31 MED ORDER — LIDOCAINE HCL 1 % IJ SOLN
20.0000 mL | Freq: Once | INTRAMUSCULAR | Status: AC
  Administered 2023-07-31: 20 mL via INTRADERMAL

## 2023-07-31 NOTE — Procedures (Signed)
PROCEDURE SUMMARY:  Successful US guided left thoracentesis. Yielded 1.3 L of clear yellow fluid. Pt tolerated procedure well. No immediate complications.  Specimen sent for labs. CXR ordered; no post-procedure pneumothorax identified.   EBL < 2 mL  Mickie Kay, NP 07/31/2023 3:48 PM

## 2023-08-04 DIAGNOSIS — J918 Pleural effusion in other conditions classified elsewhere: Secondary | ICD-10-CM

## 2023-08-04 LAB — CYTOLOGY - NON PAP

## 2023-08-05 ENCOUNTER — Ambulatory Visit
Admission: RE | Admit: 2023-08-05 | Discharge: 2023-08-05 | Disposition: A | Discharge status: Home / Self Care | Payer: Medicare HMO | Source: Ambulatory Visit | Attending: Nurse Practitioner | Admitting: Nurse Practitioner

## 2023-08-05 DIAGNOSIS — K7460 Unspecified cirrhosis of liver: Secondary | ICD-10-CM

## 2023-08-05 DIAGNOSIS — R188 Other ascites: Secondary | ICD-10-CM

## 2023-08-05 DIAGNOSIS — I8510 Secondary esophageal varices without bleeding: Secondary | ICD-10-CM

## 2023-08-05 DIAGNOSIS — J918 Pleural effusion in other conditions classified elsewhere: Secondary | ICD-10-CM | POA: Diagnosis not present

## 2023-08-05 DIAGNOSIS — Z9049 Acquired absence of other specified parts of digestive tract: Secondary | ICD-10-CM | POA: Diagnosis not present

## 2023-08-05 DIAGNOSIS — K769 Liver disease, unspecified: Secondary | ICD-10-CM | POA: Diagnosis not present

## 2023-08-05 LAB — CULTURE, BODY FLUID W GRAM STAIN -BOTTLE: Culture: NO GROWTH

## 2023-08-05 MED ORDER — BENZONATATE 200 MG PO CAPS: 200.0000 mg | ORAL_CAPSULE | Freq: Three times a day (TID) | ORAL | 0 refills | Status: DC | PRN

## 2023-08-25 ENCOUNTER — Ambulatory Visit (INDEPENDENT_AMBULATORY_CARE_PROVIDER_SITE_OTHER)
Admission: RE | Admit: 2023-08-25 | Discharge: 2023-08-25 | Disposition: A | Discharge status: Home / Self Care | Payer: Medicare HMO | Source: Ambulatory Visit | Attending: Primary Care

## 2023-08-25 ENCOUNTER — Encounter: Payer: Self-pay | Admitting: Primary Care

## 2023-08-25 ENCOUNTER — Ambulatory Visit: Payer: Medicare HMO | Admitting: Primary Care

## 2023-08-25 ENCOUNTER — Ambulatory Visit
Admission: RE | Admit: 2023-08-25 | Discharge: 2023-08-25 | Disposition: A | Discharge status: Home / Self Care | Payer: Medicare HMO | Source: Ambulatory Visit | Attending: Primary Care | Admitting: Primary Care

## 2023-08-25 VITALS — BP 132/76 | HR 78 | Temp 97.3°F | Ht 65.0 in | Wt 181.0 lb

## 2023-08-25 DIAGNOSIS — R059 Cough, unspecified: Secondary | ICD-10-CM | POA: Diagnosis not present

## 2023-08-25 DIAGNOSIS — J918 Pleural effusion in other conditions classified elsewhere: Secondary | ICD-10-CM

## 2023-08-25 DIAGNOSIS — K7581 Nonalcoholic steatohepatitis (NASH): Secondary | ICD-10-CM | POA: Diagnosis not present

## 2023-08-25 DIAGNOSIS — R296 Repeated falls: Secondary | ICD-10-CM | POA: Insufficient documentation

## 2023-08-25 DIAGNOSIS — R188 Other ascites: Secondary | ICD-10-CM

## 2023-08-25 DIAGNOSIS — R918 Other nonspecific abnormal finding of lung field: Secondary | ICD-10-CM | POA: Diagnosis not present

## 2023-08-25 DIAGNOSIS — K7460 Unspecified cirrhosis of liver: Secondary | ICD-10-CM | POA: Diagnosis not present

## 2023-08-25 DIAGNOSIS — K769 Liver disease, unspecified: Secondary | ICD-10-CM

## 2023-08-25 DIAGNOSIS — J90 Pleural effusion, not elsewhere classified: Secondary | ICD-10-CM | POA: Diagnosis not present

## 2023-08-25 MED ORDER — LIDOCAINE HCL (PF) 1 % IJ SOLN
10.0000 mL | Freq: Once | INTRAMUSCULAR | Status: AC
  Administered 2023-08-25: 10 mL via INTRADERMAL
  Filled 2023-08-25: qty 10

## 2023-08-25 NOTE — Progress Notes (Signed)
Subjective:    Patient ID: [name], female    DOB: [birth year], 82 y.o.   MRN: [id number]  HPI  [name] is a very pleasant 82 y.o. female with a history of heart tension, AVM, esophageal varices, hepatic Stata ptosis, cirrhosis of the liver, hypothyroidism, abdominal ascites, pulmonary effusion who presents today requesting home health services.   Her daughter reached out to [place] via MyChart last week requesting home health services to help monitor blood pressure and liver cirrhosis.  She is here today to discuss.  Her other daughter accompanies her today.  Follows with hepatology through [facility], last office visit was in September 2024.  During this visit she was advised to stop losartan and start Xifaxan antibiotics.  She underwent paracentesis in October 2024 with removal of 3.6 L of peritoneal fluid.  She underwent thoracentesis in November 2024 with removal of 1.3 L of pleural fluid.  Her family is requesting home health nursing to help keep an eye on her fluid status given her advancing cirrhosis. She is also needing physical therapy for generalized weakness and imbalance. She is falling at least once weekly, loses balance. She does have a walker and cane.  She does not weigh herself daily. Over the last several weeks she's noticed increased abdominal distention. Her chronic cough has continued which is worse at night. Her pulse oxygen levels are running 93% on room air most of the time. She has contacted her hepatology provider several times regarding her symptoms and has been referred to the ED.   She denies fevers, chills, body aches. She does experience nausea with vomiting in the evening, "I can feel the fluid come up". She is compliant to omeprazole 40 mg BID.  Wt Readings from Last 3 Encounters:  08/25/23 181 lb (82.1 kg)  07/03/23 175 lb (79.4 kg)  06/27/23 171 lb 1.2 oz (77.6 kg)   BP Readings from Last 3 Encounters:  08/25/23 132/76  07/22/23 136/73   07/03/23 110/62      Review of Systems  Constitutional:  Negative for chills and fever.  Respiratory:  Positive for cough.   Cardiovascular:  Negative for chest pain.  Gastrointestinal:  Positive for nausea.  Neurological:  Negative for dizziness and headaches.         Past Medical History:  Diagnosis Date   Acute non-recurrent maxillary sinusitis 05/17/2018   Acute non-recurrent sinusitis 05/17/2018   Adenocarcinoma, breast (HCC)    Anxiety    Colonic polyp    5 mm adenoma 1977   Degenerative joint disease    Fibromyalgia    Gastrointestinal hemorrhage with melena 05/28/2021   GERD (gastroesophageal reflux disease)    Hemorrhoids    History of anemia    History of shingles    Hyperlipidemia    Hypertension    Hypothyroidism    IBS (irritable bowel syndrome)    Malignant neoplasm of female breast (HCC) 10/21/2007   Formatting of this note might be different from the original. Invasive mammillary carcinoma left breast     NASH (nonalcoholic steatohepatitis)    Osteopenia    Personal history of chemotherapy 2005   Left Breast Cancer   Personal history of radiation therapy 2005   Left Breast Cancer   UTI (urinary tract infection) 03/01/2015   Wears glasses     Social History   Socioeconomic History   Marital status: Married    Spouse name: Chrissie Noa   Number of children: 2   Years of education:  Not on file   Highest education level: Not on file  Occupational History   Occupation: retired  Tobacco Use   Smoking status: Former    Current packs/day: 0.00    Average packs/day: 0.2 packs/day for 7.0 years (1.4 ttl pk-yrs)    Types: Cigarettes    Start date: 09/30/1963    Quit date: 09/29/1970    Years since quitting: 52.9   Smokeless tobacco: Never   Tobacco comments:    1 pack per week  Vaping Use   Vaping status: Never Used  Substance and Sexual Activity   Alcohol use: Not Currently   Drug use: No   Sexual activity: Yes  Other Topics Concern   Not on  file  Social History Narrative   Would desire CPR   Social Determinants of Health   Financial Resource Strain: Low Risk  (10/21/2022)   Overall Financial Resource Strain (CARDIA)    Difficulty of Paying Living Expenses: Not hard at all  Food Insecurity: No Food Insecurity (10/21/2022)   Hunger Vital Sign    Worried About Running Out of Food in the Last Year: Never true    Ran Out of Food in the Last Year: Never true  Transportation Needs: No Transportation Needs (10/21/2022)   PRAPARE - Administrator, Civil Service (Medical): No    Lack of Transportation (Non-Medical): No  Physical Activity: Insufficiently Active (10/21/2022)   Exercise Vital Sign    Days of Exercise per Week: 3 days    Minutes of Exercise per Session: 30 min  Stress: No Stress Concern Present (10/21/2022)   [employer] of Occupational Health - Occupational Stress Questionnaire    Feeling of Stress : Not at all  Social Connections: Moderately Isolated (10/21/2022)   Social Connection and Isolation Panel [NHANES]    Frequency of Communication with Friends and Family: More than three times a week    Frequency of Social Gatherings with Friends and Family: More than three times a week    Attends Religious Services: Never    Database administrator or Organizations: No    Attends Banker Meetings: Never    Marital Status: Married  Catering manager Violence: Not At Risk (10/21/2022)   Humiliation, Afraid, Rape, and Kick questionnaire    Fear of Current or Ex-Partner: No    Emotionally Abused: No    Physically Abused: No    Sexually Abused: No    Past Surgical History:  Procedure Laterality Date   ABDOMINAL HYSTERECTOMY     APPENDECTOMY     BILATERAL SALPINGOOPHORECTOMY  1978   BREAST LUMPECTOMY Left 2005   left--node dissection 7/05 by Dr. Luan Moore   ESOPHAGOGASTRODUODENOSCOPY (EGD) WITH PROPOFOL N/A 06/05/2021   Procedure: ESOPHAGOGASTRODUODENOSCOPY (EGD) WITH PROPOFOL;  Surgeon:  Shellia Cleverly, DO;  Location: [facility];  Service: Gastroenterology;  Laterality: N/A;   HEMOSTASIS CLIP PLACEMENT  06/05/2021   Procedure: HEMOSTASIS CLIP PLACEMENT;  Surgeon: Shellia Cleverly, DO;  Location: [facility];  Service: Gastroenterology;;   HOT HEMOSTASIS N/A 06/05/2021   Procedure: HOT HEMOSTASIS (ARGON PLASMA COAGULATION/BICAP);  Surgeon: Shellia Cleverly, DO;  Location: [facility];  Service: Gastroenterology;  Laterality: N/A;   IR THORACENTESIS ASP PLEURAL SPACE W/IMG GUIDE  07/31/2023   LAPAROSCOPIC CHOLECYSTECTOMY     POLYPECTOMY  06/05/2021   Procedure: POLYPECTOMY;  Surgeon: Shellia Cleverly, DO;  Location: [facility];  Service: Gastroenterology;;   ROTATOR CUFF REPAIR     left    Family  History  Problem Relation Age of Onset   Stroke Mother    Prostate cancer Father    Emphysema Sister    Crohn's disease Sister    Prostate cancer Brother    Aneurysm Brother    Liver disease Brother    Hypertension Brother     Allergies  Allergen Reactions   Ciprofloxacin Diarrhea   Codeine Nausea And Vomiting   Morphine Nausea And Vomiting    Current Outpatient Medications on File Prior to Visit  Medication Sig Dispense Refill   CALCIUM PO Take 1 tablet by mouth at bedtime.     Cholecalciferol (VITAMIN D3) 125 MCG (5000 UT) TABS Take 5,000 Units by mouth at bedtime.     escitalopram (LEXAPRO) 10 MG tablet TAKE 1 TABLET (10 MG TOTAL) BY MOUTH DAILY. FOR ANXIETY. (Patient taking differently: Take 10 mg by mouth See admin instructions. Take 10 mg by mouth in the morning (for anxiety)) 90 tablet 3   ferrous sulfate 325 (65 FE) MG tablet Take 325 mg by mouth at bedtime.     hydrOXYzine (ATARAX/VISTARIL) 10 MG tablet TAKE 1 TABLET BY MOUTH EVERY DAY AS NEEDED FOR ANXIETY (Patient taking differently: Take 10 mg by mouth daily as needed for anxiety.) 90 tablet 0   levothyroxine (SYNTHROID) 75 MCG tablet TAKE 1 TABLET BY MOUTH EVERY MORNING ON AN EMPTY STOMACH WITH  WATER ONLY. NO FOOD OR OTHER MEDICATIONS FOR 30 MINUTES. (Patient taking differently: Take 75 mcg by mouth See admin instructions. Take 75 mcg (1 tablet) by mouth every morning on an empty stomach with water only.  No food or other medications for 30 minutes.) 90 tablet 0   midodrine (PROAMATINE) 5 MG tablet Take 5 mg by mouth 3 (three) times daily with meals.     Multiple Vitamins-Calcium (ONE-A-DAY WOMENS FORMULA) TABS Take 1 tablet by mouth at bedtime.     omeprazole (PRILOSEC) 40 MG capsule Take 1 capsule (40 mg total) by mouth 2 (two) times daily before a meal. Patient needs follow up appointment for future refills. Please call [phone] to schedule an appointment. (Patient taking differently: Take 40 mg by mouth in the morning and at bedtime.) 180 capsule 0   benzonatate (TESSALON) 200 MG capsule Take 1 capsule (200 mg total) by mouth 3 (three) times daily as needed for cough. (Patient not taking: Reported on 08/25/2023) 15 capsule 0   fluticasone (FLONASE) 50 MCG/ACT nasal spray Place 2 sprays into both nostrils daily. (Patient not taking: Reported on 07/22/2023) 16 g 0   No current facility-administered medications on file prior to visit.    BP 132/76   Pulse 78   Temp (!) 97.3 F (36.3 C) (Temporal)   Ht 5\' 5"  (1.651 m)   Wt 181 lb (82.1 kg)   SpO2 96%   BMI 30.12 kg/m  Objective:   Physical Exam Cardiovascular:     Rate and Rhythm: Normal rate and regular rhythm.     Comments: Moderate bilateral ankle edema Pulmonary:     Effort: Pulmonary effort is normal.     Breath sounds: Examination of the right-upper field reveals decreased breath sounds. Examination of the right-lower field reveals decreased breath sounds. Decreased breath sounds present.  Abdominal:     General: Bowel sounds are normal. There is distension.     Tenderness: There is no abdominal tenderness.  Musculoskeletal:     Cervical back: Neck supple.  Skin:    General: Skin is warm and dry.  Neurological:  Mental Status: She is alert and oriented to person, place, and time.  Psychiatric:        Mood and Affect: Mood normal.           Assessment & Plan:  Cirrhosis of liver with ascites, unspecified hepatic cirrhosis type [facility]) Assessment & Plan: Evident on exam today.  Reviewed hematology notes from September 2024 through Care Everywhere. Stat ultrasound paracentesis ordered and pending. Referral placed for home health nursing and physical therapy services.  Unfortunately, furosemide caused AKI.  We will contact hepatologist to determine a plan moving forward.  Orders: -     Ambulatory referral to Home Health -     US Paracentesis; Future  Recurrent falls Assessment & Plan: Referral placed for home health physical therapy and nursing services.  Orders: -     Ambulatory referral to Home Health  Pleural effusion associated with hepatic disorder Assessment & Plan: Questionable lung sounds on exam today. Given chronic cough, will obtain stat chest x-ray for evaluation.  Orders: -     DG Chest 2 View        Doreene Nest, NP

## 2023-08-25 NOTE — Assessment & Plan Note (Signed)
Referral placed for home health physical therapy and nursing services.

## 2023-08-25 NOTE — Assessment & Plan Note (Addendum)
Evident on exam today.  Reviewed hematology notes from September 2024 through Care Everywhere. Stat ultrasound paracentesis ordered and pending. Referral placed for home health nursing and physical therapy services.  Unfortunately, furosemide caused AKI.  We will contact hepatologist to determine a plan moving forward.

## 2023-08-25 NOTE — Assessment & Plan Note (Signed)
Questionable lung sounds on exam today. Given chronic cough, will obtain stat chest x-ray for evaluation.

## 2023-08-25 NOTE — Patient Instructions (Addendum)
You will be contacted regarding your referral to home health nursing and physical therapy.  You should receive a phone call today regarding the paracentesis.  Stop by the x-ray department today.  I will be in touch once I hear from [facility].   It was a pleasure to see you today!

## 2023-08-25 NOTE — Procedures (Signed)
PROCEDURE SUMMARY:  Successful ultrasound guided paracentesis from the right lower quadrant.  Yielded 6.3 liters of straw colored fluid.  No immediate complications.  The patient tolerated the procedure well.    EBL < 2mL  If the patient eventually requires >/=2 paracenteses in a 30 day period, screening evaluation by the [facility] Interventional Radiology Portal Hypertension Clinic will be assessed.

## 2023-08-26 ENCOUNTER — Ambulatory Visit (HOSPITAL_COMMUNITY): Payer: Medicare HMO

## 2023-09-03 ENCOUNTER — Other Ambulatory Visit: Payer: Self-pay | Admitting: Primary Care

## 2023-09-03 DIAGNOSIS — N179 Acute kidney failure, unspecified: Secondary | ICD-10-CM | POA: Diagnosis not present

## 2023-09-03 DIAGNOSIS — D649 Anemia, unspecified: Secondary | ICD-10-CM | POA: Diagnosis not present

## 2023-09-03 DIAGNOSIS — M1990 Unspecified osteoarthritis, unspecified site: Secondary | ICD-10-CM | POA: Diagnosis not present

## 2023-09-03 DIAGNOSIS — F419 Anxiety disorder, unspecified: Secondary | ICD-10-CM | POA: Diagnosis not present

## 2023-09-03 DIAGNOSIS — K7581 Nonalcoholic steatohepatitis (NASH): Secondary | ICD-10-CM | POA: Diagnosis not present

## 2023-09-03 DIAGNOSIS — E039 Hypothyroidism, unspecified: Secondary | ICD-10-CM | POA: Diagnosis not present

## 2023-09-03 DIAGNOSIS — Z87891 Personal history of nicotine dependence: Secondary | ICD-10-CM | POA: Diagnosis not present

## 2023-09-03 DIAGNOSIS — M797 Fibromyalgia: Secondary | ICD-10-CM | POA: Diagnosis not present

## 2023-09-03 DIAGNOSIS — I10 Essential (primary) hypertension: Secondary | ICD-10-CM | POA: Diagnosis not present

## 2023-09-03 DIAGNOSIS — I8500 Esophageal varices without bleeding: Secondary | ICD-10-CM | POA: Diagnosis not present

## 2023-09-03 DIAGNOSIS — Z860100 Personal history of colon polyps, unspecified: Secondary | ICD-10-CM | POA: Diagnosis not present

## 2023-09-03 DIAGNOSIS — K219 Gastro-esophageal reflux disease without esophagitis: Secondary | ICD-10-CM | POA: Diagnosis not present

## 2023-09-03 DIAGNOSIS — R188 Other ascites: Secondary | ICD-10-CM | POA: Diagnosis not present

## 2023-09-03 DIAGNOSIS — K7460 Unspecified cirrhosis of liver: Secondary | ICD-10-CM | POA: Diagnosis not present

## 2023-09-03 DIAGNOSIS — K589 Irritable bowel syndrome without diarrhea: Secondary | ICD-10-CM | POA: Diagnosis not present

## 2023-09-03 DIAGNOSIS — E785 Hyperlipidemia, unspecified: Secondary | ICD-10-CM | POA: Diagnosis not present

## 2023-09-03 DIAGNOSIS — Z87440 Personal history of urinary (tract) infections: Secondary | ICD-10-CM | POA: Diagnosis not present

## 2023-09-03 DIAGNOSIS — J90 Pleural effusion, not elsewhere classified: Secondary | ICD-10-CM | POA: Diagnosis not present

## 2023-09-03 DIAGNOSIS — Z853 Personal history of malignant neoplasm of breast: Secondary | ICD-10-CM | POA: Diagnosis not present

## 2023-09-03 DIAGNOSIS — Z9181 History of falling: Secondary | ICD-10-CM | POA: Diagnosis not present

## 2023-09-03 DIAGNOSIS — M8580 Other specified disorders of bone density and structure, unspecified site: Secondary | ICD-10-CM | POA: Diagnosis not present

## 2023-09-04 ENCOUNTER — Telehealth: Payer: Self-pay | Admitting: Primary Care

## 2023-09-04 DIAGNOSIS — J90 Pleural effusion, not elsewhere classified: Secondary | ICD-10-CM | POA: Diagnosis not present

## 2023-09-04 DIAGNOSIS — K219 Gastro-esophageal reflux disease without esophagitis: Secondary | ICD-10-CM | POA: Diagnosis not present

## 2023-09-04 DIAGNOSIS — I8500 Esophageal varices without bleeding: Secondary | ICD-10-CM | POA: Diagnosis not present

## 2023-09-04 DIAGNOSIS — F419 Anxiety disorder, unspecified: Secondary | ICD-10-CM | POA: Diagnosis not present

## 2023-09-04 DIAGNOSIS — Z860100 Personal history of colon polyps, unspecified: Secondary | ICD-10-CM | POA: Diagnosis not present

## 2023-09-04 DIAGNOSIS — K7460 Unspecified cirrhosis of liver: Secondary | ICD-10-CM | POA: Diagnosis not present

## 2023-09-04 DIAGNOSIS — N179 Acute kidney failure, unspecified: Secondary | ICD-10-CM | POA: Diagnosis not present

## 2023-09-04 DIAGNOSIS — E039 Hypothyroidism, unspecified: Secondary | ICD-10-CM | POA: Diagnosis not present

## 2023-09-04 DIAGNOSIS — M8580 Other specified disorders of bone density and structure, unspecified site: Secondary | ICD-10-CM | POA: Diagnosis not present

## 2023-09-04 DIAGNOSIS — R188 Other ascites: Secondary | ICD-10-CM | POA: Diagnosis not present

## 2023-09-04 DIAGNOSIS — K7581 Nonalcoholic steatohepatitis (NASH): Secondary | ICD-10-CM | POA: Diagnosis not present

## 2023-09-04 DIAGNOSIS — D649 Anemia, unspecified: Secondary | ICD-10-CM | POA: Diagnosis not present

## 2023-09-04 DIAGNOSIS — Z87891 Personal history of nicotine dependence: Secondary | ICD-10-CM | POA: Diagnosis not present

## 2023-09-04 DIAGNOSIS — I10 Essential (primary) hypertension: Secondary | ICD-10-CM | POA: Diagnosis not present

## 2023-09-04 DIAGNOSIS — Z9181 History of falling: Secondary | ICD-10-CM | POA: Diagnosis not present

## 2023-09-04 DIAGNOSIS — E785 Hyperlipidemia, unspecified: Secondary | ICD-10-CM | POA: Diagnosis not present

## 2023-09-04 DIAGNOSIS — Z87440 Personal history of urinary (tract) infections: Secondary | ICD-10-CM | POA: Diagnosis not present

## 2023-09-04 DIAGNOSIS — K589 Irritable bowel syndrome without diarrhea: Secondary | ICD-10-CM | POA: Diagnosis not present

## 2023-09-04 DIAGNOSIS — M1990 Unspecified osteoarthritis, unspecified site: Secondary | ICD-10-CM | POA: Diagnosis not present

## 2023-09-04 DIAGNOSIS — Z853 Personal history of malignant neoplasm of breast: Secondary | ICD-10-CM | POA: Diagnosis not present

## 2023-09-04 DIAGNOSIS — M797 Fibromyalgia: Secondary | ICD-10-CM | POA: Diagnosis not present

## 2023-09-04 NOTE — Telephone Encounter (Signed)
Have I ever seen this patient?     I am out of the office today

## 2023-09-04 NOTE — Telephone Encounter (Signed)
Home Health verbal orders Caller Name: kate white  Agency Name: [facility]  Callback number: [phone], secured   Requesting PT  Reason:  Frequency: one week one, two week two, one week one, two week five   Please forward to [facility] pool or providers CMA

## 2023-09-05 NOTE — Telephone Encounter (Signed)
Approved.  

## 2023-09-07 NOTE — Telephone Encounter (Signed)
Called and advised [name] with [facility] of the approval of the requested verbal orders for this patient. Advised to call back with any further questions.

## 2023-09-09 ENCOUNTER — Telehealth: Payer: Self-pay | Admitting: Primary Care

## 2023-09-09 NOTE — Telephone Encounter (Signed)
Home Health verbal orders Caller Name: Kreg Shropshire Agency Name: [facility]  Callback number: [phone]  Requesting Skilled nursing   Reason: disease management  Frequency: 1x a week for 4 weeks  Please forward to [facility] pool or providers CMA

## 2023-09-10 NOTE — Telephone Encounter (Signed)
Dana from [facility] called in to follow up on the verbal orders. She stated that they can't do anything without the nursing orders been approved. She can be reached at [phone]. Thank you!

## 2023-09-10 NOTE — Telephone Encounter (Signed)
Called and advised Kreg Shropshire with [facility] of the approval of the requested verbal orders for this patient.  Advised this was approved by Mort Sawyers, NP covering for Mayra Reel.  Advised to call back with any further questions.

## 2023-09-10 NOTE — Telephone Encounter (Signed)
Kreg Shropshire called checking status verbal orders requested on yesterday? Call back # [phone], secured

## 2023-09-11 DIAGNOSIS — E785 Hyperlipidemia, unspecified: Secondary | ICD-10-CM | POA: Diagnosis not present

## 2023-09-11 DIAGNOSIS — Z9181 History of falling: Secondary | ICD-10-CM | POA: Diagnosis not present

## 2023-09-11 DIAGNOSIS — I10 Essential (primary) hypertension: Secondary | ICD-10-CM | POA: Diagnosis not present

## 2023-09-11 DIAGNOSIS — Z853 Personal history of malignant neoplasm of breast: Secondary | ICD-10-CM | POA: Diagnosis not present

## 2023-09-11 DIAGNOSIS — N179 Acute kidney failure, unspecified: Secondary | ICD-10-CM | POA: Diagnosis not present

## 2023-09-11 DIAGNOSIS — J90 Pleural effusion, not elsewhere classified: Secondary | ICD-10-CM | POA: Diagnosis not present

## 2023-09-11 DIAGNOSIS — Z87440 Personal history of urinary (tract) infections: Secondary | ICD-10-CM | POA: Diagnosis not present

## 2023-09-11 DIAGNOSIS — F419 Anxiety disorder, unspecified: Secondary | ICD-10-CM | POA: Diagnosis not present

## 2023-09-11 DIAGNOSIS — K219 Gastro-esophageal reflux disease without esophagitis: Secondary | ICD-10-CM | POA: Diagnosis not present

## 2023-09-11 DIAGNOSIS — Z87891 Personal history of nicotine dependence: Secondary | ICD-10-CM | POA: Diagnosis not present

## 2023-09-11 DIAGNOSIS — Z860100 Personal history of colon polyps, unspecified: Secondary | ICD-10-CM | POA: Diagnosis not present

## 2023-09-11 DIAGNOSIS — E039 Hypothyroidism, unspecified: Secondary | ICD-10-CM | POA: Diagnosis not present

## 2023-09-11 DIAGNOSIS — K7581 Nonalcoholic steatohepatitis (NASH): Secondary | ICD-10-CM | POA: Diagnosis not present

## 2023-09-11 DIAGNOSIS — M8580 Other specified disorders of bone density and structure, unspecified site: Secondary | ICD-10-CM | POA: Diagnosis not present

## 2023-09-11 DIAGNOSIS — R188 Other ascites: Secondary | ICD-10-CM | POA: Diagnosis not present

## 2023-09-11 DIAGNOSIS — I8500 Esophageal varices without bleeding: Secondary | ICD-10-CM | POA: Diagnosis not present

## 2023-09-11 DIAGNOSIS — K7460 Unspecified cirrhosis of liver: Secondary | ICD-10-CM | POA: Diagnosis not present

## 2023-09-11 DIAGNOSIS — M797 Fibromyalgia: Secondary | ICD-10-CM | POA: Diagnosis not present

## 2023-09-11 DIAGNOSIS — D649 Anemia, unspecified: Secondary | ICD-10-CM | POA: Diagnosis not present

## 2023-09-11 DIAGNOSIS — K589 Irritable bowel syndrome without diarrhea: Secondary | ICD-10-CM | POA: Diagnosis not present

## 2023-09-11 DIAGNOSIS — M1990 Unspecified osteoarthritis, unspecified site: Secondary | ICD-10-CM | POA: Diagnosis not present

## 2023-09-14 DIAGNOSIS — D649 Anemia, unspecified: Secondary | ICD-10-CM | POA: Diagnosis not present

## 2023-09-14 DIAGNOSIS — K7460 Unspecified cirrhosis of liver: Secondary | ICD-10-CM | POA: Diagnosis not present

## 2023-09-14 DIAGNOSIS — K219 Gastro-esophageal reflux disease without esophagitis: Secondary | ICD-10-CM | POA: Diagnosis not present

## 2023-09-14 DIAGNOSIS — M8580 Other specified disorders of bone density and structure, unspecified site: Secondary | ICD-10-CM | POA: Diagnosis not present

## 2023-09-14 DIAGNOSIS — R188 Other ascites: Secondary | ICD-10-CM

## 2023-09-14 DIAGNOSIS — N179 Acute kidney failure, unspecified: Secondary | ICD-10-CM | POA: Diagnosis not present

## 2023-09-14 DIAGNOSIS — I10 Essential (primary) hypertension: Secondary | ICD-10-CM | POA: Diagnosis not present

## 2023-09-14 DIAGNOSIS — M1990 Unspecified osteoarthritis, unspecified site: Secondary | ICD-10-CM | POA: Diagnosis not present

## 2023-09-14 DIAGNOSIS — Z87891 Personal history of nicotine dependence: Secondary | ICD-10-CM | POA: Diagnosis not present

## 2023-09-14 DIAGNOSIS — K589 Irritable bowel syndrome without diarrhea: Secondary | ICD-10-CM | POA: Diagnosis not present

## 2023-09-14 DIAGNOSIS — Z9181 History of falling: Secondary | ICD-10-CM | POA: Diagnosis not present

## 2023-09-14 DIAGNOSIS — Z87440 Personal history of urinary (tract) infections: Secondary | ICD-10-CM | POA: Diagnosis not present

## 2023-09-14 DIAGNOSIS — E039 Hypothyroidism, unspecified: Secondary | ICD-10-CM | POA: Diagnosis not present

## 2023-09-14 DIAGNOSIS — Z853 Personal history of malignant neoplasm of breast: Secondary | ICD-10-CM | POA: Diagnosis not present

## 2023-09-14 DIAGNOSIS — E785 Hyperlipidemia, unspecified: Secondary | ICD-10-CM | POA: Diagnosis not present

## 2023-09-14 DIAGNOSIS — J90 Pleural effusion, not elsewhere classified: Secondary | ICD-10-CM | POA: Diagnosis not present

## 2023-09-14 DIAGNOSIS — I8500 Esophageal varices without bleeding: Secondary | ICD-10-CM | POA: Diagnosis not present

## 2023-09-14 DIAGNOSIS — K7581 Nonalcoholic steatohepatitis (NASH): Secondary | ICD-10-CM | POA: Diagnosis not present

## 2023-09-14 DIAGNOSIS — M797 Fibromyalgia: Secondary | ICD-10-CM | POA: Diagnosis not present

## 2023-09-14 DIAGNOSIS — Z860100 Personal history of colon polyps, unspecified: Secondary | ICD-10-CM | POA: Diagnosis not present

## 2023-09-14 DIAGNOSIS — F419 Anxiety disorder, unspecified: Secondary | ICD-10-CM | POA: Diagnosis not present

## 2023-09-14 NOTE — Telephone Encounter (Signed)
This will require an office visit. Since [name] is out of office

## 2023-09-15 NOTE — Telephone Encounter (Signed)
STAT team notified.

## 2023-09-15 NOTE — Telephone Encounter (Signed)
Kelli, I ordered a STAT US paracentesis for this patient. Please notify the stat teams pool.

## 2023-09-16 ENCOUNTER — Ambulatory Visit
Admission: RE | Admit: 2023-09-16 | Discharge: 2023-09-16 | Disposition: A | Discharge status: Home / Self Care | Payer: Medicare HMO | Source: Ambulatory Visit | Attending: Primary Care | Admitting: Primary Care

## 2023-09-16 ENCOUNTER — Telehealth: Payer: Self-pay | Admitting: Primary Care

## 2023-09-16 DIAGNOSIS — M8580 Other specified disorders of bone density and structure, unspecified site: Secondary | ICD-10-CM | POA: Diagnosis not present

## 2023-09-16 DIAGNOSIS — Z860100 Personal history of colon polyps, unspecified: Secondary | ICD-10-CM | POA: Diagnosis not present

## 2023-09-16 DIAGNOSIS — M1990 Unspecified osteoarthritis, unspecified site: Secondary | ICD-10-CM | POA: Diagnosis not present

## 2023-09-16 DIAGNOSIS — I10 Essential (primary) hypertension: Secondary | ICD-10-CM | POA: Diagnosis not present

## 2023-09-16 DIAGNOSIS — Z853 Personal history of malignant neoplasm of breast: Secondary | ICD-10-CM | POA: Diagnosis not present

## 2023-09-16 DIAGNOSIS — E039 Hypothyroidism, unspecified: Secondary | ICD-10-CM | POA: Diagnosis not present

## 2023-09-16 DIAGNOSIS — K7581 Nonalcoholic steatohepatitis (NASH): Secondary | ICD-10-CM | POA: Diagnosis not present

## 2023-09-16 DIAGNOSIS — D649 Anemia, unspecified: Secondary | ICD-10-CM | POA: Diagnosis not present

## 2023-09-16 DIAGNOSIS — Z9181 History of falling: Secondary | ICD-10-CM | POA: Diagnosis not present

## 2023-09-16 DIAGNOSIS — M797 Fibromyalgia: Secondary | ICD-10-CM | POA: Diagnosis not present

## 2023-09-16 DIAGNOSIS — R188 Other ascites: Secondary | ICD-10-CM | POA: Insufficient documentation

## 2023-09-16 DIAGNOSIS — K219 Gastro-esophageal reflux disease without esophagitis: Secondary | ICD-10-CM | POA: Diagnosis not present

## 2023-09-16 DIAGNOSIS — N179 Acute kidney failure, unspecified: Secondary | ICD-10-CM | POA: Diagnosis not present

## 2023-09-16 DIAGNOSIS — Z87440 Personal history of urinary (tract) infections: Secondary | ICD-10-CM | POA: Diagnosis not present

## 2023-09-16 DIAGNOSIS — I8500 Esophageal varices without bleeding: Secondary | ICD-10-CM | POA: Diagnosis not present

## 2023-09-16 DIAGNOSIS — E785 Hyperlipidemia, unspecified: Secondary | ICD-10-CM | POA: Diagnosis not present

## 2023-09-16 DIAGNOSIS — F419 Anxiety disorder, unspecified: Secondary | ICD-10-CM | POA: Diagnosis not present

## 2023-09-16 DIAGNOSIS — K7460 Unspecified cirrhosis of liver: Secondary | ICD-10-CM | POA: Diagnosis not present

## 2023-09-16 DIAGNOSIS — J90 Pleural effusion, not elsewhere classified: Secondary | ICD-10-CM | POA: Diagnosis not present

## 2023-09-16 DIAGNOSIS — Z87891 Personal history of nicotine dependence: Secondary | ICD-10-CM | POA: Diagnosis not present

## 2023-09-16 DIAGNOSIS — K589 Irritable bowel syndrome without diarrhea: Secondary | ICD-10-CM | POA: Diagnosis not present

## 2023-09-16 MED ORDER — LIDOCAINE HCL (PF) 1 % IJ SOLN
10.0000 mL | Freq: Once | INTRAMUSCULAR | Status: AC
Start: 2023-09-16 — End: 2023-09-16
  Administered 2023-09-16: 10 mL via INTRADERMAL
  Filled 2023-09-16: qty 10

## 2023-09-16 NOTE — Procedures (Signed)
PROCEDURE SUMMARY:  Successful image-guided paracentesis from the left abdomen.  Yielded 8.2 liters of clear, straw-colored peritoneal fluid.  No immediate complications.  EBL: zero Patient tolerated well.   Specimen was not sent for labs.  Please see imaging section of Epic for full dictation.  Bing Neighbors Jeananne Bedwell PA-C 09/16/2023 9:35 AM

## 2023-09-16 NOTE — Telephone Encounter (Signed)
I need to get in touch with this patient's liver specialist, Annamarie Major NP. She doesn't work within [employer], but her notes can be found in Care Everywhere. Can we call her to leave this message from me...  This patient has had to undergo two paracentesis procedures within the last 1 month, the most recent was 09/16/23 with 8.2 liters of fluid removed. What are her recommendtaions for prevention of fluid buildup or ascites? I am happy to chat with her on the phone during my lunch hour upon my return. Let me know.

## 2023-09-17 NOTE — Telephone Encounter (Signed)
Called and spoke with receptionist at [facility] office, the receptionist took down the message and routed to [facility] nurse. Advised someone will give [place] a call back in regards to this.

## 2023-09-18 DIAGNOSIS — Z860100 Personal history of colon polyps, unspecified: Secondary | ICD-10-CM | POA: Diagnosis not present

## 2023-09-18 DIAGNOSIS — Z9181 History of falling: Secondary | ICD-10-CM | POA: Diagnosis not present

## 2023-09-18 DIAGNOSIS — M797 Fibromyalgia: Secondary | ICD-10-CM | POA: Diagnosis not present

## 2023-09-18 DIAGNOSIS — F419 Anxiety disorder, unspecified: Secondary | ICD-10-CM | POA: Diagnosis not present

## 2023-09-18 DIAGNOSIS — J90 Pleural effusion, not elsewhere classified: Secondary | ICD-10-CM | POA: Diagnosis not present

## 2023-09-18 DIAGNOSIS — K219 Gastro-esophageal reflux disease without esophagitis: Secondary | ICD-10-CM | POA: Diagnosis not present

## 2023-09-18 DIAGNOSIS — E785 Hyperlipidemia, unspecified: Secondary | ICD-10-CM | POA: Diagnosis not present

## 2023-09-18 DIAGNOSIS — N179 Acute kidney failure, unspecified: Secondary | ICD-10-CM | POA: Diagnosis not present

## 2023-09-18 DIAGNOSIS — Z87440 Personal history of urinary (tract) infections: Secondary | ICD-10-CM | POA: Diagnosis not present

## 2023-09-18 DIAGNOSIS — K589 Irritable bowel syndrome without diarrhea: Secondary | ICD-10-CM | POA: Diagnosis not present

## 2023-09-18 DIAGNOSIS — I8500 Esophageal varices without bleeding: Secondary | ICD-10-CM | POA: Diagnosis not present

## 2023-09-18 DIAGNOSIS — D649 Anemia, unspecified: Secondary | ICD-10-CM | POA: Diagnosis not present

## 2023-09-18 DIAGNOSIS — Z87891 Personal history of nicotine dependence: Secondary | ICD-10-CM | POA: Diagnosis not present

## 2023-09-18 DIAGNOSIS — R188 Other ascites: Secondary | ICD-10-CM | POA: Diagnosis not present

## 2023-09-18 DIAGNOSIS — E039 Hypothyroidism, unspecified: Secondary | ICD-10-CM | POA: Diagnosis not present

## 2023-09-18 DIAGNOSIS — K7460 Unspecified cirrhosis of liver: Secondary | ICD-10-CM | POA: Diagnosis not present

## 2023-09-18 DIAGNOSIS — M1990 Unspecified osteoarthritis, unspecified site: Secondary | ICD-10-CM | POA: Diagnosis not present

## 2023-09-18 DIAGNOSIS — I10 Essential (primary) hypertension: Secondary | ICD-10-CM | POA: Diagnosis not present

## 2023-09-18 DIAGNOSIS — K7581 Nonalcoholic steatohepatitis (NASH): Secondary | ICD-10-CM | POA: Diagnosis not present

## 2023-09-18 DIAGNOSIS — Z853 Personal history of malignant neoplasm of breast: Secondary | ICD-10-CM | POA: Diagnosis not present

## 2023-09-18 DIAGNOSIS — M8580 Other specified disorders of bone density and structure, unspecified site: Secondary | ICD-10-CM | POA: Diagnosis not present

## 2023-09-21 ENCOUNTER — Other Ambulatory Visit: Payer: Self-pay | Admitting: Gastroenterology

## 2023-09-21 ENCOUNTER — Other Ambulatory Visit (HOSPITAL_COMMUNITY): Payer: Self-pay | Admitting: Nurse Practitioner

## 2023-09-21 ENCOUNTER — Other Ambulatory Visit: Payer: Self-pay | Admitting: Nurse Practitioner

## 2023-09-21 DIAGNOSIS — E039 Hypothyroidism, unspecified: Secondary | ICD-10-CM | POA: Diagnosis not present

## 2023-09-21 DIAGNOSIS — Z87440 Personal history of urinary (tract) infections: Secondary | ICD-10-CM | POA: Diagnosis not present

## 2023-09-21 DIAGNOSIS — K7581 Nonalcoholic steatohepatitis (NASH): Secondary | ICD-10-CM | POA: Diagnosis not present

## 2023-09-21 DIAGNOSIS — I10 Essential (primary) hypertension: Secondary | ICD-10-CM | POA: Diagnosis not present

## 2023-09-21 DIAGNOSIS — K589 Irritable bowel syndrome without diarrhea: Secondary | ICD-10-CM | POA: Diagnosis not present

## 2023-09-21 DIAGNOSIS — N179 Acute kidney failure, unspecified: Secondary | ICD-10-CM | POA: Diagnosis not present

## 2023-09-21 DIAGNOSIS — Z853 Personal history of malignant neoplasm of breast: Secondary | ICD-10-CM | POA: Diagnosis not present

## 2023-09-21 DIAGNOSIS — Z860100 Personal history of colon polyps, unspecified: Secondary | ICD-10-CM | POA: Diagnosis not present

## 2023-09-21 DIAGNOSIS — R188 Other ascites: Secondary | ICD-10-CM

## 2023-09-21 DIAGNOSIS — J90 Pleural effusion, not elsewhere classified: Secondary | ICD-10-CM | POA: Diagnosis not present

## 2023-09-21 DIAGNOSIS — F419 Anxiety disorder, unspecified: Secondary | ICD-10-CM | POA: Diagnosis not present

## 2023-09-21 DIAGNOSIS — M797 Fibromyalgia: Secondary | ICD-10-CM | POA: Diagnosis not present

## 2023-09-21 DIAGNOSIS — D649 Anemia, unspecified: Secondary | ICD-10-CM | POA: Diagnosis not present

## 2023-09-21 DIAGNOSIS — E785 Hyperlipidemia, unspecified: Secondary | ICD-10-CM | POA: Diagnosis not present

## 2023-09-21 DIAGNOSIS — Z87891 Personal history of nicotine dependence: Secondary | ICD-10-CM | POA: Diagnosis not present

## 2023-09-21 DIAGNOSIS — R051 Acute cough: Secondary | ICD-10-CM

## 2023-09-21 DIAGNOSIS — I8500 Esophageal varices without bleeding: Secondary | ICD-10-CM | POA: Diagnosis not present

## 2023-09-21 DIAGNOSIS — K219 Gastro-esophageal reflux disease without esophagitis: Secondary | ICD-10-CM | POA: Diagnosis not present

## 2023-09-21 DIAGNOSIS — K7460 Unspecified cirrhosis of liver: Secondary | ICD-10-CM | POA: Diagnosis not present

## 2023-09-21 DIAGNOSIS — Z9181 History of falling: Secondary | ICD-10-CM | POA: Diagnosis not present

## 2023-09-21 DIAGNOSIS — J3489 Other specified disorders of nose and nasal sinuses: Secondary | ICD-10-CM

## 2023-09-21 DIAGNOSIS — M8580 Other specified disorders of bone density and structure, unspecified site: Secondary | ICD-10-CM | POA: Diagnosis not present

## 2023-09-21 DIAGNOSIS — M1990 Unspecified osteoarthritis, unspecified site: Secondary | ICD-10-CM | POA: Diagnosis not present

## 2023-09-21 NOTE — Telephone Encounter (Signed)
Annamarie Major, NP returned call to the office, she stated that there is not much that can be done outside of paracentesis for the patients ascites. The patient has not tolerated increasing diuretics, kidney function bottoms out even with slow titrations and BP is negatively affected. Dawn says she prescribed midodrine 5 mg tablets to be taken three times daily for which the patient is not compliant. She also does not follow low sodium diet as suggested. Last time Dawn spoke with patients daughter was October of 2024 and stated they had an extensive discussion regarding the patients ascites, Dawn suggested initiating palliative care for patient, patients daughter was to consider this and reach back out if interested in referral. Alvis Lemmings has not heard from patients daughter since and she also did not reach out to make her away of the fluid building up so quickly. Dawn stated she would place a standing order to have patient get a paracentesis done every 14 days as needed. Dawn will have her staff reach out to patients daughter regarding this and information on how to schedule. Also she will have her staff try and get patients follow up moved up, is currently scheduled for March 2025. She advised Jae Dire to reach out if there is any other questions or anything she can help with.

## 2023-09-21 NOTE — Telephone Encounter (Signed)
Noted and appreciate Dawn's assistance with this patient.

## 2023-09-24 DIAGNOSIS — M8580 Other specified disorders of bone density and structure, unspecified site: Secondary | ICD-10-CM | POA: Diagnosis not present

## 2023-09-24 DIAGNOSIS — J90 Pleural effusion, not elsewhere classified: Secondary | ICD-10-CM | POA: Diagnosis not present

## 2023-09-24 DIAGNOSIS — K219 Gastro-esophageal reflux disease without esophagitis: Secondary | ICD-10-CM | POA: Diagnosis not present

## 2023-09-24 DIAGNOSIS — K7581 Nonalcoholic steatohepatitis (NASH): Secondary | ICD-10-CM | POA: Diagnosis not present

## 2023-09-24 DIAGNOSIS — E039 Hypothyroidism, unspecified: Secondary | ICD-10-CM | POA: Diagnosis not present

## 2023-09-24 DIAGNOSIS — I10 Essential (primary) hypertension: Secondary | ICD-10-CM | POA: Diagnosis not present

## 2023-09-24 DIAGNOSIS — E785 Hyperlipidemia, unspecified: Secondary | ICD-10-CM | POA: Diagnosis not present

## 2023-09-24 DIAGNOSIS — Z87440 Personal history of urinary (tract) infections: Secondary | ICD-10-CM | POA: Diagnosis not present

## 2023-09-24 DIAGNOSIS — F419 Anxiety disorder, unspecified: Secondary | ICD-10-CM | POA: Diagnosis not present

## 2023-09-24 DIAGNOSIS — Z860100 Personal history of colon polyps, unspecified: Secondary | ICD-10-CM | POA: Diagnosis not present

## 2023-09-24 DIAGNOSIS — M797 Fibromyalgia: Secondary | ICD-10-CM | POA: Diagnosis not present

## 2023-09-24 DIAGNOSIS — K7460 Unspecified cirrhosis of liver: Secondary | ICD-10-CM | POA: Diagnosis not present

## 2023-09-24 DIAGNOSIS — N179 Acute kidney failure, unspecified: Secondary | ICD-10-CM | POA: Diagnosis not present

## 2023-09-24 DIAGNOSIS — D649 Anemia, unspecified: Secondary | ICD-10-CM | POA: Diagnosis not present

## 2023-09-24 DIAGNOSIS — M1990 Unspecified osteoarthritis, unspecified site: Secondary | ICD-10-CM | POA: Diagnosis not present

## 2023-09-24 DIAGNOSIS — I8500 Esophageal varices without bleeding: Secondary | ICD-10-CM | POA: Diagnosis not present

## 2023-09-24 DIAGNOSIS — Z87891 Personal history of nicotine dependence: Secondary | ICD-10-CM | POA: Diagnosis not present

## 2023-09-24 DIAGNOSIS — Z853 Personal history of malignant neoplasm of breast: Secondary | ICD-10-CM | POA: Diagnosis not present

## 2023-09-24 DIAGNOSIS — K589 Irritable bowel syndrome without diarrhea: Secondary | ICD-10-CM | POA: Diagnosis not present

## 2023-09-24 DIAGNOSIS — Z9181 History of falling: Secondary | ICD-10-CM | POA: Diagnosis not present

## 2023-09-24 DIAGNOSIS — R188 Other ascites: Secondary | ICD-10-CM | POA: Diagnosis not present

## 2023-09-29 DIAGNOSIS — Z853 Personal history of malignant neoplasm of breast: Secondary | ICD-10-CM | POA: Diagnosis not present

## 2023-09-29 DIAGNOSIS — F419 Anxiety disorder, unspecified: Secondary | ICD-10-CM | POA: Diagnosis not present

## 2023-09-29 DIAGNOSIS — Z860100 Personal history of colon polyps, unspecified: Secondary | ICD-10-CM | POA: Diagnosis not present

## 2023-09-29 DIAGNOSIS — Z87891 Personal history of nicotine dependence: Secondary | ICD-10-CM | POA: Diagnosis not present

## 2023-09-29 DIAGNOSIS — Z9181 History of falling: Secondary | ICD-10-CM | POA: Diagnosis not present

## 2023-09-29 DIAGNOSIS — K7460 Unspecified cirrhosis of liver: Secondary | ICD-10-CM | POA: Diagnosis not present

## 2023-09-29 DIAGNOSIS — M8580 Other specified disorders of bone density and structure, unspecified site: Secondary | ICD-10-CM | POA: Diagnosis not present

## 2023-09-29 DIAGNOSIS — E785 Hyperlipidemia, unspecified: Secondary | ICD-10-CM | POA: Diagnosis not present

## 2023-09-29 DIAGNOSIS — E039 Hypothyroidism, unspecified: Secondary | ICD-10-CM | POA: Diagnosis not present

## 2023-09-29 DIAGNOSIS — Z87440 Personal history of urinary (tract) infections: Secondary | ICD-10-CM | POA: Diagnosis not present

## 2023-09-29 DIAGNOSIS — D649 Anemia, unspecified: Secondary | ICD-10-CM | POA: Diagnosis not present

## 2023-09-29 DIAGNOSIS — K219 Gastro-esophageal reflux disease without esophagitis: Secondary | ICD-10-CM | POA: Diagnosis not present

## 2023-09-29 DIAGNOSIS — I8500 Esophageal varices without bleeding: Secondary | ICD-10-CM | POA: Diagnosis not present

## 2023-09-29 DIAGNOSIS — R188 Other ascites: Secondary | ICD-10-CM | POA: Diagnosis not present

## 2023-09-29 DIAGNOSIS — K7581 Nonalcoholic steatohepatitis (NASH): Secondary | ICD-10-CM | POA: Diagnosis not present

## 2023-09-29 DIAGNOSIS — M797 Fibromyalgia: Secondary | ICD-10-CM | POA: Diagnosis not present

## 2023-09-29 DIAGNOSIS — K589 Irritable bowel syndrome without diarrhea: Secondary | ICD-10-CM | POA: Diagnosis not present

## 2023-09-29 DIAGNOSIS — J90 Pleural effusion, not elsewhere classified: Secondary | ICD-10-CM | POA: Diagnosis not present

## 2023-09-29 DIAGNOSIS — I10 Essential (primary) hypertension: Secondary | ICD-10-CM | POA: Diagnosis not present

## 2023-09-29 DIAGNOSIS — M1990 Unspecified osteoarthritis, unspecified site: Secondary | ICD-10-CM | POA: Diagnosis not present

## 2023-09-29 DIAGNOSIS — N179 Acute kidney failure, unspecified: Secondary | ICD-10-CM | POA: Diagnosis not present

## 2023-10-01 DIAGNOSIS — I8500 Esophageal varices without bleeding: Secondary | ICD-10-CM | POA: Diagnosis not present

## 2023-10-01 DIAGNOSIS — Z87891 Personal history of nicotine dependence: Secondary | ICD-10-CM | POA: Diagnosis not present

## 2023-10-01 DIAGNOSIS — M1990 Unspecified osteoarthritis, unspecified site: Secondary | ICD-10-CM | POA: Diagnosis not present

## 2023-10-01 DIAGNOSIS — F419 Anxiety disorder, unspecified: Secondary | ICD-10-CM | POA: Diagnosis not present

## 2023-10-01 DIAGNOSIS — K7460 Unspecified cirrhosis of liver: Secondary | ICD-10-CM | POA: Diagnosis not present

## 2023-10-01 DIAGNOSIS — E039 Hypothyroidism, unspecified: Secondary | ICD-10-CM | POA: Diagnosis not present

## 2023-10-01 DIAGNOSIS — J90 Pleural effusion, not elsewhere classified: Secondary | ICD-10-CM | POA: Diagnosis not present

## 2023-10-01 DIAGNOSIS — M797 Fibromyalgia: Secondary | ICD-10-CM | POA: Diagnosis not present

## 2023-10-01 DIAGNOSIS — E785 Hyperlipidemia, unspecified: Secondary | ICD-10-CM | POA: Diagnosis not present

## 2023-10-01 DIAGNOSIS — Z9181 History of falling: Secondary | ICD-10-CM | POA: Diagnosis not present

## 2023-10-01 DIAGNOSIS — Z87440 Personal history of urinary (tract) infections: Secondary | ICD-10-CM | POA: Diagnosis not present

## 2023-10-01 DIAGNOSIS — Z860100 Personal history of colon polyps, unspecified: Secondary | ICD-10-CM | POA: Diagnosis not present

## 2023-10-01 DIAGNOSIS — Z853 Personal history of malignant neoplasm of breast: Secondary | ICD-10-CM | POA: Diagnosis not present

## 2023-10-01 DIAGNOSIS — K7581 Nonalcoholic steatohepatitis (NASH): Secondary | ICD-10-CM | POA: Diagnosis not present

## 2023-10-01 DIAGNOSIS — N179 Acute kidney failure, unspecified: Secondary | ICD-10-CM | POA: Diagnosis not present

## 2023-10-01 DIAGNOSIS — I10 Essential (primary) hypertension: Secondary | ICD-10-CM | POA: Diagnosis not present

## 2023-10-01 DIAGNOSIS — K589 Irritable bowel syndrome without diarrhea: Secondary | ICD-10-CM | POA: Diagnosis not present

## 2023-10-01 DIAGNOSIS — D649 Anemia, unspecified: Secondary | ICD-10-CM | POA: Diagnosis not present

## 2023-10-01 DIAGNOSIS — K219 Gastro-esophageal reflux disease without esophagitis: Secondary | ICD-10-CM | POA: Diagnosis not present

## 2023-10-01 DIAGNOSIS — M8580 Other specified disorders of bone density and structure, unspecified site: Secondary | ICD-10-CM | POA: Diagnosis not present

## 2023-10-01 DIAGNOSIS — R188 Other ascites: Secondary | ICD-10-CM | POA: Diagnosis not present

## 2023-10-05 ENCOUNTER — Ambulatory Visit (HOSPITAL_COMMUNITY): Payer: Medicare HMO

## 2023-10-05 ENCOUNTER — Ambulatory Visit: Admission: RE | Admit: 2023-10-05 | Payer: Medicare HMO | Source: Ambulatory Visit

## 2023-10-06 ENCOUNTER — Ambulatory Visit
Admission: RE | Admit: 2023-10-06 | Discharge: 2023-10-06 | Disposition: A | Discharge status: Home / Self Care | Payer: Medicare HMO | Source: Ambulatory Visit | Attending: Nurse Practitioner | Admitting: Nurse Practitioner

## 2023-10-06 DIAGNOSIS — K7581 Nonalcoholic steatohepatitis (NASH): Secondary | ICD-10-CM | POA: Diagnosis not present

## 2023-10-06 DIAGNOSIS — R188 Other ascites: Secondary | ICD-10-CM | POA: Insufficient documentation

## 2023-10-06 MED ORDER — ALBUMIN HUMAN 25 % IV SOLN
12.5000 g | Freq: Once | INTRAVENOUS | Status: DC
  Administered 2023-10-06: 12.5 g via INTRAVENOUS

## 2023-10-06 MED ORDER — ALBUMIN HUMAN 25 % IV SOLN
25.0000 g | Freq: Once | INTRAVENOUS | Status: AC
  Administered 2023-10-06: 25 g via INTRAVENOUS

## 2023-10-06 MED ORDER — ALBUMIN HUMAN 25 % IV SOLN: 25.0000 g | Freq: Once | INTRAVENOUS | Status: DC

## 2023-10-06 MED ORDER — ALBUMIN HUMAN 25 % IV SOLN
INTRAVENOUS | Status: AC
  Filled 2023-10-06: qty 100

## 2023-10-06 MED ORDER — ALBUMIN HUMAN 25 % IV SOLN: 12.5000 g | Freq: Once | INTRAVENOUS | Status: DC

## 2023-10-06 MED ORDER — LIDOCAINE HCL (PF) 1 % IJ SOLN
10.0000 mL | Freq: Once | INTRAMUSCULAR | Status: AC
Start: 2023-10-06 — End: 2023-10-06
  Administered 2023-10-06: 10 mL via INTRADERMAL
  Filled 2023-10-06: qty 10

## 2023-10-06 NOTE — Procedures (Signed)
 PROCEDURE SUMMARY:  Successful US  guided paracentesis from the right lower abdomen.  Yielded 6.8L of clear, yellow fluid.  No immediate complications.  Pt tolerated well.   No labs requested.  Albumin  ordered.  EBL < 5mL  Kennan Detter N Arriyah Madej PA-C 10/06/2023 10:24 AM

## 2023-10-12 ENCOUNTER — Telehealth: Payer: Self-pay | Admitting: *Deleted

## 2023-10-12 DIAGNOSIS — K7581 Nonalcoholic steatohepatitis (NASH): Secondary | ICD-10-CM | POA: Diagnosis not present

## 2023-10-12 DIAGNOSIS — R188 Other ascites: Secondary | ICD-10-CM | POA: Diagnosis not present

## 2023-10-12 DIAGNOSIS — F419 Anxiety disorder, unspecified: Secondary | ICD-10-CM | POA: Diagnosis not present

## 2023-10-12 DIAGNOSIS — I8500 Esophageal varices without bleeding: Secondary | ICD-10-CM | POA: Diagnosis not present

## 2023-10-12 DIAGNOSIS — K589 Irritable bowel syndrome without diarrhea: Secondary | ICD-10-CM | POA: Diagnosis not present

## 2023-10-12 DIAGNOSIS — Z87891 Personal history of nicotine dependence: Secondary | ICD-10-CM | POA: Diagnosis not present

## 2023-10-12 DIAGNOSIS — Z9181 History of falling: Secondary | ICD-10-CM | POA: Diagnosis not present

## 2023-10-12 DIAGNOSIS — Z853 Personal history of malignant neoplasm of breast: Secondary | ICD-10-CM | POA: Diagnosis not present

## 2023-10-12 DIAGNOSIS — J90 Pleural effusion, not elsewhere classified: Secondary | ICD-10-CM | POA: Diagnosis not present

## 2023-10-12 DIAGNOSIS — K219 Gastro-esophageal reflux disease without esophagitis: Secondary | ICD-10-CM | POA: Diagnosis not present

## 2023-10-12 DIAGNOSIS — Z860100 Personal history of colon polyps, unspecified: Secondary | ICD-10-CM | POA: Diagnosis not present

## 2023-10-12 DIAGNOSIS — I10 Essential (primary) hypertension: Secondary | ICD-10-CM | POA: Diagnosis not present

## 2023-10-12 DIAGNOSIS — D649 Anemia, unspecified: Secondary | ICD-10-CM | POA: Diagnosis not present

## 2023-10-12 DIAGNOSIS — E039 Hypothyroidism, unspecified: Secondary | ICD-10-CM | POA: Diagnosis not present

## 2023-10-12 DIAGNOSIS — M8580 Other specified disorders of bone density and structure, unspecified site: Secondary | ICD-10-CM | POA: Diagnosis not present

## 2023-10-12 DIAGNOSIS — E785 Hyperlipidemia, unspecified: Secondary | ICD-10-CM | POA: Diagnosis not present

## 2023-10-12 DIAGNOSIS — N179 Acute kidney failure, unspecified: Secondary | ICD-10-CM | POA: Diagnosis not present

## 2023-10-12 DIAGNOSIS — Z87440 Personal history of urinary (tract) infections: Secondary | ICD-10-CM | POA: Diagnosis not present

## 2023-10-12 DIAGNOSIS — M797 Fibromyalgia: Secondary | ICD-10-CM | POA: Diagnosis not present

## 2023-10-12 DIAGNOSIS — K7460 Unspecified cirrhosis of liver: Secondary | ICD-10-CM | POA: Diagnosis not present

## 2023-10-12 DIAGNOSIS — M1990 Unspecified osteoarthritis, unspecified site: Secondary | ICD-10-CM | POA: Diagnosis not present

## 2023-10-12 NOTE — Telephone Encounter (Signed)
 Copied from CRM [id number]. Topic: General - Other >> Oct 12, 2023  4:22 PM Burnard DEL wrote: Reason for CRM: PT assistant Isaiah with [facility]  called in to report that patient had a fall on Friday getting out of bed.She did not hit her head,she's doing fine. Contact #[phone]

## 2023-10-12 NOTE — Telephone Encounter (Signed)
 Noted.

## 2023-10-15 DIAGNOSIS — M8580 Other specified disorders of bone density and structure, unspecified site: Secondary | ICD-10-CM | POA: Diagnosis not present

## 2023-10-15 DIAGNOSIS — N179 Acute kidney failure, unspecified: Secondary | ICD-10-CM | POA: Diagnosis not present

## 2023-10-15 DIAGNOSIS — E785 Hyperlipidemia, unspecified: Secondary | ICD-10-CM | POA: Diagnosis not present

## 2023-10-15 DIAGNOSIS — K219 Gastro-esophageal reflux disease without esophagitis: Secondary | ICD-10-CM | POA: Diagnosis not present

## 2023-10-15 DIAGNOSIS — Z87891 Personal history of nicotine dependence: Secondary | ICD-10-CM | POA: Diagnosis not present

## 2023-10-15 DIAGNOSIS — D649 Anemia, unspecified: Secondary | ICD-10-CM | POA: Diagnosis not present

## 2023-10-15 DIAGNOSIS — E039 Hypothyroidism, unspecified: Secondary | ICD-10-CM | POA: Diagnosis not present

## 2023-10-15 DIAGNOSIS — J90 Pleural effusion, not elsewhere classified: Secondary | ICD-10-CM | POA: Diagnosis not present

## 2023-10-15 DIAGNOSIS — F419 Anxiety disorder, unspecified: Secondary | ICD-10-CM | POA: Diagnosis not present

## 2023-10-15 DIAGNOSIS — K7460 Unspecified cirrhosis of liver: Secondary | ICD-10-CM | POA: Diagnosis not present

## 2023-10-15 DIAGNOSIS — K7581 Nonalcoholic steatohepatitis (NASH): Secondary | ICD-10-CM | POA: Diagnosis not present

## 2023-10-15 DIAGNOSIS — I8500 Esophageal varices without bleeding: Secondary | ICD-10-CM | POA: Diagnosis not present

## 2023-10-15 DIAGNOSIS — Z860100 Personal history of colon polyps, unspecified: Secondary | ICD-10-CM | POA: Diagnosis not present

## 2023-10-15 DIAGNOSIS — Z853 Personal history of malignant neoplasm of breast: Secondary | ICD-10-CM | POA: Diagnosis not present

## 2023-10-15 DIAGNOSIS — K589 Irritable bowel syndrome without diarrhea: Secondary | ICD-10-CM | POA: Diagnosis not present

## 2023-10-15 DIAGNOSIS — M797 Fibromyalgia: Secondary | ICD-10-CM | POA: Diagnosis not present

## 2023-10-15 DIAGNOSIS — I10 Essential (primary) hypertension: Secondary | ICD-10-CM | POA: Diagnosis not present

## 2023-10-15 DIAGNOSIS — M1990 Unspecified osteoarthritis, unspecified site: Secondary | ICD-10-CM | POA: Diagnosis not present

## 2023-10-15 DIAGNOSIS — R188 Other ascites: Secondary | ICD-10-CM | POA: Diagnosis not present

## 2023-10-15 DIAGNOSIS — Z9181 History of falling: Secondary | ICD-10-CM | POA: Diagnosis not present

## 2023-10-15 DIAGNOSIS — Z87440 Personal history of urinary (tract) infections: Secondary | ICD-10-CM | POA: Diagnosis not present

## 2023-10-20 ENCOUNTER — Other Ambulatory Visit: Payer: Self-pay | Admitting: Gastroenterology

## 2023-10-20 ENCOUNTER — Telehealth: Payer: Self-pay

## 2023-10-20 DIAGNOSIS — R188 Other ascites: Secondary | ICD-10-CM

## 2023-10-20 DIAGNOSIS — K219 Gastro-esophageal reflux disease without esophagitis: Secondary | ICD-10-CM

## 2023-10-20 MED ORDER — OMEPRAZOLE 40 MG PO CPDR: 40.0000 mg | DELAYED_RELEASE_CAPSULE | Freq: Two times a day (BID) | ORAL | 0 refills | Status: DC

## 2023-10-20 NOTE — Telephone Encounter (Signed)
Received refill request for patients omeprazole. Phone call to patients daughter Alvis Lemmings to schedule a follow up for refills.  Per Dawn, "mom doesn't get out much anymore and we will contact Jae Dire, NP at [facility] for refills."  Dawn thanked me for the call and understood that we will not send in refills for omeprazole.

## 2023-10-20 NOTE — Telephone Encounter (Signed)
Received refill request for patients omeprazole. Phone call to patients daughter [name] to schedule a follow up for refills.  Per Dawn, "mom doesn't get out much anymore and we will contact Jae Dire, NP at [facility] for refills."  Dawn thanked me for the call and understood that we will not send in refills for omeprazole.

## 2023-10-21 DIAGNOSIS — E039 Hypothyroidism, unspecified: Secondary | ICD-10-CM | POA: Diagnosis not present

## 2023-10-21 DIAGNOSIS — K7460 Unspecified cirrhosis of liver: Secondary | ICD-10-CM | POA: Diagnosis not present

## 2023-10-21 DIAGNOSIS — Z87440 Personal history of urinary (tract) infections: Secondary | ICD-10-CM | POA: Diagnosis not present

## 2023-10-21 DIAGNOSIS — R188 Other ascites: Secondary | ICD-10-CM | POA: Diagnosis not present

## 2023-10-21 DIAGNOSIS — I10 Essential (primary) hypertension: Secondary | ICD-10-CM | POA: Diagnosis not present

## 2023-10-21 DIAGNOSIS — Z853 Personal history of malignant neoplasm of breast: Secondary | ICD-10-CM | POA: Diagnosis not present

## 2023-10-21 DIAGNOSIS — K589 Irritable bowel syndrome without diarrhea: Secondary | ICD-10-CM | POA: Diagnosis not present

## 2023-10-21 DIAGNOSIS — F419 Anxiety disorder, unspecified: Secondary | ICD-10-CM | POA: Diagnosis not present

## 2023-10-21 DIAGNOSIS — K219 Gastro-esophageal reflux disease without esophagitis: Secondary | ICD-10-CM | POA: Diagnosis not present

## 2023-10-21 DIAGNOSIS — M8580 Other specified disorders of bone density and structure, unspecified site: Secondary | ICD-10-CM | POA: Diagnosis not present

## 2023-10-21 DIAGNOSIS — N179 Acute kidney failure, unspecified: Secondary | ICD-10-CM | POA: Diagnosis not present

## 2023-10-21 DIAGNOSIS — M797 Fibromyalgia: Secondary | ICD-10-CM | POA: Diagnosis not present

## 2023-10-21 DIAGNOSIS — Z87891 Personal history of nicotine dependence: Secondary | ICD-10-CM | POA: Diagnosis not present

## 2023-10-21 DIAGNOSIS — M1990 Unspecified osteoarthritis, unspecified site: Secondary | ICD-10-CM | POA: Diagnosis not present

## 2023-10-21 DIAGNOSIS — Z860100 Personal history of colon polyps, unspecified: Secondary | ICD-10-CM | POA: Diagnosis not present

## 2023-10-21 DIAGNOSIS — K7581 Nonalcoholic steatohepatitis (NASH): Secondary | ICD-10-CM | POA: Diagnosis not present

## 2023-10-21 DIAGNOSIS — Z9181 History of falling: Secondary | ICD-10-CM | POA: Diagnosis not present

## 2023-10-21 DIAGNOSIS — J90 Pleural effusion, not elsewhere classified: Secondary | ICD-10-CM | POA: Diagnosis not present

## 2023-10-21 DIAGNOSIS — D649 Anemia, unspecified: Secondary | ICD-10-CM | POA: Diagnosis not present

## 2023-10-21 DIAGNOSIS — I8500 Esophageal varices without bleeding: Secondary | ICD-10-CM | POA: Diagnosis not present

## 2023-10-21 DIAGNOSIS — E785 Hyperlipidemia, unspecified: Secondary | ICD-10-CM | POA: Diagnosis not present

## 2023-10-23 DIAGNOSIS — K589 Irritable bowel syndrome without diarrhea: Secondary | ICD-10-CM | POA: Diagnosis not present

## 2023-10-23 DIAGNOSIS — Z853 Personal history of malignant neoplasm of breast: Secondary | ICD-10-CM | POA: Diagnosis not present

## 2023-10-23 DIAGNOSIS — D649 Anemia, unspecified: Secondary | ICD-10-CM | POA: Diagnosis not present

## 2023-10-23 DIAGNOSIS — F419 Anxiety disorder, unspecified: Secondary | ICD-10-CM | POA: Diagnosis not present

## 2023-10-23 DIAGNOSIS — Z87891 Personal history of nicotine dependence: Secondary | ICD-10-CM | POA: Diagnosis not present

## 2023-10-23 DIAGNOSIS — I8500 Esophageal varices without bleeding: Secondary | ICD-10-CM | POA: Diagnosis not present

## 2023-10-23 DIAGNOSIS — K7460 Unspecified cirrhosis of liver: Secondary | ICD-10-CM | POA: Diagnosis not present

## 2023-10-23 DIAGNOSIS — Z860100 Personal history of colon polyps, unspecified: Secondary | ICD-10-CM | POA: Diagnosis not present

## 2023-10-23 DIAGNOSIS — Z87440 Personal history of urinary (tract) infections: Secondary | ICD-10-CM | POA: Diagnosis not present

## 2023-10-23 DIAGNOSIS — E039 Hypothyroidism, unspecified: Secondary | ICD-10-CM | POA: Diagnosis not present

## 2023-10-23 DIAGNOSIS — Z9181 History of falling: Secondary | ICD-10-CM | POA: Diagnosis not present

## 2023-10-23 DIAGNOSIS — N179 Acute kidney failure, unspecified: Secondary | ICD-10-CM | POA: Diagnosis not present

## 2023-10-23 DIAGNOSIS — I10 Essential (primary) hypertension: Secondary | ICD-10-CM | POA: Diagnosis not present

## 2023-10-23 DIAGNOSIS — J90 Pleural effusion, not elsewhere classified: Secondary | ICD-10-CM | POA: Diagnosis not present

## 2023-10-23 DIAGNOSIS — K219 Gastro-esophageal reflux disease without esophagitis: Secondary | ICD-10-CM | POA: Diagnosis not present

## 2023-10-23 DIAGNOSIS — M1990 Unspecified osteoarthritis, unspecified site: Secondary | ICD-10-CM | POA: Diagnosis not present

## 2023-10-23 DIAGNOSIS — R188 Other ascites: Secondary | ICD-10-CM | POA: Diagnosis not present

## 2023-10-23 DIAGNOSIS — M797 Fibromyalgia: Secondary | ICD-10-CM | POA: Diagnosis not present

## 2023-10-23 DIAGNOSIS — K7581 Nonalcoholic steatohepatitis (NASH): Secondary | ICD-10-CM | POA: Diagnosis not present

## 2023-10-23 DIAGNOSIS — E785 Hyperlipidemia, unspecified: Secondary | ICD-10-CM | POA: Diagnosis not present

## 2023-10-23 DIAGNOSIS — M8580 Other specified disorders of bone density and structure, unspecified site: Secondary | ICD-10-CM | POA: Diagnosis not present

## 2023-10-26 DIAGNOSIS — Z87891 Personal history of nicotine dependence: Secondary | ICD-10-CM | POA: Diagnosis not present

## 2023-10-26 DIAGNOSIS — M8580 Other specified disorders of bone density and structure, unspecified site: Secondary | ICD-10-CM | POA: Diagnosis not present

## 2023-10-26 DIAGNOSIS — F419 Anxiety disorder, unspecified: Secondary | ICD-10-CM | POA: Diagnosis not present

## 2023-10-26 DIAGNOSIS — K219 Gastro-esophageal reflux disease without esophagitis: Secondary | ICD-10-CM | POA: Diagnosis not present

## 2023-10-26 DIAGNOSIS — I10 Essential (primary) hypertension: Secondary | ICD-10-CM | POA: Diagnosis not present

## 2023-10-26 DIAGNOSIS — Z9181 History of falling: Secondary | ICD-10-CM | POA: Diagnosis not present

## 2023-10-26 DIAGNOSIS — Z860100 Personal history of colon polyps, unspecified: Secondary | ICD-10-CM | POA: Diagnosis not present

## 2023-10-26 DIAGNOSIS — I8500 Esophageal varices without bleeding: Secondary | ICD-10-CM | POA: Diagnosis not present

## 2023-10-26 DIAGNOSIS — K7460 Unspecified cirrhosis of liver: Secondary | ICD-10-CM | POA: Diagnosis not present

## 2023-10-26 DIAGNOSIS — J90 Pleural effusion, not elsewhere classified: Secondary | ICD-10-CM | POA: Diagnosis not present

## 2023-10-26 DIAGNOSIS — Z853 Personal history of malignant neoplasm of breast: Secondary | ICD-10-CM | POA: Diagnosis not present

## 2023-10-26 DIAGNOSIS — K7581 Nonalcoholic steatohepatitis (NASH): Secondary | ICD-10-CM | POA: Diagnosis not present

## 2023-10-26 DIAGNOSIS — N179 Acute kidney failure, unspecified: Secondary | ICD-10-CM | POA: Diagnosis not present

## 2023-10-26 DIAGNOSIS — M1990 Unspecified osteoarthritis, unspecified site: Secondary | ICD-10-CM | POA: Diagnosis not present

## 2023-10-26 DIAGNOSIS — M797 Fibromyalgia: Secondary | ICD-10-CM | POA: Diagnosis not present

## 2023-10-26 DIAGNOSIS — E785 Hyperlipidemia, unspecified: Secondary | ICD-10-CM | POA: Diagnosis not present

## 2023-10-26 DIAGNOSIS — D649 Anemia, unspecified: Secondary | ICD-10-CM | POA: Diagnosis not present

## 2023-10-26 DIAGNOSIS — R188 Other ascites: Secondary | ICD-10-CM | POA: Diagnosis not present

## 2023-10-26 DIAGNOSIS — Z87440 Personal history of urinary (tract) infections: Secondary | ICD-10-CM | POA: Diagnosis not present

## 2023-10-26 DIAGNOSIS — K589 Irritable bowel syndrome without diarrhea: Secondary | ICD-10-CM | POA: Diagnosis not present

## 2023-10-26 DIAGNOSIS — E039 Hypothyroidism, unspecified: Secondary | ICD-10-CM | POA: Diagnosis not present

## 2023-10-27 ENCOUNTER — Ambulatory Visit
Admission: RE | Admit: 2023-10-27 | Discharge: 2023-10-27 | Disposition: A | Discharge status: Home / Self Care | Payer: Medicare HMO | Source: Ambulatory Visit | Attending: Nurse Practitioner | Admitting: Nurse Practitioner

## 2023-10-27 DIAGNOSIS — R188 Other ascites: Secondary | ICD-10-CM | POA: Diagnosis not present

## 2023-10-27 DIAGNOSIS — K766 Portal hypertension: Secondary | ICD-10-CM | POA: Diagnosis not present

## 2023-10-27 DIAGNOSIS — K7460 Unspecified cirrhosis of liver: Secondary | ICD-10-CM | POA: Diagnosis not present

## 2023-10-27 DIAGNOSIS — K7581 Nonalcoholic steatohepatitis (NASH): Secondary | ICD-10-CM | POA: Diagnosis not present

## 2023-10-27 MED ORDER — LIDOCAINE HCL (PF) 1 % IJ SOLN
10.0000 mL | Freq: Once | INTRAMUSCULAR | Status: AC
  Administered 2023-10-27: 10 mL via INTRADERMAL
  Filled 2023-10-27: qty 10

## 2023-10-27 MED ORDER — ALBUMIN HUMAN 25 % IV SOLN: 37.5000 g | Freq: Once | INTRAVENOUS | Status: AC

## 2023-10-27 MED ORDER — ALBUMIN HUMAN 25 % IV SOLN
INTRAVENOUS | Status: AC
  Filled 2023-10-27: qty 100

## 2023-10-27 MED ORDER — ALBUMIN HUMAN 25 % IV SOLN
INTRAVENOUS | Status: AC
  Administered 2023-10-27: 37.5 g via INTRAVENOUS
  Filled 2023-10-27: qty 100

## 2023-10-27 NOTE — Procedures (Signed)
PROCEDURE SUMMARY:  Successful ultrasound guided paracentesis from the right lower quadrant.  Yielded  of 6.4 Liters fluid.  No immediate complications.  The patient tolerated the procedure well.    EBL < 2mL  The patient has required >/=2 paracenteses in a 30 day period and a screening evaluation by the [facility] Interventional Radiology Portal Hypertension Clinic has been arranged.

## 2023-10-28 DIAGNOSIS — K7460 Unspecified cirrhosis of liver: Secondary | ICD-10-CM | POA: Diagnosis not present

## 2023-10-28 DIAGNOSIS — K7581 Nonalcoholic steatohepatitis (NASH): Secondary | ICD-10-CM | POA: Diagnosis not present

## 2023-10-28 DIAGNOSIS — R188 Other ascites: Secondary | ICD-10-CM | POA: Diagnosis not present

## 2023-10-30 DIAGNOSIS — N179 Acute kidney failure, unspecified: Secondary | ICD-10-CM | POA: Diagnosis not present

## 2023-10-30 DIAGNOSIS — M8580 Other specified disorders of bone density and structure, unspecified site: Secondary | ICD-10-CM | POA: Diagnosis not present

## 2023-10-30 DIAGNOSIS — Z860100 Personal history of colon polyps, unspecified: Secondary | ICD-10-CM | POA: Diagnosis not present

## 2023-10-30 DIAGNOSIS — K589 Irritable bowel syndrome without diarrhea: Secondary | ICD-10-CM | POA: Diagnosis not present

## 2023-10-30 DIAGNOSIS — R188 Other ascites: Secondary | ICD-10-CM | POA: Diagnosis not present

## 2023-10-30 DIAGNOSIS — Z87440 Personal history of urinary (tract) infections: Secondary | ICD-10-CM | POA: Diagnosis not present

## 2023-10-30 DIAGNOSIS — Z9181 History of falling: Secondary | ICD-10-CM | POA: Diagnosis not present

## 2023-10-30 DIAGNOSIS — K7581 Nonalcoholic steatohepatitis (NASH): Secondary | ICD-10-CM | POA: Diagnosis not present

## 2023-10-30 DIAGNOSIS — D649 Anemia, unspecified: Secondary | ICD-10-CM | POA: Diagnosis not present

## 2023-10-30 DIAGNOSIS — Z87891 Personal history of nicotine dependence: Secondary | ICD-10-CM | POA: Diagnosis not present

## 2023-10-30 DIAGNOSIS — Z853 Personal history of malignant neoplasm of breast: Secondary | ICD-10-CM | POA: Diagnosis not present

## 2023-10-30 DIAGNOSIS — I10 Essential (primary) hypertension: Secondary | ICD-10-CM | POA: Diagnosis not present

## 2023-10-30 DIAGNOSIS — J90 Pleural effusion, not elsewhere classified: Secondary | ICD-10-CM | POA: Diagnosis not present

## 2023-10-30 DIAGNOSIS — M1990 Unspecified osteoarthritis, unspecified site: Secondary | ICD-10-CM | POA: Diagnosis not present

## 2023-10-30 DIAGNOSIS — E039 Hypothyroidism, unspecified: Secondary | ICD-10-CM | POA: Diagnosis not present

## 2023-10-30 DIAGNOSIS — K7460 Unspecified cirrhosis of liver: Secondary | ICD-10-CM | POA: Diagnosis not present

## 2023-10-30 DIAGNOSIS — E785 Hyperlipidemia, unspecified: Secondary | ICD-10-CM | POA: Diagnosis not present

## 2023-10-30 DIAGNOSIS — I8500 Esophageal varices without bleeding: Secondary | ICD-10-CM | POA: Diagnosis not present

## 2023-10-30 DIAGNOSIS — F419 Anxiety disorder, unspecified: Secondary | ICD-10-CM | POA: Diagnosis not present

## 2023-10-30 DIAGNOSIS — K219 Gastro-esophageal reflux disease without esophagitis: Secondary | ICD-10-CM | POA: Diagnosis not present

## 2023-10-30 DIAGNOSIS — M797 Fibromyalgia: Secondary | ICD-10-CM | POA: Diagnosis not present

## 2023-11-02 ENCOUNTER — Telehealth: Payer: Self-pay

## 2023-11-02 NOTE — Telephone Encounter (Signed)
Copied from CRM [id number]. Topic: Clinical - Home Health Verbal Orders >> Nov 02, 2023 10:58 AM Myrtice Lauth wrote: Caller/Agency: Jae Dire with [facility] home [facility]  Callback Number: [phone] Service Requested: Physical Therapy, home health  Frequency: 1 week 9  Any new concerns about the patient? No

## 2023-11-02 NOTE — Telephone Encounter (Signed)
 Approved.

## 2023-11-03 NOTE — Telephone Encounter (Signed)
Called and advised Anda Kraft with [facility] of the approval of the requested verbal orders for this patient. Advised to call back with any further questions.

## 2023-11-06 DIAGNOSIS — J90 Pleural effusion, not elsewhere classified: Secondary | ICD-10-CM | POA: Diagnosis not present

## 2023-11-06 DIAGNOSIS — M1990 Unspecified osteoarthritis, unspecified site: Secondary | ICD-10-CM | POA: Diagnosis not present

## 2023-11-06 DIAGNOSIS — E039 Hypothyroidism, unspecified: Secondary | ICD-10-CM | POA: Diagnosis not present

## 2023-11-06 DIAGNOSIS — D649 Anemia, unspecified: Secondary | ICD-10-CM | POA: Diagnosis not present

## 2023-11-06 DIAGNOSIS — K589 Irritable bowel syndrome without diarrhea: Secondary | ICD-10-CM | POA: Diagnosis not present

## 2023-11-06 DIAGNOSIS — E785 Hyperlipidemia, unspecified: Secondary | ICD-10-CM | POA: Diagnosis not present

## 2023-11-06 DIAGNOSIS — K219 Gastro-esophageal reflux disease without esophagitis: Secondary | ICD-10-CM | POA: Diagnosis not present

## 2023-11-06 DIAGNOSIS — F419 Anxiety disorder, unspecified: Secondary | ICD-10-CM | POA: Diagnosis not present

## 2023-11-06 DIAGNOSIS — I10 Essential (primary) hypertension: Secondary | ICD-10-CM | POA: Diagnosis not present

## 2023-11-06 DIAGNOSIS — Z853 Personal history of malignant neoplasm of breast: Secondary | ICD-10-CM | POA: Diagnosis not present

## 2023-11-06 DIAGNOSIS — Z87440 Personal history of urinary (tract) infections: Secondary | ICD-10-CM | POA: Diagnosis not present

## 2023-11-06 DIAGNOSIS — I8500 Esophageal varices without bleeding: Secondary | ICD-10-CM | POA: Diagnosis not present

## 2023-11-06 DIAGNOSIS — Z860100 Personal history of colon polyps, unspecified: Secondary | ICD-10-CM | POA: Diagnosis not present

## 2023-11-06 DIAGNOSIS — K7460 Unspecified cirrhosis of liver: Secondary | ICD-10-CM | POA: Diagnosis not present

## 2023-11-06 DIAGNOSIS — Z9181 History of falling: Secondary | ICD-10-CM | POA: Diagnosis not present

## 2023-11-06 DIAGNOSIS — Z87891 Personal history of nicotine dependence: Secondary | ICD-10-CM | POA: Diagnosis not present

## 2023-11-06 DIAGNOSIS — N179 Acute kidney failure, unspecified: Secondary | ICD-10-CM | POA: Diagnosis not present

## 2023-11-06 DIAGNOSIS — M8580 Other specified disorders of bone density and structure, unspecified site: Secondary | ICD-10-CM | POA: Diagnosis not present

## 2023-11-06 DIAGNOSIS — M797 Fibromyalgia: Secondary | ICD-10-CM | POA: Diagnosis not present

## 2023-11-06 DIAGNOSIS — R188 Other ascites: Secondary | ICD-10-CM | POA: Diagnosis not present

## 2023-11-06 DIAGNOSIS — K7581 Nonalcoholic steatohepatitis (NASH): Secondary | ICD-10-CM | POA: Diagnosis not present

## 2023-11-11 DIAGNOSIS — E785 Hyperlipidemia, unspecified: Secondary | ICD-10-CM | POA: Diagnosis not present

## 2023-11-11 DIAGNOSIS — D649 Anemia, unspecified: Secondary | ICD-10-CM | POA: Diagnosis not present

## 2023-11-11 DIAGNOSIS — N179 Acute kidney failure, unspecified: Secondary | ICD-10-CM | POA: Diagnosis not present

## 2023-11-11 DIAGNOSIS — M797 Fibromyalgia: Secondary | ICD-10-CM | POA: Diagnosis not present

## 2023-11-11 DIAGNOSIS — M1990 Unspecified osteoarthritis, unspecified site: Secondary | ICD-10-CM | POA: Diagnosis not present

## 2023-11-11 DIAGNOSIS — Z87891 Personal history of nicotine dependence: Secondary | ICD-10-CM | POA: Diagnosis not present

## 2023-11-11 DIAGNOSIS — Z87440 Personal history of urinary (tract) infections: Secondary | ICD-10-CM | POA: Diagnosis not present

## 2023-11-11 DIAGNOSIS — K7581 Nonalcoholic steatohepatitis (NASH): Secondary | ICD-10-CM | POA: Diagnosis not present

## 2023-11-11 DIAGNOSIS — I10 Essential (primary) hypertension: Secondary | ICD-10-CM | POA: Diagnosis not present

## 2023-11-11 DIAGNOSIS — M8580 Other specified disorders of bone density and structure, unspecified site: Secondary | ICD-10-CM | POA: Diagnosis not present

## 2023-11-11 DIAGNOSIS — K7460 Unspecified cirrhosis of liver: Secondary | ICD-10-CM | POA: Diagnosis not present

## 2023-11-11 DIAGNOSIS — F419 Anxiety disorder, unspecified: Secondary | ICD-10-CM | POA: Diagnosis not present

## 2023-11-11 DIAGNOSIS — K219 Gastro-esophageal reflux disease without esophagitis: Secondary | ICD-10-CM | POA: Diagnosis not present

## 2023-11-11 DIAGNOSIS — E039 Hypothyroidism, unspecified: Secondary | ICD-10-CM | POA: Diagnosis not present

## 2023-11-11 DIAGNOSIS — Z860100 Personal history of colon polyps, unspecified: Secondary | ICD-10-CM | POA: Diagnosis not present

## 2023-11-11 DIAGNOSIS — Z9181 History of falling: Secondary | ICD-10-CM | POA: Diagnosis not present

## 2023-11-11 DIAGNOSIS — I8500 Esophageal varices without bleeding: Secondary | ICD-10-CM | POA: Diagnosis not present

## 2023-11-11 DIAGNOSIS — K589 Irritable bowel syndrome without diarrhea: Secondary | ICD-10-CM | POA: Diagnosis not present

## 2023-11-11 DIAGNOSIS — J90 Pleural effusion, not elsewhere classified: Secondary | ICD-10-CM | POA: Diagnosis not present

## 2023-11-11 DIAGNOSIS — Z853 Personal history of malignant neoplasm of breast: Secondary | ICD-10-CM | POA: Diagnosis not present

## 2023-11-11 DIAGNOSIS — R188 Other ascites: Secondary | ICD-10-CM | POA: Diagnosis not present

## 2023-11-16 ENCOUNTER — Ambulatory Visit
Admission: RE | Admit: 2023-11-16 | Discharge: 2023-11-16 | Disposition: A | Discharge status: Home / Self Care | Payer: Medicare HMO | Source: Ambulatory Visit | Attending: Nurse Practitioner

## 2023-11-16 DIAGNOSIS — R188 Other ascites: Secondary | ICD-10-CM | POA: Diagnosis not present

## 2023-11-16 DIAGNOSIS — K7460 Unspecified cirrhosis of liver: Secondary | ICD-10-CM | POA: Insufficient documentation

## 2023-11-16 MED ORDER — ALBUMIN HUMAN 25 % IV SOLN
INTRAVENOUS | Status: AC
  Filled 2023-11-16: qty 100

## 2023-11-16 MED ORDER — LIDOCAINE HCL (PF) 1 % IJ SOLN
10.0000 mL | Freq: Once | INTRAMUSCULAR | Status: AC
  Administered 2023-11-16: 10 mL via INTRADERMAL
  Filled 2023-11-16: qty 10

## 2023-11-16 MED ORDER — ALBUMIN HUMAN 25 % IV SOLN: 37.5000 g | Freq: Once | INTRAVENOUS | Status: AC

## 2023-11-16 MED ORDER — ALBUMIN HUMAN 25 % IV SOLN
INTRAVENOUS | Status: AC
  Administered 2023-11-16: 37.5 g via INTRAVENOUS
  Filled 2023-11-16: qty 100

## 2023-11-16 NOTE — Procedures (Signed)
PROCEDURE SUMMARY:  Successful image-guided paracentesis from the right abdomen.  Yielded 6.3 liters of clear, straw-colored peritoneal fluid.  No immediate complications.  EBL: zero Patient tolerated well.    Please see imaging section of Epic for full dictation.  Bing Neighbors Elaura Calix PA-C 11/16/2023 5:02 PM

## 2023-11-18 DIAGNOSIS — K7460 Unspecified cirrhosis of liver: Secondary | ICD-10-CM | POA: Diagnosis not present

## 2023-11-18 DIAGNOSIS — I10 Essential (primary) hypertension: Secondary | ICD-10-CM | POA: Diagnosis not present

## 2023-11-18 DIAGNOSIS — M8580 Other specified disorders of bone density and structure, unspecified site: Secondary | ICD-10-CM | POA: Diagnosis not present

## 2023-11-18 DIAGNOSIS — M797 Fibromyalgia: Secondary | ICD-10-CM | POA: Diagnosis not present

## 2023-11-18 DIAGNOSIS — M1990 Unspecified osteoarthritis, unspecified site: Secondary | ICD-10-CM | POA: Diagnosis not present

## 2023-11-18 DIAGNOSIS — K7581 Nonalcoholic steatohepatitis (NASH): Secondary | ICD-10-CM | POA: Diagnosis not present

## 2023-11-18 DIAGNOSIS — Z9181 History of falling: Secondary | ICD-10-CM | POA: Diagnosis not present

## 2023-11-18 DIAGNOSIS — E785 Hyperlipidemia, unspecified: Secondary | ICD-10-CM | POA: Diagnosis not present

## 2023-11-18 DIAGNOSIS — K589 Irritable bowel syndrome without diarrhea: Secondary | ICD-10-CM | POA: Diagnosis not present

## 2023-11-18 DIAGNOSIS — D649 Anemia, unspecified: Secondary | ICD-10-CM | POA: Diagnosis not present

## 2023-11-18 DIAGNOSIS — F419 Anxiety disorder, unspecified: Secondary | ICD-10-CM | POA: Diagnosis not present

## 2023-11-18 DIAGNOSIS — J90 Pleural effusion, not elsewhere classified: Secondary | ICD-10-CM | POA: Diagnosis not present

## 2023-11-18 DIAGNOSIS — Z87891 Personal history of nicotine dependence: Secondary | ICD-10-CM | POA: Diagnosis not present

## 2023-11-18 DIAGNOSIS — R188 Other ascites: Secondary | ICD-10-CM | POA: Diagnosis not present

## 2023-11-18 DIAGNOSIS — Z853 Personal history of malignant neoplasm of breast: Secondary | ICD-10-CM | POA: Diagnosis not present

## 2023-11-18 DIAGNOSIS — I8500 Esophageal varices without bleeding: Secondary | ICD-10-CM | POA: Diagnosis not present

## 2023-11-18 DIAGNOSIS — E039 Hypothyroidism, unspecified: Secondary | ICD-10-CM | POA: Diagnosis not present

## 2023-11-18 DIAGNOSIS — N179 Acute kidney failure, unspecified: Secondary | ICD-10-CM | POA: Diagnosis not present

## 2023-11-18 DIAGNOSIS — Z87440 Personal history of urinary (tract) infections: Secondary | ICD-10-CM | POA: Diagnosis not present

## 2023-11-18 DIAGNOSIS — Z860100 Personal history of colon polyps, unspecified: Secondary | ICD-10-CM | POA: Diagnosis not present

## 2023-11-18 DIAGNOSIS — K219 Gastro-esophageal reflux disease without esophagitis: Secondary | ICD-10-CM | POA: Diagnosis not present

## 2023-11-23 NOTE — Progress Notes (Signed)
   Portal Hypertension Clinic Screening Evaluation   Indication for evaluation: [name] is a 83 y.o. female undergoing preliminary evaluation in the [facility] Interventional Radiology Portal Hypertension Clinic due to recurrent ascites.  Referring Physician/Established Gastroenterologist:  Annamarie Major, NP  Etiology of cirrhosis: MASH Initially diagnosed: 2008 # of paracentesis in last month: 2 # of paracentesis in last 2 months: 3 History of hepatic hydrothorax:  no History of hepatic encephalopathy: no  Prior evaluation for liver transplant: yes History of hepatocellular carcinoma: no  Prior esophagogastroduodenoscopy/intervention: 06/05/21 (Dr. Barron Alvine - Grade I esophageal varices, portal hypertensive gastropathy, antral polyp, duodenal angioectasia) Current esophageal varices: yes Current gastric varices: no History of hematemesis: no  Current diuretic regimen: furosemide 20 mg every day, spironolactone 50 mg QD Current pharmacologic encephalopathy prophylaxis/treatment: none  History of renal dysfunction: Stage 3b CKD History of hemodialysis: no  History of cardiac dysfunction: no  Other pertinent past medical history: left breast cancer, anxiety, fibromyalgia, GERD, hemorrhoids, hyperlipidemia, hypertension, hypothyroidism, osteopenia   Imaging: Prior cross sectional imaging of portal system: CTA abdomen (07/08/22)  Cirrhosis, patent portal system, esophageal varices, ascites, no ectopic varices or native portosystemic shunting  Echocardiogram: None   Labs: 07/22/23 Creatinine: 1.0 Total Bilirubin: 1.2  INR: 1.5 Sodium: 135 Albumin: 2.0  Child-Pugh = 9 points, class B MELD = 12 (estimated 3 month mortality after TIPS of 6.0%) [place] Index of Post-TIPS Survival (FIPS) = 0.47 (overall survival after TIPS estimated at 1 month 92.9%, 3 months 77.7%, and 6 months 68.3%)    Assessment: [name] is a 83 y.o. female with history of  decompensated MASH cirrhosis (Child Pugh B9, MELD 12) with recurrent ascites and limited diuretic dosing due to renal function.  After preliminary evaluation, this patient would be a possible TIPS candidate for reduction of ascites production.    Recommendation: -Formal consult for TIPS could be considered.  The patient's Hepatologist, Dr. Elsie Stain, will be contacted for further discussion. -If referred, would recommend obtaining echocardiogram and updated cross sectional imaging (CTA abdomen/pelvis BRTO protocol) -would consider cessation of nonselective beta blocker in the presence of ascites    Electronically Signed: Bennie Dallas, MD 11/23/2023, 10:04 AM

## 2023-11-27 DIAGNOSIS — M797 Fibromyalgia: Secondary | ICD-10-CM | POA: Diagnosis not present

## 2023-11-27 DIAGNOSIS — K7581 Nonalcoholic steatohepatitis (NASH): Secondary | ICD-10-CM | POA: Diagnosis not present

## 2023-11-27 DIAGNOSIS — I10 Essential (primary) hypertension: Secondary | ICD-10-CM | POA: Diagnosis not present

## 2023-11-27 DIAGNOSIS — Z87440 Personal history of urinary (tract) infections: Secondary | ICD-10-CM | POA: Diagnosis not present

## 2023-11-27 DIAGNOSIS — N179 Acute kidney failure, unspecified: Secondary | ICD-10-CM | POA: Diagnosis not present

## 2023-11-27 DIAGNOSIS — Z87891 Personal history of nicotine dependence: Secondary | ICD-10-CM | POA: Diagnosis not present

## 2023-11-27 DIAGNOSIS — I8500 Esophageal varices without bleeding: Secondary | ICD-10-CM | POA: Diagnosis not present

## 2023-11-27 DIAGNOSIS — F419 Anxiety disorder, unspecified: Secondary | ICD-10-CM | POA: Diagnosis not present

## 2023-11-27 DIAGNOSIS — M1990 Unspecified osteoarthritis, unspecified site: Secondary | ICD-10-CM | POA: Diagnosis not present

## 2023-11-27 DIAGNOSIS — K219 Gastro-esophageal reflux disease without esophagitis: Secondary | ICD-10-CM | POA: Diagnosis not present

## 2023-11-27 DIAGNOSIS — R188 Other ascites: Secondary | ICD-10-CM | POA: Diagnosis not present

## 2023-11-27 DIAGNOSIS — J90 Pleural effusion, not elsewhere classified: Secondary | ICD-10-CM | POA: Diagnosis not present

## 2023-11-27 DIAGNOSIS — E039 Hypothyroidism, unspecified: Secondary | ICD-10-CM | POA: Diagnosis not present

## 2023-11-27 DIAGNOSIS — Z853 Personal history of malignant neoplasm of breast: Secondary | ICD-10-CM | POA: Diagnosis not present

## 2023-11-27 DIAGNOSIS — M8580 Other specified disorders of bone density and structure, unspecified site: Secondary | ICD-10-CM | POA: Diagnosis not present

## 2023-11-27 DIAGNOSIS — K589 Irritable bowel syndrome without diarrhea: Secondary | ICD-10-CM | POA: Diagnosis not present

## 2023-11-27 DIAGNOSIS — Z9181 History of falling: Secondary | ICD-10-CM | POA: Diagnosis not present

## 2023-11-27 DIAGNOSIS — Z860100 Personal history of colon polyps, unspecified: Secondary | ICD-10-CM | POA: Diagnosis not present

## 2023-11-27 DIAGNOSIS — D649 Anemia, unspecified: Secondary | ICD-10-CM | POA: Diagnosis not present

## 2023-11-27 DIAGNOSIS — E785 Hyperlipidemia, unspecified: Secondary | ICD-10-CM | POA: Diagnosis not present

## 2023-11-27 DIAGNOSIS — K7460 Unspecified cirrhosis of liver: Secondary | ICD-10-CM | POA: Diagnosis not present

## 2023-12-01 DIAGNOSIS — K7460 Unspecified cirrhosis of liver: Secondary | ICD-10-CM | POA: Diagnosis not present

## 2023-12-01 DIAGNOSIS — K219 Gastro-esophageal reflux disease without esophagitis: Secondary | ICD-10-CM | POA: Diagnosis not present

## 2023-12-01 DIAGNOSIS — F419 Anxiety disorder, unspecified: Secondary | ICD-10-CM | POA: Diagnosis not present

## 2023-12-01 DIAGNOSIS — J90 Pleural effusion, not elsewhere classified: Secondary | ICD-10-CM | POA: Diagnosis not present

## 2023-12-01 DIAGNOSIS — D649 Anemia, unspecified: Secondary | ICD-10-CM | POA: Diagnosis not present

## 2023-12-01 DIAGNOSIS — Z87891 Personal history of nicotine dependence: Secondary | ICD-10-CM | POA: Diagnosis not present

## 2023-12-01 DIAGNOSIS — K7581 Nonalcoholic steatohepatitis (NASH): Secondary | ICD-10-CM | POA: Diagnosis not present

## 2023-12-01 DIAGNOSIS — Z87440 Personal history of urinary (tract) infections: Secondary | ICD-10-CM | POA: Diagnosis not present

## 2023-12-01 DIAGNOSIS — I10 Essential (primary) hypertension: Secondary | ICD-10-CM | POA: Diagnosis not present

## 2023-12-01 DIAGNOSIS — K589 Irritable bowel syndrome without diarrhea: Secondary | ICD-10-CM | POA: Diagnosis not present

## 2023-12-01 DIAGNOSIS — R188 Other ascites: Secondary | ICD-10-CM | POA: Diagnosis not present

## 2023-12-01 DIAGNOSIS — N179 Acute kidney failure, unspecified: Secondary | ICD-10-CM | POA: Diagnosis not present

## 2023-12-01 DIAGNOSIS — Z860100 Personal history of colon polyps, unspecified: Secondary | ICD-10-CM | POA: Diagnosis not present

## 2023-12-01 DIAGNOSIS — M1990 Unspecified osteoarthritis, unspecified site: Secondary | ICD-10-CM | POA: Diagnosis not present

## 2023-12-01 DIAGNOSIS — E039 Hypothyroidism, unspecified: Secondary | ICD-10-CM | POA: Diagnosis not present

## 2023-12-01 DIAGNOSIS — Z9181 History of falling: Secondary | ICD-10-CM | POA: Diagnosis not present

## 2023-12-01 DIAGNOSIS — M797 Fibromyalgia: Secondary | ICD-10-CM | POA: Diagnosis not present

## 2023-12-01 DIAGNOSIS — M8580 Other specified disorders of bone density and structure, unspecified site: Secondary | ICD-10-CM | POA: Diagnosis not present

## 2023-12-01 DIAGNOSIS — Z853 Personal history of malignant neoplasm of breast: Secondary | ICD-10-CM | POA: Diagnosis not present

## 2023-12-01 DIAGNOSIS — I8500 Esophageal varices without bleeding: Secondary | ICD-10-CM | POA: Diagnosis not present

## 2023-12-01 DIAGNOSIS — E785 Hyperlipidemia, unspecified: Secondary | ICD-10-CM | POA: Diagnosis not present

## 2023-12-07 ENCOUNTER — Ambulatory Visit
Admission: RE | Admit: 2023-12-07 | Discharge: 2023-12-07 | Disposition: A | Discharge status: Home / Self Care | Source: Ambulatory Visit | Attending: Nurse Practitioner | Admitting: Nurse Practitioner

## 2023-12-07 DIAGNOSIS — K7581 Nonalcoholic steatohepatitis (NASH): Secondary | ICD-10-CM | POA: Diagnosis not present

## 2023-12-07 DIAGNOSIS — R188 Other ascites: Secondary | ICD-10-CM | POA: Diagnosis not present

## 2023-12-07 MED ORDER — ALBUMIN HUMAN 25 % IV SOLN
25.0000 g | Freq: Once | INTRAVENOUS | Status: AC
  Administered 2023-12-07: 25 g via INTRAVENOUS

## 2023-12-07 MED ORDER — ALBUMIN HUMAN 25 % IV SOLN
INTRAVENOUS | Status: AC
Start: 2023-12-07 — End: ?
  Filled 2023-12-07: qty 100

## 2023-12-07 MED ORDER — ALBUMIN HUMAN 25 % IV SOLN
INTRAVENOUS | Status: AC
  Filled 2023-12-07: qty 100

## 2023-12-07 MED ORDER — LIDOCAINE HCL (PF) 1 % IJ SOLN
10.0000 mL | Freq: Once | INTRAMUSCULAR | Status: AC
  Administered 2023-12-07: 10 mL via INTRADERMAL
  Filled 2023-12-07: qty 10

## 2023-12-07 NOTE — Procedures (Signed)
 PROCEDURE SUMMARY:  Successful US guided paracentesis from right lateral abdomen.  Yielded 8 liters of straw-yellow fluid.  No immediate complications.  Patient tolerated well.  EBL = trace  Loman Brooklyn PA-C 12/07/2023 10:53 AM

## 2023-12-09 ENCOUNTER — Ambulatory Visit
Admission: RE | Admit: 2023-12-09 | Discharge: 2023-12-09 | Disposition: A | Discharge status: Home / Self Care | Payer: Medicare HMO | Source: Ambulatory Visit | Attending: Primary Care

## 2023-12-09 DIAGNOSIS — Z1231 Encounter for screening mammogram for malignant neoplasm of breast: Secondary | ICD-10-CM

## 2023-12-10 DIAGNOSIS — M797 Fibromyalgia: Secondary | ICD-10-CM | POA: Diagnosis not present

## 2023-12-10 DIAGNOSIS — Z9181 History of falling: Secondary | ICD-10-CM | POA: Diagnosis not present

## 2023-12-10 DIAGNOSIS — N179 Acute kidney failure, unspecified: Secondary | ICD-10-CM | POA: Diagnosis not present

## 2023-12-10 DIAGNOSIS — E785 Hyperlipidemia, unspecified: Secondary | ICD-10-CM | POA: Diagnosis not present

## 2023-12-10 DIAGNOSIS — R188 Other ascites: Secondary | ICD-10-CM | POA: Diagnosis not present

## 2023-12-10 DIAGNOSIS — I10 Essential (primary) hypertension: Secondary | ICD-10-CM | POA: Diagnosis not present

## 2023-12-10 DIAGNOSIS — F419 Anxiety disorder, unspecified: Secondary | ICD-10-CM | POA: Diagnosis not present

## 2023-12-10 DIAGNOSIS — I8500 Esophageal varices without bleeding: Secondary | ICD-10-CM | POA: Diagnosis not present

## 2023-12-10 DIAGNOSIS — D649 Anemia, unspecified: Secondary | ICD-10-CM | POA: Diagnosis not present

## 2023-12-10 DIAGNOSIS — M8580 Other specified disorders of bone density and structure, unspecified site: Secondary | ICD-10-CM | POA: Diagnosis not present

## 2023-12-10 DIAGNOSIS — E039 Hypothyroidism, unspecified: Secondary | ICD-10-CM | POA: Diagnosis not present

## 2023-12-10 DIAGNOSIS — K7460 Unspecified cirrhosis of liver: Secondary | ICD-10-CM | POA: Diagnosis not present

## 2023-12-10 DIAGNOSIS — M1990 Unspecified osteoarthritis, unspecified site: Secondary | ICD-10-CM | POA: Diagnosis not present

## 2023-12-10 DIAGNOSIS — Z87891 Personal history of nicotine dependence: Secondary | ICD-10-CM | POA: Diagnosis not present

## 2023-12-10 DIAGNOSIS — K589 Irritable bowel syndrome without diarrhea: Secondary | ICD-10-CM | POA: Diagnosis not present

## 2023-12-10 DIAGNOSIS — Z87440 Personal history of urinary (tract) infections: Secondary | ICD-10-CM | POA: Diagnosis not present

## 2023-12-10 DIAGNOSIS — J90 Pleural effusion, not elsewhere classified: Secondary | ICD-10-CM | POA: Diagnosis not present

## 2023-12-10 DIAGNOSIS — Z853 Personal history of malignant neoplasm of breast: Secondary | ICD-10-CM | POA: Diagnosis not present

## 2023-12-10 DIAGNOSIS — K7581 Nonalcoholic steatohepatitis (NASH): Secondary | ICD-10-CM | POA: Diagnosis not present

## 2023-12-10 DIAGNOSIS — Z860100 Personal history of colon polyps, unspecified: Secondary | ICD-10-CM | POA: Diagnosis not present

## 2023-12-10 DIAGNOSIS — K219 Gastro-esophageal reflux disease without esophagitis: Secondary | ICD-10-CM | POA: Diagnosis not present

## 2023-12-14 ENCOUNTER — Other Ambulatory Visit: Payer: Self-pay | Admitting: Primary Care

## 2023-12-14 DIAGNOSIS — R928 Other abnormal and inconclusive findings on diagnostic imaging of breast: Secondary | ICD-10-CM

## 2023-12-21 ENCOUNTER — Other Ambulatory Visit (HOSPITAL_COMMUNITY): Payer: Self-pay | Admitting: Nurse Practitioner

## 2023-12-21 ENCOUNTER — Other Ambulatory Visit: Payer: Self-pay | Admitting: Nurse Practitioner

## 2023-12-21 DIAGNOSIS — K7682 Hepatic encephalopathy: Secondary | ICD-10-CM | POA: Diagnosis not present

## 2023-12-21 DIAGNOSIS — I8510 Secondary esophageal varices without bleeding: Secondary | ICD-10-CM

## 2023-12-21 DIAGNOSIS — Z713 Dietary counseling and surveillance: Secondary | ICD-10-CM | POA: Diagnosis not present

## 2023-12-21 DIAGNOSIS — R188 Other ascites: Secondary | ICD-10-CM

## 2023-12-21 DIAGNOSIS — K7581 Nonalcoholic steatohepatitis (NASH): Secondary | ICD-10-CM | POA: Diagnosis not present

## 2023-12-21 DIAGNOSIS — D376 Neoplasm of uncertain behavior of liver, gallbladder and bile ducts: Secondary | ICD-10-CM | POA: Diagnosis not present

## 2023-12-21 DIAGNOSIS — K7460 Unspecified cirrhosis of liver: Secondary | ICD-10-CM

## 2023-12-21 DIAGNOSIS — I9589 Other hypotension: Secondary | ICD-10-CM | POA: Diagnosis not present

## 2023-12-22 DIAGNOSIS — K7581 Nonalcoholic steatohepatitis (NASH): Secondary | ICD-10-CM

## 2023-12-22 DIAGNOSIS — K7460 Unspecified cirrhosis of liver: Secondary | ICD-10-CM

## 2023-12-23 ENCOUNTER — Telehealth: Payer: Self-pay

## 2023-12-23 DIAGNOSIS — Z853 Personal history of malignant neoplasm of breast: Secondary | ICD-10-CM | POA: Diagnosis not present

## 2023-12-23 DIAGNOSIS — F419 Anxiety disorder, unspecified: Secondary | ICD-10-CM | POA: Diagnosis not present

## 2023-12-23 DIAGNOSIS — K7460 Unspecified cirrhosis of liver: Secondary | ICD-10-CM | POA: Diagnosis not present

## 2023-12-23 DIAGNOSIS — E039 Hypothyroidism, unspecified: Secondary | ICD-10-CM | POA: Diagnosis not present

## 2023-12-23 DIAGNOSIS — N179 Acute kidney failure, unspecified: Secondary | ICD-10-CM | POA: Diagnosis not present

## 2023-12-23 DIAGNOSIS — I10 Essential (primary) hypertension: Secondary | ICD-10-CM | POA: Diagnosis not present

## 2023-12-23 DIAGNOSIS — K219 Gastro-esophageal reflux disease without esophagitis: Secondary | ICD-10-CM | POA: Diagnosis not present

## 2023-12-23 DIAGNOSIS — I8500 Esophageal varices without bleeding: Secondary | ICD-10-CM | POA: Diagnosis not present

## 2023-12-23 DIAGNOSIS — K589 Irritable bowel syndrome without diarrhea: Secondary | ICD-10-CM | POA: Diagnosis not present

## 2023-12-23 DIAGNOSIS — M8580 Other specified disorders of bone density and structure, unspecified site: Secondary | ICD-10-CM | POA: Diagnosis not present

## 2023-12-23 DIAGNOSIS — Z860100 Personal history of colon polyps, unspecified: Secondary | ICD-10-CM | POA: Diagnosis not present

## 2023-12-23 DIAGNOSIS — J90 Pleural effusion, not elsewhere classified: Secondary | ICD-10-CM | POA: Diagnosis not present

## 2023-12-23 DIAGNOSIS — D649 Anemia, unspecified: Secondary | ICD-10-CM | POA: Diagnosis not present

## 2023-12-23 DIAGNOSIS — R188 Other ascites: Secondary | ICD-10-CM | POA: Diagnosis not present

## 2023-12-23 DIAGNOSIS — Z87891 Personal history of nicotine dependence: Secondary | ICD-10-CM | POA: Diagnosis not present

## 2023-12-23 DIAGNOSIS — Z87440 Personal history of urinary (tract) infections: Secondary | ICD-10-CM | POA: Diagnosis not present

## 2023-12-23 DIAGNOSIS — M1990 Unspecified osteoarthritis, unspecified site: Secondary | ICD-10-CM | POA: Diagnosis not present

## 2023-12-23 DIAGNOSIS — M797 Fibromyalgia: Secondary | ICD-10-CM | POA: Diagnosis not present

## 2023-12-23 DIAGNOSIS — Z9181 History of falling: Secondary | ICD-10-CM | POA: Diagnosis not present

## 2023-12-23 DIAGNOSIS — K7581 Nonalcoholic steatohepatitis (NASH): Secondary | ICD-10-CM | POA: Diagnosis not present

## 2023-12-23 DIAGNOSIS — E785 Hyperlipidemia, unspecified: Secondary | ICD-10-CM | POA: Diagnosis not present

## 2023-12-23 NOTE — Telephone Encounter (Signed)
 Copied from CRM [id number]. Topic: General - Other >> Dec 22, 2023  4:26 PM Florestine Avers wrote: Reason for CRM: Lisette Grinder called to state that they did receieve the Pallative of care forms and they will follow up with the patient.

## 2023-12-23 NOTE — Telephone Encounter (Signed)
 Noted.

## 2023-12-24 ENCOUNTER — Ambulatory Visit

## 2023-12-24 ENCOUNTER — Ambulatory Visit
Admission: RE | Admit: 2023-12-24 | Discharge: 2023-12-24 | Disposition: A | Discharge status: Home / Self Care | Source: Ambulatory Visit | Attending: Primary Care

## 2023-12-24 DIAGNOSIS — R928 Other abnormal and inconclusive findings on diagnostic imaging of breast: Secondary | ICD-10-CM | POA: Diagnosis not present

## 2023-12-25 ENCOUNTER — Ambulatory Visit
Admission: RE | Admit: 2023-12-25 | Discharge: 2023-12-25 | Disposition: A | Discharge status: Home / Self Care | Source: Ambulatory Visit | Attending: Nurse Practitioner | Admitting: Nurse Practitioner

## 2023-12-25 DIAGNOSIS — K7460 Unspecified cirrhosis of liver: Secondary | ICD-10-CM | POA: Diagnosis not present

## 2023-12-25 DIAGNOSIS — K7581 Nonalcoholic steatohepatitis (NASH): Secondary | ICD-10-CM | POA: Diagnosis not present

## 2023-12-25 DIAGNOSIS — R188 Other ascites: Secondary | ICD-10-CM | POA: Insufficient documentation

## 2023-12-25 DIAGNOSIS — D376 Neoplasm of uncertain behavior of liver, gallbladder and bile ducts: Secondary | ICD-10-CM | POA: Diagnosis not present

## 2023-12-25 DIAGNOSIS — I8510 Secondary esophageal varices without bleeding: Secondary | ICD-10-CM | POA: Diagnosis not present

## 2023-12-25 DIAGNOSIS — I9589 Other hypotension: Secondary | ICD-10-CM | POA: Diagnosis not present

## 2023-12-25 DIAGNOSIS — K7682 Hepatic encephalopathy: Secondary | ICD-10-CM | POA: Diagnosis not present

## 2023-12-25 MED ORDER — ALBUMIN HUMAN 25 % IV SOLN: 25.0000 g | Freq: Once | INTRAVENOUS | Status: AC

## 2023-12-25 MED ORDER — ALBUMIN HUMAN 25 % IV SOLN
25.0000 g | Freq: Once | INTRAVENOUS | Status: AC
  Administered 2023-12-25: 25 g via INTRAVENOUS

## 2023-12-25 MED ORDER — ALBUMIN HUMAN 25 % IV SOLN
INTRAVENOUS | Status: AC
  Filled 2023-12-25: qty 100

## 2023-12-25 MED ORDER — LIDOCAINE HCL (PF) 1 % IJ SOLN
10.0000 mL | Freq: Once | INTRAMUSCULAR | Status: AC
Start: 2023-12-25 — End: 2023-12-25
  Administered 2023-12-25: 10 mL via SUBCUTANEOUS
  Filled 2023-12-25: qty 10

## 2023-12-25 MED ORDER — ALBUMIN HUMAN 25 % IV SOLN
INTRAVENOUS | Status: AC
Start: 2023-12-25 — End: 2023-12-25
  Administered 2023-12-25: 25 g via INTRAVENOUS
  Filled 2023-12-25: qty 100

## 2023-12-25 NOTE — Procedures (Signed)
 PROCEDURE SUMMARY:  Successful image-guided therapeutic paracentesis from the right abdomen.  Yielded 8 liters of yellow fluid.  No immediate complications.  EBL: zero Patient tolerated well.   Per Dr. Jerrye Noble evaluation on 11/16/23 formal consult for TIPS could be considered. The patient's Hepatologist, Dr. Elsie Stain, was contacted for further discussion.   Please see imaging section of Epic for full dictation.  Vandana Haman NP 12/25/2023 11:44 AM

## 2023-12-28 DIAGNOSIS — F419 Anxiety disorder, unspecified: Secondary | ICD-10-CM | POA: Diagnosis not present

## 2023-12-28 DIAGNOSIS — M1990 Unspecified osteoarthritis, unspecified site: Secondary | ICD-10-CM | POA: Diagnosis not present

## 2023-12-28 DIAGNOSIS — E039 Hypothyroidism, unspecified: Secondary | ICD-10-CM | POA: Diagnosis not present

## 2023-12-28 DIAGNOSIS — M8580 Other specified disorders of bone density and structure, unspecified site: Secondary | ICD-10-CM | POA: Diagnosis not present

## 2023-12-28 DIAGNOSIS — Z853 Personal history of malignant neoplasm of breast: Secondary | ICD-10-CM | POA: Diagnosis not present

## 2023-12-28 DIAGNOSIS — Z87891 Personal history of nicotine dependence: Secondary | ICD-10-CM | POA: Diagnosis not present

## 2023-12-28 DIAGNOSIS — N179 Acute kidney failure, unspecified: Secondary | ICD-10-CM | POA: Diagnosis not present

## 2023-12-28 DIAGNOSIS — K219 Gastro-esophageal reflux disease without esophagitis: Secondary | ICD-10-CM | POA: Diagnosis not present

## 2023-12-28 DIAGNOSIS — K7460 Unspecified cirrhosis of liver: Secondary | ICD-10-CM | POA: Diagnosis not present

## 2023-12-28 DIAGNOSIS — Z87440 Personal history of urinary (tract) infections: Secondary | ICD-10-CM | POA: Diagnosis not present

## 2023-12-28 DIAGNOSIS — E785 Hyperlipidemia, unspecified: Secondary | ICD-10-CM | POA: Diagnosis not present

## 2023-12-28 DIAGNOSIS — K7581 Nonalcoholic steatohepatitis (NASH): Secondary | ICD-10-CM | POA: Diagnosis not present

## 2023-12-28 DIAGNOSIS — Z9181 History of falling: Secondary | ICD-10-CM | POA: Diagnosis not present

## 2023-12-28 DIAGNOSIS — Z860100 Personal history of colon polyps, unspecified: Secondary | ICD-10-CM | POA: Diagnosis not present

## 2023-12-28 DIAGNOSIS — K589 Irritable bowel syndrome without diarrhea: Secondary | ICD-10-CM | POA: Diagnosis not present

## 2023-12-28 DIAGNOSIS — D649 Anemia, unspecified: Secondary | ICD-10-CM | POA: Diagnosis not present

## 2023-12-28 DIAGNOSIS — I10 Essential (primary) hypertension: Secondary | ICD-10-CM | POA: Diagnosis not present

## 2023-12-28 DIAGNOSIS — I8500 Esophageal varices without bleeding: Secondary | ICD-10-CM | POA: Diagnosis not present

## 2023-12-28 DIAGNOSIS — M797 Fibromyalgia: Secondary | ICD-10-CM | POA: Diagnosis not present

## 2023-12-28 DIAGNOSIS — J90 Pleural effusion, not elsewhere classified: Secondary | ICD-10-CM | POA: Diagnosis not present

## 2023-12-28 DIAGNOSIS — R188 Other ascites: Secondary | ICD-10-CM | POA: Diagnosis not present

## 2024-01-04 ENCOUNTER — Ambulatory Visit
Admission: RE | Admit: 2024-01-04 | Discharge: 2024-01-04 | Disposition: A | Discharge status: Home / Self Care | Source: Ambulatory Visit | Attending: Nurse Practitioner

## 2024-01-04 DIAGNOSIS — K7460 Unspecified cirrhosis of liver: Secondary | ICD-10-CM

## 2024-01-04 DIAGNOSIS — K7682 Hepatic encephalopathy: Secondary | ICD-10-CM

## 2024-01-04 DIAGNOSIS — I8510 Secondary esophageal varices without bleeding: Secondary | ICD-10-CM

## 2024-01-04 DIAGNOSIS — N189 Chronic kidney disease, unspecified: Secondary | ICD-10-CM | POA: Diagnosis not present

## 2024-01-04 DIAGNOSIS — R188 Other ascites: Secondary | ICD-10-CM | POA: Diagnosis not present

## 2024-01-07 ENCOUNTER — Ambulatory Visit
Admission: RE | Admit: 2024-01-07 | Discharge: 2024-01-07 | Disposition: A | Discharge status: Home / Self Care | Source: Ambulatory Visit | Attending: Nurse Practitioner | Admitting: Nurse Practitioner

## 2024-01-07 ENCOUNTER — Telehealth: Payer: Self-pay | Admitting: Primary Care

## 2024-01-07 ENCOUNTER — Other Ambulatory Visit (HOSPITAL_COMMUNITY): Payer: Self-pay | Admitting: Nurse Practitioner

## 2024-01-07 DIAGNOSIS — R188 Other ascites: Secondary | ICD-10-CM | POA: Diagnosis not present

## 2024-01-07 DIAGNOSIS — K7460 Unspecified cirrhosis of liver: Secondary | ICD-10-CM | POA: Diagnosis not present

## 2024-01-07 DIAGNOSIS — J918 Pleural effusion in other conditions classified elsewhere: Secondary | ICD-10-CM

## 2024-01-07 DIAGNOSIS — K7581 Nonalcoholic steatohepatitis (NASH): Secondary | ICD-10-CM | POA: Diagnosis not present

## 2024-01-07 DIAGNOSIS — R296 Repeated falls: Secondary | ICD-10-CM

## 2024-01-07 MED ORDER — LIDOCAINE HCL (PF) 1 % IJ SOLN
10.0000 mL | Freq: Once | INTRAMUSCULAR | Status: AC
  Administered 2024-01-07: 10 mL via INTRADERMAL
  Filled 2024-01-07: qty 10

## 2024-01-07 MED ORDER — ALBUMIN HUMAN 25 % IV SOLN
INTRAVENOUS | Status: AC
  Filled 2024-01-07: qty 100

## 2024-01-07 MED ORDER — ALBUMIN HUMAN 25 % IV SOLN
25.0000 g | Freq: Once | INTRAVENOUS | Status: AC
  Administered 2024-01-07: 25 g via INTRAVENOUS

## 2024-01-07 MED ORDER — ALBUMIN HUMAN 25 % IV SOLN
INTRAVENOUS | Status: AC
Start: 2024-01-07 — End: ?
  Filled 2024-01-07: qty 100

## 2024-01-07 MED ORDER — ALBUMIN HUMAN 25 % IV SOLN: 25.0000 g | Freq: Once | INTRAVENOUS | Status: DC

## 2024-01-07 NOTE — Telephone Encounter (Signed)
 Copied from CRM [id number]. Topic: General - Other >> Jan 07, 2024  1:41 PM Saverio Danker wrote: Reason for CRM:  pj nurse from [facility] calling to get an hospice order faxed. and would also like to know if PCP would like to remain as attending and manage symptoms or allow the hospice doctors to provide care for patient     [phone]  fax#[phone]

## 2024-01-07 NOTE — Procedures (Signed)
 PROCEDURE SUMMARY: 83 year old female with history of NASH cirrhosis and recurrent ascites here today for therapeutic paracentesis. Last paracentesis on 12/25/23 with 8 Liters output. Per Dr. Jerrye Noble evaluation on 11/16/23, formal consult for TIPS could be considered. The patient's Hepatologist, Dr. Elsie Stain, was contacted for further discussion.   Successful image-guided paracentesis from the right abdomen.  Yielded 8 liters of yellow fluid.  No immediate complications.  EBL: trace Patient tolerated well.    Please see imaging section of Epic for full dictation.  Lowanda Foster Areona Homer NP 3:36 PM

## 2024-01-08 DIAGNOSIS — N289 Disorder of kidney and ureter, unspecified: Secondary | ICD-10-CM | POA: Diagnosis not present

## 2024-01-08 DIAGNOSIS — K7460 Unspecified cirrhosis of liver: Secondary | ICD-10-CM | POA: Diagnosis not present

## 2024-01-08 DIAGNOSIS — K7581 Nonalcoholic steatohepatitis (NASH): Secondary | ICD-10-CM | POA: Diagnosis not present

## 2024-01-08 DIAGNOSIS — R188 Other ascites: Secondary | ICD-10-CM | POA: Diagnosis not present

## 2024-01-08 NOTE — Addendum Note (Signed)
 Addended by: Doreene Nest on: 01/08/2024 07:09 AM   Modules accepted: Orders

## 2024-01-08 NOTE — Telephone Encounter (Signed)
 Referral placed in Epic for Hospice. Please allow hospice providers to assume care at this time. It will be better for the patient this way.

## 2024-01-08 NOTE — Telephone Encounter (Signed)
 Unable to reach pj from [facility]. Left voicemail to return call to our office.

## 2024-01-11 NOTE — Telephone Encounter (Signed)
 Unable to reach pj from [facility]. Left voicemail to return call to our office.

## 2024-01-13 NOTE — Telephone Encounter (Signed)
 Unable to reach pj from [facility]. Left voicemail to return call to our office.   3rd attempt to reach, will await return phone call.

## 2024-01-16 ENCOUNTER — Other Ambulatory Visit: Payer: Self-pay | Admitting: Primary Care

## 2024-01-16 DIAGNOSIS — R188 Other ascites: Secondary | ICD-10-CM

## 2024-01-16 DIAGNOSIS — K219 Gastro-esophageal reflux disease without esophagitis: Secondary | ICD-10-CM

## 2024-01-19 ENCOUNTER — Other Ambulatory Visit: Payer: Self-pay | Admitting: Nurse Practitioner

## 2024-01-19 DIAGNOSIS — R188 Other ascites: Secondary | ICD-10-CM

## 2024-01-19 NOTE — Progress Notes (Addendum)
 Marland Silvas, MD sent to Brynda Cara (RN) with [facility]  DDH

## 2024-01-19 NOTE — Progress Notes (Addendum)
 Roxie Cord, MD sent to Aggie Horton Approved.  HKM       Previous Messages    ----- Message ----- From: Aggie Horton Sent: 01/19/2024  12:51 PM EDT To: Roxie Cord, MD Subject: RE: IR Peritoneal Pleur-X Cath Insertion           She is with [facility] now - I just spoke with Renee the RN from there today. ----- Message ----- From: Roxie Cord, MD Sent: 01/19/2024  12:38 PM EDT To: Aggie Horton Subject: RE: IR Peritoneal Pleur-X Cath Insertion          Does pt have a plan for hospice? ----- Message ----- From: Aggie Horton Sent: 01/19/2024  10:26 AM EDT To: Ir Procedure Requests Subject: IR Peritoneal Pleur-X Cath Insertion              IR Approval Request:   Procedure:   IR Peritoneal Pleur-X Cath Insertion  Reason:       NASH cirrhosis and recurrent ascites  History:        Please see pt's chart  Provider:     Duey Ghent, NP  Provider Contact #     [facility]  [phone]

## 2024-01-20 NOTE — Progress Notes (Signed)
 Patient for IR Peritoneal Pleur-X Cath Insertion/w Para on Thurs 01/21/24, I called and spoke with the patient's husband, Salena Craven on the phone and gave pre-procedure instructions. Salena Craven was made aware to have the patient here at 7:30a, NPO after MN prior to procedure as well as driver post procedure/recovery/discharge. Lawrence stated understanding.  Called 01/19/24

## 2024-01-20 NOTE — H&P (Shared)
 Chief Complaint: Recurrent ascites; request for image guided peritoneal pleur-x catheter insertion  Referring Provider(s): Drazek,Dawn   Supervising Physician: Elene Griffes  Patient Status: [facility] - Out-pt  History of Present Illness: [name] is a 83 y.o. female with a history significant for NASH cirrhosis and recurrent ascites. She has required frequent paracenteses with IR, the most recent paracentesis punctures being 01/07/24 (8L), 12/25/23 (8L), 12/07/23 (8L), 11/16/23 (7.3L) with limited symptom control. She is not a TIPS candidate. She is now with [facility] with new IR request for a peritoneal pleur-x catheter to be placed for therapeutic peritoneal fluid drainage.   She has been NPO since MN except sips with meds, is accompanied by family, and is not on a blood thinner. She is feeling at her baseline today for when she has developed large volume ascites (mild nausea without emesis, abd fullness) and endorses chronic leg swelling. Denies fever, chills, SOB, abd pain, abnormal bruising, blood in stool or urine.    Patient is Full Code  Past Medical History:  Diagnosis Date   Acute non-recurrent maxillary sinusitis 05/17/2018   Acute non-recurrent sinusitis 05/17/2018   Adenocarcinoma, breast (HCC)    Anxiety    Colonic polyp    5 mm adenoma 1977   Degenerative joint disease    Fibromyalgia    Gastrointestinal hemorrhage with melena 05/28/2021   GERD (gastroesophageal reflux disease)    Hemorrhoids    History of anemia    History of shingles    Hyperlipidemia    Hypertension    Hypothyroidism    IBS (irritable bowel syndrome)    Malignant neoplasm of female breast (HCC) 10/21/2007   Formatting of this note might be different from the original. Invasive mammillary carcinoma left breast     NASH (nonalcoholic steatohepatitis)    Osteopenia    Personal history of chemotherapy 2005   Left Breast Cancer   Personal history of radiation therapy 2005   Left  Breast Cancer   UTI (urinary tract infection) 03/01/2015   Wears glasses     Past Surgical History:  Procedure Laterality Date   ABDOMINAL HYSTERECTOMY     APPENDECTOMY     BILATERAL SALPINGOOPHORECTOMY  1978   BREAST LUMPECTOMY Left 2005   left--node dissection 7/05 by Dr. Lucinda Saber   ESOPHAGOGASTRODUODENOSCOPY (EGD) WITH PROPOFOL  N/A 06/05/2021   Procedure: ESOPHAGOGASTRODUODENOSCOPY (EGD) WITH PROPOFOL ;  Surgeon: Annis Kinder, DO;  Location: [facility];  Service: Gastroenterology;  Laterality: N/A;   HEMOSTASIS CLIP PLACEMENT  06/05/2021   Procedure: HEMOSTASIS CLIP PLACEMENT;  Surgeon: Annis Kinder, DO;  Location: [facility];  Service: Gastroenterology;;   HOT HEMOSTASIS N/A 06/05/2021   Procedure: HOT HEMOSTASIS (ARGON PLASMA COAGULATION/BICAP);  Surgeon: Annis Kinder, DO;  Location: [facility];  Service: Gastroenterology;  Laterality: N/A;   IR THORACENTESIS ASP PLEURAL SPACE W/IMG GUIDE  07/31/2023   LAPAROSCOPIC CHOLECYSTECTOMY     POLYPECTOMY  06/05/2021   Procedure: POLYPECTOMY;  Surgeon: Annis Kinder, DO;  Location: [facility];  Service: Gastroenterology;;   ROTATOR CUFF REPAIR     left    Allergies: Ciprofloxacin , Codeine, and Morphine  Medications: Prior to Admission medications   Medication Sig Start Date End Date Taking? Authorizing Provider  benzonatate  (TESSALON ) 200 MG capsule Take 1 capsule (200 mg total) by mouth 3 (three) times daily as needed for cough. Patient not taking: Reported on 08/25/2023 08/05/23   Clark, Katherine K, NP  CALCIUM PO Take 1 tablet by mouth at bedtime.  [provider]  Cholecalciferol (VITAMIN D3) 125 MCG (5000 UT) TABS Take 5,000 Units by mouth at bedtime.    [provider]  escitalopram  (LEXAPRO ) 10 MG tablet TAKE 1 TABLET (10 MG TOTAL) BY MOUTH DAILY. FOR ANXIETY. Patient taking differently: Take 10 mg by mouth See admin instructions. Take 10 mg by mouth in the morning (for anxiety) 06/22/23    Clark, Katherine K, NP  ferrous sulfate 325 (65 FE) MG tablet Take 325 mg by mouth at bedtime.    [provider]  fluticasone  (FLONASE ) 50 MCG/ACT nasal spray Place 2 sprays into both nostrils daily. Patient not taking: Reported on 07/22/2023 10/07/22   Dorothe Gaster, NP  hydrOXYzine  (ATARAX /VISTARIL ) 10 MG tablet TAKE 1 TABLET BY MOUTH EVERY DAY AS NEEDED FOR ANXIETY Patient taking differently: Take 10 mg by mouth daily as needed for anxiety. 05/24/19   Clark, Katherine K, NP  levothyroxine  (SYNTHROID ) 75 MCG tablet TAKE 1 TABLET BY MOUTH EVERY MORNING ON AN EMPTY STOMACH WITH WATER ONLY. NO FOOD OR OTHER MEDICATIONS FOR 30 MINUTES. 09/03/23   Clark, Katherine K, NP  midodrine (PROAMATINE) 5 MG tablet Take 5 mg by mouth 3 (three) times daily with meals.    [provider]  Multiple Vitamins-Calcium (ONE-A-DAY WOMENS FORMULA) TABS Take 1 tablet by mouth at bedtime.    [provider]  omeprazole  (PRILOSEC) 40 MG capsule TAKE 1 CAPSULE (40 MG TOTAL) BY MOUTH IN THE MORNING AND AT BEDTIME. FOR HEARTBURN. 01/17/24   Gabriel John, NP     Family History  Problem Relation Age of Onset   Stroke Mother    Prostate cancer Father    Emphysema Sister    Crohn's disease Sister    Prostate cancer Brother    Aneurysm Brother    Liver disease Brother    Hypertension Brother     Social History   Socioeconomic History   Marital status: Married    Spouse name: Sammie Crigler   Number of children: 2   Years of education: Not on file   Highest education level: Not on file  Occupational History   Occupation: retired  Tobacco Use   Smoking status: Former    Current packs/day: 0.00    Average packs/day: 0.2 packs/day for 7.0 years (1.4 ttl pk-yrs)    Types: Cigarettes    Start date: 09/30/1963    Quit date: 09/29/1970    Years since quitting: 53.3   Smokeless tobacco: Never   Tobacco comments:    1 pack per week  Vaping Use   Vaping status: Never Used  Substance and Sexual  Activity   Alcohol use: Not Currently   Drug use: No   Sexual activity: Yes  Other Topics Concern   Not on file  Social History Narrative   Would desire CPR   Social Drivers of Health   Financial Resource Strain: Low Risk  (10/21/2022)   Overall Financial Resource Strain (CARDIA)    Difficulty of Paying Living Expenses: Not hard at all  Food Insecurity: Low Risk  (12/21/2023)   Received from [facility]   Hunger Vital Sign    Worried About Running Out of Food in the Last Year: Never true    Ran Out of Food in the Last Year: Never true  Transportation Needs: No Transportation Needs (12/21/2023)   Received from [facility]    In the past 12 months, has lack of reliable transportation kept you from medical appointments, meetings, work  or from getting things needed for daily living? : No  Physical Activity: Insufficiently Active (10/21/2022)   Exercise Vital Sign    Days of Exercise per Week: 3 days    Minutes of Exercise per Session: 30 min  Stress: No Stress Concern Present (10/21/2022)   [employer] of Occupational Health - Occupational Stress Questionnaire    Feeling of Stress : Not at all  Social Connections: Moderately Isolated (10/21/2022)   Social Connection and Isolation Panel [NHANES]    Frequency of Communication with Friends and Family: More than three times a week    Frequency of Social Gatherings with Friends and Family: More than three times a week    Attends Religious Services: Never    Database administrator or Organizations: No    Attends Banker Meetings: Never    Marital Status: Married     Review of Systems: A 12 point ROS discussed and pertinent positives are indicated in the HPI above.  All other systems are negative.    Vital Signs: BP 135/75   Pulse 74   Temp 97.8 F (36.6 C) (Oral)   Resp 15   SpO2 98%   Advance Care Plan: No documents on file    Physical Exam HENT:     Mouth/Throat:     Mouth: Mucous  membranes are moist.     Pharynx: Oropharynx is clear.  Cardiovascular:     Rate and Rhythm: Normal rate and regular rhythm.     Pulses: Normal pulses.     Heart sounds: Normal heart sounds.  Pulmonary:     Effort: Pulmonary effort is normal.     Comments: No adventitious BS but shallow inspiration Abdominal:     General: There is distension.     Palpations: Abdomen is soft.     Tenderness: There is abdominal tenderness.     Comments: Mild RUQ tenderness  Musculoskeletal:     Right lower leg: Edema present.     Left lower leg: Edema present.  Skin:    General: Skin is warm and dry.     Coloration: Skin is not jaundiced.     Comments: No rash or wound noted to planned area of puncture  Neurological:     Mental Status: She is alert and oriented to person, place, and time.  Psychiatric:        Mood and Affect: Mood normal.        Behavior: Behavior normal.        Thought Content: Thought content normal.        Judgment: Judgment normal.     Imaging: US  Paracentesis Result Date: 01/07/2024 INDICATION: 83 year old female with history of NASH cirrhosis and recurrent ascites here today for therapeutic paracentesis. Last paracentesis on 12/25/23 with 8 Liters output. EXAM: ULTRASOUND GUIDED THERAPEUTIC PARACENTESIS MEDICATIONS: 8 mL 1% lidocaine  COMPLICATIONS: None immediate. PROCEDURE: Informed written consent was obtained from the patient after a discussion of the risks, benefits and alternatives to treatment. A timeout was performed prior to the initiation of the procedure. Initial ultrasound scanning demonstrates a large amount of ascites within the right lower abdominal quadrant. The right lower abdomen was prepped and draped in the usual sterile fashion. 1% lidocaine  was used for local anesthesia. Following this, a 19 gauge, 7-cm, Yueh catheter was introduced. An ultrasound image was saved for documentation purposes. The paracentesis was performed. The catheter was removed and a  dressing was applied. The patient tolerated the procedure well without immediate  post procedural complication. Patient received post-procedure intravenous albumin ; see nursing notes for details. FINDINGS: A total of approximately 8 liters of yellow fluid was removed. IMPRESSION: Successful ultrasound-guided paracentesis yielding 8 liters of peritoneal fluid. PLAN: Per Dr. Aleen Huron evaluation on 11/16/23, formal consult for TIPS could be considered. The patient's hepatologist, Dr. Chauncey Cora, was contacted for further discussion. Performed by Terressa Fess, NP Electronically Signed   By: Myrlene Asper D.O.   On: 01/07/2024 16:16   US  Abdomen Limited RUQ (LIVER/GB) Result Date: 01/04/2024 CLINICAL DATA:  LIVER CIRRHOSIS SECONDARY NASH,HEPATIC ENCEPHALOPATHY,SECONDARY ESOPHAGEAL VARICES WITHOUT BLEEDING EXAM: ULTRASOUND ABDOMEN LIMITED RIGHT UPPER QUADRANT COMPARISON:  December 29, 2022, 07/08/2022 FINDINGS: Gallbladder: Cholecystectomy. Common bile duct: Diameter: 5 mm Liver: Shrunken nodular liver with coarsened echogenicity. No visualized intrahepatic mass. Portal vein is patent on color Doppler imaging with normal direction of blood flow towards the liver. Other: Large volume ascites. Cortical thinning and increased echogenicity of the right kidney. No visualized hydronephrosis. IMPRESSION: 1. Cirrhotic liver with large volume ascites, consistent with portal hypertension. Continued biannual HCC surveillance recommended. 2. Right renal changes consistent with chronic medical renal disease. Electronically Signed   By: Rance Burrows M.D.   On: 01/04/2024 17:46   US  Paracentesis Result Date: 12/25/2023 INDICATION: 83 year old female with history of NASH cirrhosis and recurrent ascites here today for therapeutic paracentesis. Last paracentesis on 12/07/23 with 8 Liters output. Per Dr. Aleen Huron evaluation on 11/16/23, formal consult for TIPS could be considered. The patient's Hepatologist, Dr. Chauncey Cora, was contacted for  further discussion. EXAM: ULTRASOUND GUIDED THERAPEUTIC PARACENTESIS MEDICATIONS: 9mL 1% lidocaine  COMPLICATIONS: None immediate. PROCEDURE: Informed written consent was obtained from the patient after a discussion of the risks, benefits and alternatives to treatment. A timeout was performed prior to the initiation of the procedure. Initial ultrasound scanning demonstrates a large amount of ascites within the right lower abdominal quadrant. The right lower abdomen was prepped and draped in the usual sterile fashion. 1% lidocaine  was used for local anesthesia. Following this, a 19 gauge, 7-cm, Yueh catheter was introduced. An ultrasound image was saved for documentation purposes. The paracentesis was performed. The catheter was removed and a dressing was applied. The patient tolerated the procedure well without immediate post procedural complication. Patient received post-procedure intravenous albumin ; see nursing notes for details. FINDINGS: A total of approximately 8 Liters of yellow fluid was removed. Samples were sent to the laboratory as requested by the clinical team. IMPRESSION: Successful ultrasound-guided paracentesis yielding 8 liters of peritoneal fluid. PLAN: Per Dr. Aleen Huron evaluation on 11/16/23 formal consult for TIPS could be considered. The patient's Hepatologist, Dr. Chauncey Cora, was contacted for further discussion. Performed by Terressa Fess, NP Electronically Signed   By: Erica Hau M.D.   On: 12/25/2023 12:59   MM 3D DIAGNOSTIC MAMMOGRAM UNILATERAL LEFT BREAST Result Date: 12/24/2023 CLINICAL DATA:  Recall from screening for a possible left breast asymmetry. Patient has a history of a left lumpectomy for breast carcinoma performed in 2005. EXAM: DIGITAL DIAGNOSTIC UNILATERAL LEFT MAMMOGRAM WITH TOMOSYNTHESIS AND CAD TECHNIQUE: Left digital diagnostic mammography and breast tomosynthesis was performed. The images were evaluated with computer-aided detection. COMPARISON:  Previous exam(s).  ACR Breast Density Category b: There are scattered areas of fibroglandular density. FINDINGS: On spot compression imaging, the possible asymmetry that was noted in the upper left breast on 1 of the 2 screening MLO views disperses consistent with superimposed fibroglandular tissue. There is no underlying mass, distortion or significant residual asymmetry. IMPRESSION: 1. No evidence of breast malignancy.  2. Benign post lumpectomy changes on the left, stable. RECOMMENDATION: Screening mammogram in one year.(Code:SM-B-01Y) I have discussed the findings and recommendations with the patient. If applicable, a reminder letter will be sent to the patient regarding the next appointment. BI-RADS CATEGORY  2: Benign. Electronically Signed   By: Amanda Jungling M.D.   On: 12/24/2023 13:36    Labs:  CBC: Recent Labs    06/09/23 1439 07/22/23 1246  WBC 6.8 9.5  HGB 12.4 12.1  HCT 37.9 36.7  PLT 137.0* 216    COAGS: Recent Labs    07/03/23 1250 07/22/23 1246  INR 1.5* 1.5*    BMP: Recent Labs    06/09/23 1439 07/03/23 1250 07/22/23 1345  NA 135 132* 135  K 4.2 4.4 3.2*  CL 103 102 114*  CO2 22 21 17*  GLUCOSE 91 132* 91  BUN 25* 21 17  CALCIUM 9.7 9.1 7.1*  CREATININE 1.66* 1.40* 1.00  GFRNONAA  --   --  56*    LIVER FUNCTION TESTS: Recent Labs    06/09/23 1439 07/03/23 1250 07/22/23 1345  BILITOT 1.2 1.8* 1.2  AST 31 39* 31  ALT 23 32 19  ALKPHOS 84 115 90  PROT 6.8 7.0 5.8*  ALBUMIN  3.5 3.0* 2.0*    TUMOR MARKERS: No results for input(s): "AFPTM", "CEA", "CA199", "CHROMGRNA" in the last 8760 hours.  Assessment and Plan:  Request for image guided peritoneal Pleur-x catheter placement: -not on a blood thinner -meets requirements for moderate sedation (NPO, supervision/ride, no resp/airway concerns) -VSS -large volume ascites requiring frequent paracenteses noted in records, abd distention suggests large volume today -labs within acceptable range on 12/25/23, no  indication of significant clinical change since then -no contraindications for procedure noted in ROS or PE -she will have access to home nursing care to manage drainage via pleur-x  Risks and benefits discussed with the patient and family including bleeding, infection, damage to adjacent structures, malfunction of the catheter with need for additional procedures.  All of the patient's questions were answered, patient is agreeable to proceed. Consent signed and in chart.    Thank you for allowing our service to participate in BRYANN GENTZ 's care.    Electronically Signed: Terressa Fess, NP   01/21/2024, 8:52 AM     I spent a total of  30 Minutes   in face to face in clinical consultation, greater than 50% of which was counseling/coordinating care for image guided peritoneal pleur-x catheter placement   (A copy of this note was sent to the referring provider and the time of visit.)

## 2024-01-21 ENCOUNTER — Encounter: Payer: Self-pay | Admitting: Radiology

## 2024-01-21 ENCOUNTER — Ambulatory Visit
Admission: RE | Admit: 2024-01-21 | Discharge: 2024-01-21 | Disposition: A | Discharge status: Home / Self Care | Source: Ambulatory Visit | Attending: Nurse Practitioner | Admitting: Nurse Practitioner

## 2024-01-21 ENCOUNTER — Other Ambulatory Visit: Payer: Self-pay

## 2024-01-21 DIAGNOSIS — K7581 Nonalcoholic steatohepatitis (NASH): Secondary | ICD-10-CM | POA: Insufficient documentation

## 2024-01-21 DIAGNOSIS — R188 Other ascites: Secondary | ICD-10-CM | POA: Diagnosis present

## 2024-01-21 DIAGNOSIS — K7460 Unspecified cirrhosis of liver: Secondary | ICD-10-CM | POA: Diagnosis not present

## 2024-01-21 HISTORY — PX: IR PERC TUN PERIT CATH WO PORT S&I /IMAG: IMG2327

## 2024-01-21 MED ORDER — DIAZEPAM 5 MG/ML IJ SOLN
INTRAMUSCULAR | Status: AC | PRN
Start: 2024-01-21 — End: 2024-01-21
  Administered 2024-01-21: 2.5 mg via INTRAVENOUS

## 2024-01-21 MED ORDER — MIDAZOLAM HCL 2 MG/2ML IJ SOLN
INTRAMUSCULAR | Status: AC
  Filled 2024-01-21: qty 2

## 2024-01-21 MED ORDER — MIDAZOLAM HCL 5 MG/5ML IJ SOLN
INTRAMUSCULAR | Status: AC | PRN
  Administered 2024-01-21 (×2): .5 mg via INTRAVENOUS

## 2024-01-21 MED ORDER — SODIUM CHLORIDE 0.9 % IV SOLN: INTRAVENOUS | Status: DC

## 2024-01-21 MED ORDER — MIDAZOLAM HCL 2 MG/2ML IJ SOLN
INTRAMUSCULAR | Status: AC | PRN
  Administered 2024-01-21: 1 mg via INTRAVENOUS

## 2024-01-21 MED ORDER — LIDOCAINE 1 % OPTIME INJ - NO CHARGE
10.0000 mL | Freq: Once | INTRAMUSCULAR | Status: AC
  Administered 2024-01-21: 10 mL via INTRADERMAL
  Filled 2024-01-21: qty 10

## 2024-01-21 MED ORDER — CEFAZOLIN SODIUM-DEXTROSE 2-4 GM/100ML-% IV SOLN
INTRAVENOUS | Status: AC | PRN
  Administered 2024-01-21: 2 g via INTRAVENOUS

## 2024-01-21 MED ORDER — FENTANYL CITRATE (PF) 100 MCG/2ML IJ SOLN
INTRAMUSCULAR | Status: AC | PRN
  Administered 2024-01-21 (×2): 25 ug via INTRAVENOUS
  Administered 2024-01-21: 50 ug via INTRAVENOUS

## 2024-01-21 MED ORDER — FENTANYL CITRATE (PF) 100 MCG/2ML IJ SOLN
INTRAMUSCULAR | Status: AC
  Filled 2024-01-21: qty 2

## 2024-01-21 MED ORDER — CEFAZOLIN SODIUM-DEXTROSE 2-4 GM/100ML-% IV SOLN
INTRAVENOUS | Status: AC
  Filled 2024-01-21: qty 100

## 2024-01-21 NOTE — Procedures (Signed)
 Interventional Radiology Procedure:   Indications: Cirrhosis with recurrent large volume ascites.  Drain requested for hospice care.   Procedure: Placement of tunneled peritoneal drain  Findings: Pleurx placed on left side of the abdomen.  7.4 liters of yellow fluid removed.   Complications: None     EBL: Minimal  Plan: Discharge to home.  Pleurx care per hospice.   Magie Ciampa R. Julietta Ogren, MD  Pager: [phone]

## 2024-01-21 NOTE — Progress Notes (Signed)
 Patient clinically stable post IR Abdominal pleurx catheter placement per DR Julietta Ogren, drained 7.4 liters yellow fluid post procedure. Received Versed  2 mg along with Fentanyl  100 mcg IV for procedure. Denies complaints post procedure. Report given to [facility]/Brittany RN post procedure/specials/4

## 2024-03-29 DEATH — deceased
